# Patient Record
Sex: Female | Born: 1935
Health system: Southern US, Community
[De-identification: ages and names within clinical notes are randomized; demographics above are authoritative.]

## PROBLEM LIST (undated history)

## (undated) DIAGNOSIS — R32 Unspecified urinary incontinence: Secondary | ICD-10-CM

## (undated) DIAGNOSIS — K219 Gastro-esophageal reflux disease without esophagitis: Secondary | ICD-10-CM

## (undated) DIAGNOSIS — F419 Anxiety disorder, unspecified: Secondary | ICD-10-CM

## (undated) DIAGNOSIS — R0602 Shortness of breath: Secondary | ICD-10-CM

## (undated) DIAGNOSIS — G4730 Sleep apnea, unspecified: Secondary | ICD-10-CM

## (undated) DIAGNOSIS — M797 Fibromyalgia: Secondary | ICD-10-CM

## (undated) DIAGNOSIS — R112 Nausea with vomiting, unspecified: Secondary | ICD-10-CM

## (undated) DIAGNOSIS — J40 Bronchitis, not specified as acute or chronic: Secondary | ICD-10-CM

## (undated) DIAGNOSIS — R42 Dizziness and giddiness: Secondary | ICD-10-CM

## (undated) DIAGNOSIS — K589 Irritable bowel syndrome without diarrhea: Secondary | ICD-10-CM

## (undated) DIAGNOSIS — N289 Disorder of kidney and ureter, unspecified: Secondary | ICD-10-CM

## (undated) DIAGNOSIS — E039 Hypothyroidism, unspecified: Secondary | ICD-10-CM

## (undated) DIAGNOSIS — M1990 Unspecified osteoarthritis, unspecified site: Secondary | ICD-10-CM

## (undated) DIAGNOSIS — Z9889 Other specified postprocedural states: Secondary | ICD-10-CM

## (undated) DIAGNOSIS — D649 Anemia, unspecified: Secondary | ICD-10-CM

## (undated) DIAGNOSIS — J189 Pneumonia, unspecified organism: Secondary | ICD-10-CM

## (undated) DIAGNOSIS — R3915 Urgency of urination: Secondary | ICD-10-CM

## (undated) DIAGNOSIS — G43909 Migraine, unspecified, not intractable, without status migrainosus: Secondary | ICD-10-CM

## (undated) DIAGNOSIS — E109 Type 1 diabetes mellitus without complications: Secondary | ICD-10-CM

## (undated) DIAGNOSIS — I10 Essential (primary) hypertension: Secondary | ICD-10-CM

## (undated) DIAGNOSIS — E079 Disorder of thyroid, unspecified: Secondary | ICD-10-CM

## (undated) DIAGNOSIS — M810 Age-related osteoporosis without current pathological fracture: Secondary | ICD-10-CM

## (undated) DIAGNOSIS — R5382 Chronic fatigue, unspecified: Secondary | ICD-10-CM

## (undated) HISTORY — DX: Type 1 diabetes mellitus without complications: E10.9

## (undated) HISTORY — DX: Age-related osteoporosis without current pathological fracture: M81.0

## (undated) HISTORY — PX: JOINT REPLACEMENT: SHX530

## (undated) HISTORY — PX: COLONOSCOPY W/ POLYPECTOMY: SHX1380

## (undated) HISTORY — PX: ABDOMINAL HYSTERECTOMY: SHX81

## (undated) HISTORY — DX: Disorder of kidney and ureter, unspecified: N28.9

## (undated) HISTORY — PX: DILATION AND CURETTAGE OF UTERUS: SHX78

## (undated) HISTORY — PX: EYE SURGERY: SHX253

## (undated) HISTORY — PX: CHOLECYSTECTOMY: SHX55

## (undated) HISTORY — PX: TONSILLECTOMY: SUR1361

## (undated) HISTORY — PX: APPENDECTOMY: SHX54

---

## 2004-02-17 ENCOUNTER — Other Ambulatory Visit: Admission: RE | Admit: 2004-02-17 | Discharge: 2004-02-17 | Payer: Self-pay | Admitting: Dermatology

## 2005-12-17 ENCOUNTER — Ambulatory Visit: Payer: Self-pay | Admitting: Gastroenterology

## 2005-12-27 ENCOUNTER — Ambulatory Visit (HOSPITAL_COMMUNITY): Admission: RE | Admit: 2005-12-27 | Discharge: 2005-12-27 | Payer: Self-pay | Admitting: Ophthalmology

## 2011-02-01 DIAGNOSIS — I517 Cardiomegaly: Secondary | ICD-10-CM

## 2011-02-01 DIAGNOSIS — R072 Precordial pain: Secondary | ICD-10-CM

## 2011-05-28 DIAGNOSIS — G619 Inflammatory polyneuropathy, unspecified: Secondary | ICD-10-CM | POA: Insufficient documentation

## 2011-09-11 ENCOUNTER — Encounter: Payer: Self-pay | Admitting: *Deleted

## 2011-09-11 ENCOUNTER — Emergency Department (HOSPITAL_COMMUNITY)
Admission: EM | Admit: 2011-09-11 | Discharge: 2011-09-12 | Disposition: A | Discharge status: Home / Self Care | Payer: Medicare Other | Attending: Emergency Medicine | Admitting: Emergency Medicine

## 2011-09-11 DIAGNOSIS — W19XXXA Unspecified fall, initial encounter: Secondary | ICD-10-CM

## 2011-09-11 DIAGNOSIS — W268XXA Contact with other sharp object(s), not elsewhere classified, initial encounter: Secondary | ICD-10-CM | POA: Insufficient documentation

## 2011-09-11 DIAGNOSIS — W010XXA Fall on same level from slipping, tripping and stumbling without subsequent striking against object, initial encounter: Secondary | ICD-10-CM | POA: Insufficient documentation

## 2011-09-11 DIAGNOSIS — Z79899 Other long term (current) drug therapy: Secondary | ICD-10-CM | POA: Insufficient documentation

## 2011-09-11 DIAGNOSIS — IMO0002 Reserved for concepts with insufficient information to code with codable children: Secondary | ICD-10-CM

## 2011-09-11 DIAGNOSIS — M542 Cervicalgia: Secondary | ICD-10-CM | POA: Insufficient documentation

## 2011-09-11 DIAGNOSIS — S0990XA Unspecified injury of head, initial encounter: Secondary | ICD-10-CM | POA: Insufficient documentation

## 2011-09-11 DIAGNOSIS — W01119A Fall on same level from slipping, tripping and stumbling with subsequent striking against unspecified sharp object, initial encounter: Secondary | ICD-10-CM | POA: Insufficient documentation

## 2011-09-11 DIAGNOSIS — S0100XA Unspecified open wound of scalp, initial encounter: Secondary | ICD-10-CM | POA: Insufficient documentation

## 2011-09-11 DIAGNOSIS — E119 Type 2 diabetes mellitus without complications: Secondary | ICD-10-CM | POA: Insufficient documentation

## 2011-09-11 HISTORY — DX: Chronic fatigue, unspecified: R53.82

## 2011-09-11 HISTORY — DX: Fibromyalgia: M79.7

## 2011-09-11 MED ORDER — ACETAMINOPHEN 500 MG PO TABS: 1000.0000 mg | ORAL_TABLET | Freq: Once | ORAL | Status: DC

## 2011-09-11 NOTE — ED Notes (Signed)
Pt tripped over her dog. Pt hit head on a corner of sink.

## 2011-09-11 NOTE — ED Notes (Signed)
Wound cleaned stapler to bedside. Pt stating no loc

## 2011-09-12 ENCOUNTER — Emergency Department (HOSPITAL_COMMUNITY): Payer: Medicare Other

## 2011-09-12 MED ORDER — LIDOCAINE HCL (PF) 1 % IJ SOLN
INTRAMUSCULAR | Status: AC
  Filled 2011-09-12: qty 5

## 2011-09-12 MED ORDER — LIDOCAINE HCL (PF) 1 % IJ SOLN: 5.0000 mL | Freq: Once | INTRAMUSCULAR | Status: DC

## 2011-09-12 NOTE — ED Provider Notes (Signed)
History     CSN: [id number] Arrival date & time: 09/11/2011 11:25 PM  Chief Complaint  Patient presents with  . Laceration  . Neck Pain    (Consider location/radiation/quality/duration/timing/severity/associated sxs/prior treatment) HPI Comments: Examined at 0001.  Patient is a 75 y.o. female presenting with skin laceration and neck pain. The history is provided by the patient.  Laceration  The incident occurred less than 1 hour ago. The laceration is located on the scalp. The laceration is 6 cm in size. Injury mechanism: tripped over her dog and fell hitting the back of her head on the edge of the wall near her sink. The pain is at a severity of 6/10. The pain is moderate. The pain has been constant since onset. She reports no foreign bodies present. Her tetanus status is UTD.  Neck Pain     Past Medical History  Diagnosis Date  . Diabetes mellitus   . Fibromyalgia   . Chronic fatigue     Past Surgical History  Procedure Date  . Appendectomy   . Abdominal hysterectomy   . Cholecystectomy     History reviewed. No pertinent family history.  History  Substance Use Topics  . Smoking status: Former Games developer  . Smokeless tobacco: Not on file  . Alcohol Use: No    OB History    Grav Para Term Preterm Abortions TAB SAB Ect Mult Living                  Review of Systems  HENT: Positive for neck pain.   All other systems reviewed and are negative.    Allergies  Cleocin; Glucophage; Macrodantin; Prednisone; and Sulfa antibiotics  Home Medications   Current Outpatient Rx  Name Route Sig Dispense Refill  . ASPIRIN 81 MG PO TABS Oral Take 81 mg by mouth daily.      Marland Kitchen GLIMEPIRIDE 2 MG PO TABS Oral Take 2 mg by mouth daily before breakfast.      . LEVOTHYROXINE SODIUM 75 MCG PO TABS Oral Take 75 mcg by mouth daily.      Marland Kitchen LOSARTAN POTASSIUM 25 MG PO TABS Oral Take 25 mg by mouth daily.      Marland Kitchen METFORMIN HCL 500 MG (MOD) PO TB24 Oral Take 500 mg by mouth daily with  breakfast.      . METOPROLOL TARTRATE 25 MG PO TABS Oral Take 25 mg by mouth 2 (two) times daily.      Marland Kitchen RABEPRAZOLE SODIUM 20 MG PO TBEC Oral Take 20 mg by mouth daily.        BP 140/71  Pulse 71  Resp 20  Ht 5\' 5"  (1.651 m)  Wt 175 lb (79.379 kg)  BMI 29.12 kg/m2  SpO2 99%  Physical Exam  Nursing note and vitals reviewed. Constitutional: She is oriented to person, place, and time. She appears well-developed and well-nourished.  HENT:  Head: Normocephalic.  Right Ear: External ear normal.  Left Ear: External ear normal.  Mouth/Throat: Oropharynx is clear and moist.       6 cm  Vertical laceration to occiput, bleeding controlled.  Eyes: EOM are normal. Pupils are equal, round, and reactive to light.  Neck: Normal range of motion. Neck supple. No thyromegaly present.  Cardiovascular: Normal rate, normal heart sounds and intact distal pulses.   Pulmonary/Chest: Effort normal.  Abdominal: Soft.  Musculoskeletal: Normal range of motion.  Lymphadenopathy:    She has no cervical adenopathy.  Neurological: She is alert and oriented to person,  place, and time. No cranial nerve deficit. Coordination normal.  Skin: Skin is warm and dry.    ED Course  LACERATION REPAIR Performed by: Annamarie Dawley Authorized by: Annamarie Dawley Consent: Verbal consent obtained. Risks and benefits: risks, benefits and alternatives were discussed Consent given by: patient Patient understanding: patient states understanding of the procedure being performed Imaging studies: imaging studies available Patient identity confirmed: verbally with patient and arm band Time out: Immediately prior to procedure a "time out" was called to verify the correct patient, procedure, equipment, support staff and site/side marked as required. Body area: head/neck Location details: scalp Laceration length: 6 cm Tendon involvement: none Nerve involvement: none Anesthesia: local infiltration Local anesthetic:  lidocaine 1% without epinephrine Patient sedated: no Irrigation solution: saline Amount of cleaning: standard Debridement: none Degree of undermining: none Patient tolerance: Patient tolerated the procedure well with no immediate complications. Comments: Staples x 4 placed   (including critical care time)  Labs Reviewed - No data to display Ct Head Wo Contrast  09/12/2011  *RADIOLOGY REPORT*  Clinical Data:  Fall, laceration to back of head, headache, neck pain  CT HEAD WITHOUT CONTRAST CT CERVICAL SPINE WITHOUT CONTRAST  Technique:  Multidetector CT imaging of the head and cervical spine was performed following the standard protocol without intravenous contrast.  Multiplanar CT image reconstructions of the cervical spine were also generated.  Comparison:  None.  CT HEAD  Findings: No evidence of parenchymal hemorrhage or extra-axial fluid collection. No mass lesion, mass effect, or midline shift.  No CT evidence of acute infarction.  Cerebral volume is age appropriate.  No ventriculomegaly.  Intracranial atherosclerosis.  The visualized paranasal sinuses are essentially clear. The mastoid air cells are unopacified.  Mild soft tissue laceration overlying the left occiput (series 2/image 13).  No underlying osseous abnormality.  No evidence of calvarial fracture.  IMPRESSION: No evidence of acute intracranial abnormality.  CT CERVICAL SPINE  Findings: Normal cervical lordosis.  No evidence of fracture or dislocation.  The vertebral body heights are maintained.  The dens appears intact.  No prevertebral soft tissue swelling.  Mild degenerative changes at C5-6 and C6-7.  Visualized thyroid is unremarkable.  Visualized lung apices are clear.  IMPRESSION: No evidence of traumatic injury to the cervical spine.  Original Report Authenticated By: Charline Bills, M.D.   Ct Cervical Spine Wo Contrast  09/12/2011  *RADIOLOGY REPORT*  Clinical Data:  Fall, laceration to back of head, headache, neck pain  CT  HEAD WITHOUT CONTRAST CT CERVICAL SPINE WITHOUT CONTRAST  Technique:  Multidetector CT imaging of the head and cervical spine was performed following the standard protocol without intravenous contrast.  Multiplanar CT image reconstructions of the cervical spine were also generated.  Comparison:  None.  CT HEAD  Findings: No evidence of parenchymal hemorrhage or extra-axial fluid collection. No mass lesion, mass effect, or midline shift.  No CT evidence of acute infarction.  Cerebral volume is age appropriate.  No ventriculomegaly.  Intracranial atherosclerosis.  The visualized paranasal sinuses are essentially clear. The mastoid air cells are unopacified.  Mild soft tissue laceration overlying the left occiput (series 2/image 13).  No underlying osseous abnormality.  No evidence of calvarial fracture.  IMPRESSION: No evidence of acute intracranial abnormality.  CT CERVICAL SPINE  Findings: Normal cervical lordosis.  No evidence of fracture or dislocation.  The vertebral body heights are maintained.  The dens appears intact.  No prevertebral soft tissue swelling.  Mild degenerative changes at C5-6 and C6-7.  Visualized thyroid is unremarkable.  Visualized lung apices are clear.  IMPRESSION: No evidence of traumatic injury to the cervical spine.  Original Report Authenticated By: Charline Bills, M.D.     No diagnosis found.  Patient fell tripping over her dog striking her head on the edge of a sink cabinet. No LOC. Sustained a laceration to the occiput. CT scans of head and cervical spine unremarkable. Laceration repaired.  MDM Reviewed: nursing note and vitals Interpretation: CT scan              Nicoletta Dress. Colon Branch, MD 09/12/11 8119

## 2011-09-12 NOTE — ED Notes (Signed)
Pt left the er stating no needs 

## 2011-09-22 DIAGNOSIS — S0100XA Unspecified open wound of scalp, initial encounter: Secondary | ICD-10-CM | POA: Insufficient documentation

## 2011-12-03 DIAGNOSIS — R0602 Shortness of breath: Secondary | ICD-10-CM | POA: Insufficient documentation

## 2012-02-12 DIAGNOSIS — D485 Neoplasm of uncertain behavior of skin: Secondary | ICD-10-CM | POA: Insufficient documentation

## 2012-04-17 DIAGNOSIS — R059 Cough, unspecified: Secondary | ICD-10-CM | POA: Insufficient documentation

## 2012-06-19 ENCOUNTER — Encounter (HOSPITAL_COMMUNITY): Payer: Self-pay | Admitting: Pharmacy Technician

## 2012-06-22 ENCOUNTER — Encounter (HOSPITAL_COMMUNITY)
Admission: RE | Admit: 2012-06-22 | Discharge: 2012-06-22 | Disposition: A | Discharge status: Home / Self Care | Payer: Medicare Other | Source: Ambulatory Visit | Attending: Orthopedic Surgery | Admitting: Orthopedic Surgery

## 2012-06-22 ENCOUNTER — Encounter (HOSPITAL_COMMUNITY)
Admission: RE | Admit: 2012-06-22 | Discharge: 2012-06-22 | Disposition: A | Discharge status: Home / Self Care | Payer: Medicare Other | Source: Ambulatory Visit | Attending: Anesthesiology | Admitting: Anesthesiology

## 2012-06-22 ENCOUNTER — Encounter (HOSPITAL_COMMUNITY): Payer: Self-pay

## 2012-06-22 HISTORY — DX: Irritable bowel syndrome, unspecified: K58.9

## 2012-06-22 HISTORY — DX: Disorder of thyroid, unspecified: E07.9

## 2012-06-22 HISTORY — DX: Gastro-esophageal reflux disease without esophagitis: K21.9

## 2012-06-22 HISTORY — DX: Migraine, unspecified, not intractable, without status migrainosus: G43.909

## 2012-06-22 HISTORY — DX: Pneumonia, unspecified organism: J18.9

## 2012-06-22 HISTORY — DX: Sleep apnea, unspecified: G47.30

## 2012-06-22 HISTORY — DX: Unspecified urinary incontinence: R32

## 2012-06-22 HISTORY — DX: Bronchitis, not specified as acute or chronic: J40

## 2012-06-22 HISTORY — DX: Essential (primary) hypertension: I10

## 2012-06-22 HISTORY — DX: Nausea with vomiting, unspecified: R11.2

## 2012-06-22 HISTORY — DX: Anemia, unspecified: D64.9

## 2012-06-22 HISTORY — DX: Other specified postprocedural states: Z98.890

## 2012-06-22 LAB — CBC
HCT: 35.4 % — ABNORMAL LOW (ref 36.0–46.0)
MCV: 82.1 fL (ref 78.0–100.0)
RBC: 4.31 MIL/uL (ref 3.87–5.11)
WBC: 7.3 10*3/uL (ref 4.0–10.5)

## 2012-06-22 LAB — TYPE AND SCREEN

## 2012-06-22 LAB — SURGICAL PCR SCREEN: MRSA, PCR: NEGATIVE

## 2012-06-22 LAB — ABO/RH: ABO/RH(D): A POS

## 2012-06-22 LAB — BASIC METABOLIC PANEL
CO2: 26 mEq/L (ref 19–32)
Chloride: 100 mEq/L (ref 96–112)
Sodium: 138 mEq/L (ref 135–145)

## 2012-06-22 NOTE — Pre-Procedure Instructions (Signed)
20 Angelica Wood  06/22/2012   Your procedure is scheduled on:  Thursday June 29, 2012  Report to [facility] Short Stay Center at 5:30 AM.  Call this number if you have problems the morning of surgery: [phone]   Remember:   Do not eat food  Or drink :After Midnight.    Take these medicines the morning of surgery with A SIP OF WATER: cymbalta, synthroid, claritin, metoprolol, percocet, aciphex   Do not wear jewelry, make-up or nail polish.  Do not wear lotions, powders, or perfumes. You may wear deodorant.  Do not shave 48 hours prior to surgery. Men may shave face and neck.  Do not bring valuables to the hospital.  Contacts, dentures or bridgework may not be worn into surgery.  Leave suitcase in the car. After surgery it may be brought to your room.  For patients admitted to the hospital, checkout time is 11:00 AM the day of discharge.   Patients discharged the day of surgery will not be allowed to drive home.  Name and phone number of your driver: Venida Jarvis [phone]  Special Instructions: Incentive Spirometry - Practice and bring it with you on the day of surgery. and CHG Shower Use Special Wash: 1/2 bottle night before surgery and 1/2 bottle morning of surgery.   Please read over the following fact sheets that you were given: Pain Booklet, Coughing and Deep Breathing, Blood Transfusion Information, MRSA Information and Surgical Site Infection Prevention

## 2012-06-22 NOTE — Progress Notes (Signed)
Requested sleep study from [facility].

## 2012-06-26 NOTE — Consult Note (Signed)
Anesthesia Chart Review:  Patient is a 76 year old female scheduled for a left total shoulder on 06/29/12 by Dr. Rennis Chris.  History includes OSA, post-operative N/V, former smoker, DM2, HTN, IBS, migraines, anemia, chronic fatigue, GERD, fibromyalgia, urinary incontinence, PNA, hypothyroidism.  Labs noted.  Non-fasting glucose is 237.  H/H 11.2/35.4.  She will get a CBG on arrival.  There were no acute findings on CXR from 06/22/12.    EKG on 06/22/12 showed NSR.  Plan to proceed.  Shonna Chock, PA-C

## 2012-06-28 MED ORDER — DEXTROSE 5 % IV SOLN
3.0000 g | INTRAVENOUS | Status: AC
  Administered 2012-06-29: 3 g via INTRAVENOUS
  Filled 2012-06-28: qty 30

## 2012-06-28 MED ORDER — CHLORHEXIDINE GLUCONATE 4 % EX LIQD: 60.0000 mL | Freq: Once | CUTANEOUS | Status: DC

## 2012-06-29 ENCOUNTER — Encounter (HOSPITAL_COMMUNITY): Payer: Self-pay | Admitting: Vascular Surgery

## 2012-06-29 ENCOUNTER — Inpatient Hospital Stay (HOSPITAL_COMMUNITY)
Admission: RE | Admit: 2012-06-29 | Discharge: 2012-06-30 | DRG: 484 | Disposition: A | Discharge status: Home / Self Care | Payer: Medicare Other | Source: Ambulatory Visit | Attending: Orthopedic Surgery | Admitting: Orthopedic Surgery

## 2012-06-29 ENCOUNTER — Encounter (HOSPITAL_COMMUNITY)
Admission: RE | Disposition: A | Discharge status: Home / Self Care | Payer: Self-pay | Source: Ambulatory Visit | Attending: Orthopedic Surgery

## 2012-06-29 ENCOUNTER — Ambulatory Visit (HOSPITAL_COMMUNITY): Payer: Medicare Other | Admitting: Vascular Surgery

## 2012-06-29 DIAGNOSIS — K219 Gastro-esophageal reflux disease without esophagitis: Secondary | ICD-10-CM | POA: Diagnosis present

## 2012-06-29 DIAGNOSIS — E119 Type 2 diabetes mellitus without complications: Secondary | ICD-10-CM | POA: Diagnosis present

## 2012-06-29 DIAGNOSIS — F411 Generalized anxiety disorder: Secondary | ICD-10-CM | POA: Diagnosis present

## 2012-06-29 DIAGNOSIS — I10 Essential (primary) hypertension: Secondary | ICD-10-CM | POA: Diagnosis present

## 2012-06-29 DIAGNOSIS — G4730 Sleep apnea, unspecified: Secondary | ICD-10-CM | POA: Diagnosis present

## 2012-06-29 DIAGNOSIS — Z01818 Encounter for other preprocedural examination: Secondary | ICD-10-CM

## 2012-06-29 DIAGNOSIS — Z87891 Personal history of nicotine dependence: Secondary | ICD-10-CM

## 2012-06-29 DIAGNOSIS — IMO0001 Reserved for inherently not codable concepts without codable children: Secondary | ICD-10-CM | POA: Diagnosis present

## 2012-06-29 DIAGNOSIS — Z888 Allergy status to other drugs, medicaments and biological substances status: Secondary | ICD-10-CM

## 2012-06-29 DIAGNOSIS — M19019 Primary osteoarthritis, unspecified shoulder: Principal | ICD-10-CM

## 2012-06-29 DIAGNOSIS — Z882 Allergy status to sulfonamides status: Secondary | ICD-10-CM

## 2012-06-29 HISTORY — PX: TOTAL SHOULDER ARTHROPLASTY: SHX126

## 2012-06-29 LAB — GLUCOSE, CAPILLARY
Glucose-Capillary: 135 mg/dL — ABNORMAL HIGH (ref 70–99)
Glucose-Capillary: 138 mg/dL — ABNORMAL HIGH (ref 70–99)
Glucose-Capillary: 97 mg/dL (ref 70–99)
Glucose-Capillary: 137 mg/dL — ABNORMAL HIGH (ref 70–99)
Glucose-Capillary: 271 mg/dL — ABNORMAL HIGH (ref 70–99)

## 2012-06-29 SURGERY — ARTHROPLASTY, SHOULDER, TOTAL
Anesthesia: General | Site: Shoulder | Laterality: Left | Wound class: Clean

## 2012-06-29 MED ORDER — METOCLOPRAMIDE HCL 10 MG PO TABS: 5.0000 mg | ORAL_TABLET | Freq: Three times a day (TID) | ORAL | Status: DC | PRN

## 2012-06-29 MED ORDER — LOSARTAN POTASSIUM-HCTZ 100-12.5 MG PO TABS: 1.0000 | ORAL_TABLET | Freq: Every day | ORAL | Status: DC

## 2012-06-29 MED ORDER — NEOSTIGMINE METHYLSULFATE 1 MG/ML IJ SOLN
INTRAMUSCULAR | Status: DC | PRN
  Administered 2012-06-29: 2 mg via INTRAVENOUS

## 2012-06-29 MED ORDER — OXYCODONE-ACETAMINOPHEN 5-325 MG PO TABS
1.0000 | ORAL_TABLET | ORAL | Status: DC | PRN
  Administered 2012-06-29: 2 via ORAL
  Administered 2012-06-30 (×2): 1 via ORAL
  Filled 2012-06-29 (×2): qty 1
  Filled 2012-06-29: qty 2

## 2012-06-29 MED ORDER — MENTHOL 3 MG MT LOZG
1.0000 | LOZENGE | OROMUCOSAL | Status: DC | PRN
  Filled 2012-06-29: qty 9

## 2012-06-29 MED ORDER — MUPIROCIN 2 % EX OINT
TOPICAL_OINTMENT | Freq: Two times a day (BID) | CUTANEOUS | Status: DC
  Administered 2012-06-29 – 2012-06-30 (×3): via NASAL
  Filled 2012-06-29: qty 22

## 2012-06-29 MED ORDER — ONDANSETRON HCL 4 MG/2ML IJ SOLN
INTRAMUSCULAR | Status: DC | PRN
  Administered 2012-06-29: 4 mg via INTRAVENOUS

## 2012-06-29 MED ORDER — ONDANSETRON HCL 4 MG/2ML IJ SOLN: 4.0000 mg | Freq: Once | INTRAMUSCULAR | Status: DC | PRN

## 2012-06-29 MED ORDER — PHENOL 1.4 % MT LIQD
1.0000 | OROMUCOSAL | Status: DC | PRN
Start: 2012-06-29 — End: 2012-06-30

## 2012-06-29 MED ORDER — HYDROCHLOROTHIAZIDE 12.5 MG PO CAPS
12.5000 mg | ORAL_CAPSULE | Freq: Every day | ORAL | Status: DC
  Administered 2012-06-30: 12.5 mg via ORAL
  Filled 2012-06-29: qty 1

## 2012-06-29 MED ORDER — ZOLPIDEM TARTRATE 5 MG PO TABS: 5.0000 mg | ORAL_TABLET | Freq: Every evening | ORAL | Status: DC | PRN

## 2012-06-29 MED ORDER — DULOXETINE HCL 60 MG PO CPEP
60.0000 mg | ORAL_CAPSULE | Freq: Two times a day (BID) | ORAL | Status: DC
  Administered 2012-06-29 – 2012-06-30 (×2): 60 mg via ORAL
  Filled 2012-06-29 (×3): qty 1

## 2012-06-29 MED ORDER — METOCLOPRAMIDE HCL 5 MG/ML IJ SOLN
5.0000 mg | Freq: Three times a day (TID) | INTRAMUSCULAR | Status: DC | PRN
Start: 2012-06-29 — End: 2012-06-30

## 2012-06-29 MED ORDER — PHENYLEPHRINE HCL 10 MG/ML IJ SOLN
INTRAMUSCULAR | Status: DC | PRN
  Administered 2012-06-29 (×2): 120 ug via INTRAVENOUS
  Administered 2012-06-29: 80 ug via INTRAVENOUS

## 2012-06-29 MED ORDER — INSULIN ASPART 100 UNIT/ML ~~LOC~~ SOLN
0.0000 [IU] | Freq: Three times a day (TID) | SUBCUTANEOUS | Status: DC
  Administered 2012-06-29: 8 [IU] via SUBCUTANEOUS
  Administered 2012-06-30: 2 [IU] via SUBCUTANEOUS

## 2012-06-29 MED ORDER — LIDOCAINE HCL (CARDIAC) 20 MG/ML IV SOLN
INTRAVENOUS | Status: DC | PRN
  Administered 2012-06-29: 80 mg via INTRAVENOUS

## 2012-06-29 MED ORDER — METOPROLOL TARTRATE 50 MG PO TABS
50.0000 mg | ORAL_TABLET | Freq: Two times a day (BID) | ORAL | Status: DC
  Administered 2012-06-29 – 2012-06-30 (×2): 50 mg via ORAL
  Filled 2012-06-29 (×3): qty 1

## 2012-06-29 MED ORDER — LEVOTHYROXINE SODIUM 100 MCG PO TABS
100.0000 ug | ORAL_TABLET | Freq: Every day | ORAL | Status: DC
  Administered 2012-06-30: 100 ug via ORAL
  Filled 2012-06-29 (×2): qty 1

## 2012-06-29 MED ORDER — GLYCOPYRROLATE 0.2 MG/ML IJ SOLN
INTRAMUSCULAR | Status: DC | PRN
  Administered 2012-06-29: 0.2 mg via INTRAVENOUS
  Administered 2012-06-29: 0.3 mg via INTRAVENOUS

## 2012-06-29 MED ORDER — LORATADINE 10 MG PO TABS
10.0000 mg | ORAL_TABLET | Freq: Every day | ORAL | Status: DC
  Administered 2012-06-30: 10 mg via ORAL
  Filled 2012-06-29: qty 1

## 2012-06-29 MED ORDER — GLIMEPIRIDE 4 MG PO TABS
4.0000 mg | ORAL_TABLET | Freq: Every day | ORAL | Status: DC
  Administered 2012-06-30: 4 mg via ORAL
  Filled 2012-06-29 (×2): qty 1

## 2012-06-29 MED ORDER — LACTATED RINGERS IV SOLN: INTRAVENOUS | Status: DC

## 2012-06-29 MED ORDER — CHLORHEXIDINE GLUCONATE CLOTH 2 % EX PADS: 6.0000 | MEDICATED_PAD | Freq: Every day | CUTANEOUS | Status: DC

## 2012-06-29 MED ORDER — MIDAZOLAM HCL 5 MG/5ML IJ SOLN
INTRAMUSCULAR | Status: DC | PRN
  Administered 2012-06-29: 2 mg via INTRAVENOUS

## 2012-06-29 MED ORDER — HYDROMORPHONE HCL PF 1 MG/ML IJ SOLN: 0.2500 mg | INTRAMUSCULAR | Status: DC | PRN

## 2012-06-29 MED ORDER — LOSARTAN POTASSIUM 50 MG PO TABS
100.0000 mg | ORAL_TABLET | Freq: Every day | ORAL | Status: DC
  Administered 2012-06-30: 100 mg via ORAL
  Filled 2012-06-29: qty 2

## 2012-06-29 MED ORDER — PANTOPRAZOLE SODIUM 40 MG PO TBEC
40.0000 mg | DELAYED_RELEASE_TABLET | Freq: Every day | ORAL | Status: DC
  Administered 2012-06-30: 40 mg via ORAL
  Filled 2012-06-29: qty 1

## 2012-06-29 MED ORDER — ACETAMINOPHEN 325 MG PO TABS: 650.0000 mg | ORAL_TABLET | Freq: Four times a day (QID) | ORAL | Status: DC | PRN

## 2012-06-29 MED ORDER — DIPHENHYDRAMINE HCL 12.5 MG/5ML PO ELIX: 12.5000 mg | ORAL_SOLUTION | ORAL | Status: DC | PRN

## 2012-06-29 MED ORDER — LACTATED RINGERS IV SOLN
INTRAVENOUS | Status: DC | PRN
  Administered 2012-06-29 (×2): via INTRAVENOUS

## 2012-06-29 MED ORDER — ACETAMINOPHEN 650 MG RE SUPP: 650.0000 mg | Freq: Four times a day (QID) | RECTAL | Status: DC | PRN

## 2012-06-29 MED ORDER — CEFAZOLIN SODIUM 1-5 GM-% IV SOLN
1.0000 g | Freq: Four times a day (QID) | INTRAVENOUS | Status: AC
  Administered 2012-06-29 – 2012-06-30 (×3): 1 g via INTRAVENOUS
  Filled 2012-06-29 (×3): qty 50

## 2012-06-29 MED ORDER — KETOROLAC TROMETHAMINE 15 MG/ML IJ SOLN
15.0000 mg | Freq: Four times a day (QID) | INTRAMUSCULAR | Status: DC
  Administered 2012-06-29 – 2012-06-30 (×4): 15 mg via INTRAVENOUS
  Filled 2012-06-29 (×7): qty 1

## 2012-06-29 MED ORDER — ONDANSETRON HCL 4 MG/2ML IJ SOLN: 4.0000 mg | Freq: Four times a day (QID) | INTRAMUSCULAR | Status: DC | PRN

## 2012-06-29 MED ORDER — DOCUSATE SODIUM 100 MG PO CAPS
100.0000 mg | ORAL_CAPSULE | Freq: Two times a day (BID) | ORAL | Status: DC
  Administered 2012-06-29 – 2012-06-30 (×3): 100 mg via ORAL
  Filled 2012-06-29 (×3): qty 1

## 2012-06-29 MED ORDER — ROCURONIUM BROMIDE 100 MG/10ML IV SOLN
INTRAVENOUS | Status: DC | PRN
  Administered 2012-06-29: 30 mg via INTRAVENOUS

## 2012-06-29 MED ORDER — HYDROMORPHONE HCL PF 1 MG/ML IJ SOLN
0.5000 mg | INTRAMUSCULAR | Status: DC | PRN
  Administered 2012-06-30 (×2): 1 mg via INTRAVENOUS
  Filled 2012-06-29 (×2): qty 1

## 2012-06-29 MED ORDER — ONDANSETRON HCL 4 MG PO TABS: 4.0000 mg | ORAL_TABLET | Freq: Four times a day (QID) | ORAL | Status: DC | PRN

## 2012-06-29 MED ORDER — FENTANYL CITRATE 0.05 MG/ML IJ SOLN
INTRAMUSCULAR | Status: DC | PRN
  Administered 2012-06-29 (×3): 50 ug via INTRAVENOUS

## 2012-06-29 MED ORDER — SODIUM CHLORIDE 0.9 % IV SOLN
10.0000 mg | INTRAVENOUS | Status: DC | PRN
  Administered 2012-06-29: 10 ug/min via INTRAVENOUS

## 2012-06-29 MED ORDER — BUPIVACAINE-EPINEPHRINE PF 0.5-1:200000 % IJ SOLN
INTRAMUSCULAR | Status: DC | PRN
  Administered 2012-06-29: 30 mL

## 2012-06-29 MED ORDER — LORAZEPAM 0.5 MG PO TABS: 0.5000 mg | ORAL_TABLET | Freq: Three times a day (TID) | ORAL | Status: DC | PRN

## 2012-06-29 MED ORDER — PROPOFOL 10 MG/ML IV EMUL
INTRAVENOUS | Status: DC | PRN
  Administered 2012-06-29: 130 mg via INTRAVENOUS

## 2012-06-29 MED ORDER — SODIUM CHLORIDE 0.9 % IR SOLN
Status: DC | PRN
  Administered 2012-06-29: 1000 mL

## 2012-06-29 SURGICAL SUPPLY — 61 items
BLADE SAW SGTL 83.5X18.5 (BLADE) ×2 IMPLANT
CEMENT BONE DEPUY (Cement) ×2 IMPLANT
CLOTH BEACON ORANGE TIMEOUT ST (SAFETY) ×2 IMPLANT
CLSR STERI-STRIP ANTIMIC 1/2X4 (GAUZE/BANDAGES/DRESSINGS) ×2 IMPLANT
COVER SURGICAL LIGHT HANDLE (MISCELLANEOUS) ×2 IMPLANT
DRAPE INCISE IOBAN 66X45 STRL (DRAPES) ×2 IMPLANT
DRAPE SURG 17X11 SM STRL (DRAPES) ×2 IMPLANT
DRAPE SURG 17X23 STRL (DRAPES) ×2 IMPLANT
DRAPE U-SHAPE 47X51 STRL (DRAPES) ×2 IMPLANT
DRILL BIT 7/64X5 (BIT) IMPLANT
DRSG AQUACEL AG ADV 3.5X10 (GAUZE/BANDAGES/DRESSINGS) IMPLANT
DRSG MEPILEX BORDER 4X8 (GAUZE/BANDAGES/DRESSINGS) IMPLANT
DURAPREP 26ML APPLICATOR (WOUND CARE) ×2 IMPLANT
ELECT BLADE 4.0 EZ CLEAN MEGAD (MISCELLANEOUS) ×2
ELECT CAUTERY BLADE 6.4 (BLADE) ×2 IMPLANT
ELECT REM PT RETURN 9FT ADLT (ELECTROSURGICAL) ×2
ELECTRODE BLDE 4.0 EZ CLN MEGD (MISCELLANEOUS) ×1 IMPLANT
ELECTRODE REM PT RTRN 9FT ADLT (ELECTROSURGICAL) ×1 IMPLANT
FACESHIELD LNG OPTICON STERILE (SAFETY) ×6 IMPLANT
GLOVE BIO SURGEON STRL SZ7.5 (GLOVE) ×2 IMPLANT
GLOVE BIO SURGEON STRL SZ8 (GLOVE) ×2 IMPLANT
GLOVE BIO SURGEON STRL SZ8.5 (GLOVE) ×2 IMPLANT
GLOVE EUDERMIC 7 POWDERFREE (GLOVE) ×2 IMPLANT
GLOVE SS BIOGEL STRL SZ 7.5 (GLOVE) ×1 IMPLANT
GLOVE SUPERSENSE BIOGEL SZ 7.5 (GLOVE) ×1
GLOVE SURG SS PI 8.5 STRL IVOR (GLOVE) ×1
GLOVE SURG SS PI 8.5 STRL STRW (GLOVE) ×1 IMPLANT
GOWN STRL NON-REIN LRG LVL3 (GOWN DISPOSABLE) IMPLANT
GOWN STRL REIN XL XLG (GOWN DISPOSABLE) ×6 IMPLANT
KIT BASIN OR (CUSTOM PROCEDURE TRAY) ×2 IMPLANT
KIT ROOM TURNOVER OR (KITS) ×2 IMPLANT
MANIFOLD NEPTUNE II (INSTRUMENTS) ×2 IMPLANT
NDL SUT 6 .5 CRC .975X.05 MAYO (NEEDLE) ×1 IMPLANT
NEEDLE HYPO 25GX1X1/2 BEV (NEEDLE) IMPLANT
NEEDLE MAYO TAPER (NEEDLE) ×1
NS IRRIG 1000ML POUR BTL (IV SOLUTION) ×2 IMPLANT
PACK SHOULDER (CUSTOM PROCEDURE TRAY) ×2 IMPLANT
PAD ARMBOARD 7.5X6 YLW CONV (MISCELLANEOUS) ×4 IMPLANT
PASSER SUT SWANSON 36MM LOOP (INSTRUMENTS) IMPLANT
PIN METAGLENE 2.5 (PIN) IMPLANT
SLING ARM FOAM STRAP MED (SOFTGOODS) ×2 IMPLANT
SLING ARM IMMOBILIZER LRG (SOFTGOODS) IMPLANT
SMARTMIX MINI TOWER (MISCELLANEOUS) ×2
SPONGE LAP 18X18 X RAY DECT (DISPOSABLE) ×2 IMPLANT
SPONGE LAP 4X18 X RAY DECT (DISPOSABLE) ×2 IMPLANT
STRIP CLOSURE SKIN 1/2X4 (GAUZE/BANDAGES/DRESSINGS) IMPLANT
SUCTION FRAZIER TIP 10 FR DISP (SUCTIONS) ×2 IMPLANT
SUT BONE WAX W31G (SUTURE) IMPLANT
SUT FIBERWIRE #2 38 T-5 BLUE (SUTURE) ×4
SUT MNCRL AB 3-0 PS2 18 (SUTURE) ×2 IMPLANT
SUT VIC AB 1 CT1 27 (SUTURE) ×1
SUT VIC AB 1 CT1 27XBRD ANBCTR (SUTURE) ×1 IMPLANT
SUT VIC AB 2-0 CT1 27 (SUTURE) ×1
SUT VIC AB 2-0 CT1 TAPERPNT 27 (SUTURE) ×1 IMPLANT
SUTURE FIBERWR #2 38 T-5 BLUE (SUTURE) ×2 IMPLANT
SYR 30ML SLIP (SYRINGE) IMPLANT
SYR CONTROL 10ML LL (SYRINGE) IMPLANT
TOWEL OR 17X24 6PK STRL BLUE (TOWEL DISPOSABLE) ×2 IMPLANT
TOWEL OR 17X26 10 PK STRL BLUE (TOWEL DISPOSABLE) ×2 IMPLANT
TOWER SMARTMIX MINI (MISCELLANEOUS) ×1 IMPLANT
WATER STERILE IRR 1000ML POUR (IV SOLUTION) IMPLANT

## 2012-06-29 NOTE — Anesthesia Preprocedure Evaluation (Addendum)
Anesthesia Evaluation  Patient identified by MRN, date of birth, ID band Patient awake    Reviewed: Allergy & Precautions, H&P , NPO status , Patient's Chart, lab work & pertinent test results  History of Anesthesia Complications (+) PONV  Airway Mallampati: I TM Distance: >3 FB Neck ROM: full    Dental  (+) Teeth Intact and Dental Advisory Given   Pulmonary sleep apnea ,          Cardiovascular hypertension, Rhythm:regular Rate:Normal     Neuro/Psych  Headaches, PSYCHIATRIC DISORDERS  Neuromuscular disease    GI/Hepatic GERD-  ,  Endo/Other  Type 2, Oral Hypoglycemic Agents  Renal/GU      Musculoskeletal   Abdominal   Peds  Hematology   Anesthesia Other Findings   Reproductive/Obstetrics                          Anesthesia Physical Anesthesia Plan  ASA: III  Anesthesia Plan: General   Post-op Pain Management:    Induction: Intravenous  Airway Management Planned: Oral ETT  Additional Equipment:   Intra-op Plan:   Post-operative Plan: Extubation in OR  Informed Consent: I have reviewed the patients History and Physical, chart, labs and discussed the procedure including the risks, benefits and alternatives for the proposed anesthesia with the patient or authorized representative who has indicated his/her understanding and acceptance.     Plan Discussed with: CRNA, Anesthesiologist and Surgeon  Anesthesia Plan Comments:         Anesthesia Quick Evaluation

## 2012-06-29 NOTE — Preoperative (Signed)
Beta Blockers   Reason not to administer Beta Blockers:Not Applicable, last dose 06/29/12 at 0400

## 2012-06-29 NOTE — Transfer of Care (Signed)
Immediate Anesthesia Transfer of Care Note  Patient: Angelica [name]  Procedure(s) Performed: Procedure(s) (LRB): TOTAL SHOULDER ARTHROPLASTY (Left)  Patient Location: PACU  Anesthesia Type: General  Level of Consciousness: awake, alert  and oriented  Airway & Oxygen Therapy: Patient Spontanous Breathing and Patient connected to nasal cannula oxygen  Post-op Assessment: Report given to PACU RN  Post vital signs: Reviewed and stable  Complications: No apparent anesthesia complications

## 2012-06-29 NOTE — Anesthesia Postprocedure Evaluation (Signed)
  Anesthesia Post-op Note  Patient: Angelica [name]  Procedure(s) Performed: Procedure(s) (LRB): TOTAL SHOULDER ARTHROPLASTY (Left)  Patient Location: PACU  Anesthesia Type: GA combined with regional for post-op pain  Level of Consciousness: awake, alert , oriented and patient cooperative  Airway and Oxygen Therapy: Patient Spontanous Breathing and Patient connected to nasal cannula oxygen  Post-op Pain: mild  Post-op Assessment: Post-op Vital signs reviewed, Patient's Cardiovascular Status Stable, Respiratory Function Stable, Patent Airway, No signs of Nausea or vomiting and Pain level controlled  Post-op Vital Signs: stable  Complications: No apparent anesthesia complications

## 2012-06-29 NOTE — H&P (Signed)
Elvera Lennox    Chief Complaint: OA left shoulder HPI: The patient is a 76 y.o. female with end stage left shoulder arthritis  Past Medical History  Diagnosis Date  . Diabetes mellitus   . Chronic fatigue   . PONV (postoperative nausea and vomiting)   . Hypertension   . Thyroid disease   . Pneumonia     hx of  . Bronchitis     hx of  . Sleep apnea     uses cpap sparingly  . Urinary incontinence   . Irritable bowel syndrome (IBS)     hx of  . GERD (gastroesophageal reflux disease)   . Migraine     hx of  . Fibromyalgia   . Anemia     hx of    Past Surgical History  Procedure Date  . Appendectomy   . Abdominal hysterectomy   . Cholecystectomy   . Tonsillectomy   . Eye surgery     right cataract removed, laser surgery for glaucoma  . Dilation and curettage of uterus     No family history on file.  Social History:  reports that she has quit smoking. She does not have any smokeless tobacco history on file. She reports that she does not drink alcohol or use illicit drugs.  Allergies:  Allergies  Allergen Reactions  . Glucophage (Metformin Hydrochloride) Diarrhea  . Macrodantin (Nitrofurantoin Macrocrystal) Other (See Comments)    Whelps    . Prednisone Nausea And Vomiting  . Sulfa Antibiotics Other (See Comments)    Whelps    . Clindamycin Hcl Rash    All over torso     Medications Prior to Admission  Medication Sig Dispense Refill  . aspirin 81 MG tablet Take 81 mg by mouth daily.        . Cholecalciferol (VITAMIN D3) 5000 UNITS CAPS Take 1 capsule by mouth 2 (two) times daily.      . DULoxetine (CYMBALTA) 60 MG capsule Take 60 mg by mouth 2 (two) times daily.      Marland Kitchen glimepiride (AMARYL) 4 MG tablet Take 4 mg by mouth daily before breakfast.      . levothyroxine (SYNTHROID, LEVOTHROID) 100 MCG tablet Take 100 mcg by mouth daily.      Marland Kitchen loratadine (CLARITIN) 10 MG tablet Take 10 mg by mouth daily.      Marland Kitchen losartan-hydrochlorothiazide (HYZAAR) 100-12.5 MG  per tablet Take 1 tablet by mouth daily.      . metFORMIN (GLUCOPHAGE) 500 MG tablet Take 500 mg by mouth daily.      . metoprolol (LOPRESSOR) 50 MG tablet Take 50 mg by mouth 2 (two) times daily.      . Omega-3 Fatty Acids (OMEGA 3 PO) Take 1 capsule by mouth daily.      Marland Kitchen OVER THE COUNTER MEDICATION Take 1 tablet by mouth 3 (three) times daily. Zyflamend tablets antiinflammatory      . oxyCODONE-acetaminophen (PERCOCET) 5-325 MG per tablet Take 1 tablet by mouth 4 (four) times daily as needed. For pain      . RABEprazole (ACIPHEX) 20 MG tablet Take 20 mg by mouth 2 (two) times daily.       . Saxagliptin-Metformin (KOMBIGLYZE XR) 5-500 MG TB24 Take 1 tablet by mouth daily.         Physical Exam: left shoulder with painful ROM and exam as noted at 6/26 13 office visit  Vitals  Temp:  [98 F (36.7 C)] 98 F (36.7 C) (07/18  6213) Pulse Rate:  [56] 56  (07/18 0611) Resp:  [18] 18  (07/18 0611) BP: (156)/(72) 156/72 mmHg (07/18 0611) SpO2:  [99 %] 99 % (07/18 0611)  Assessment/Plan  Impression: OA left shoulder  Plan of Action: Procedure(s): TOTAL SHOULDER ARTHROPLASTY  Brissa Asante M 06/29/2012, 7:34 AM

## 2012-06-29 NOTE — Op Note (Signed)
[name], [name] NO.:  [id number]  MEDICAL RECORD NO.:  [id number]  LOCATION:  [facility]                         FACILITY:  [facility]  PHYSICIAN:  Vania Rea. Lacosta Hargan, M.D.  DATE OF BIRTH:  August 09, 1936  DATE OF PROCEDURE:  06/29/2012 DATE OF DISCHARGE:                              OPERATIVE REPORT   PREOPERATIVE DIAGNOSIS:  End-stage left shoulder osteoarthrosis.  POSTOPERATIVE DIAGNOSIS:  End-stage left shoulder osteoarthrosis.  PROCEDURE:  Left total shoulder arthroplasty utilizing a press-fit size 12 DePuy global stem, a 48 x 21 eccentric humeral head and a 44 cemented pegged glenoid.  SURGEON:  Vania Rea. Tyaire Odem, MD  ASSISTANT:  Lucita Lora. Shuford, PA-C  ANESTHESIA:  General endotracheal as well as an interscalene block.  BLOOD LOSS:  200 mL.  DRAINS:  None.  HISTORY:  Angelica [name] is a 76 year old female who has had chronic and progressive increasing bilateral shoulder pain related to arthrosis, left symptomatically greater than the right.  Due to ongoing pain and function limitation, she is brought to the operating room at this time for planned left total shoulder arthroplasty.  Preoperatively counseled Angelica [name] on treatment options as well as risks versus benefits thereof.  Possible surgical complications were reviewed including the potential for bleeding, infection, neurovascular injury, persistent pain, loss of motion, anesthetic complication, failure of the implant, possible need for additional surgery.  She understands and accepts and agrees to the planned procedure.  PROCEDURE IN DETAIL:  After undergoing routine preop evaluation, the patient received prophylactic antibiotics and interscalene block was established in the holding area by the Anesthesia Department.  Placed supine on the operating table after induction of a general endotracheal anesthesia.  Placed in beach-chair position and appropriately padded and protected.  The left shoulder girdle  region was then sterilely prepped and draped in standard fashion.  Time-out was called.  An anterior approach to the left shoulder was made through a 15 cm deltopectoral incision.  Skin flaps were elevated.  Electrocautery was used for hemostasis.  The deltopectoral interval was identified.  Cephalic vein retracted laterally at the deltoid, interval was then developed distally and the upper centimeter of pec major was tenotomized to improve exposure.  Conjoined tendon was then mobilized, retracted medially, and adhesions in the subdeltoid bursa divided for improved exposure.  We then identified the bicipital groove, tenotomized biceps tendon for later tenodesis.  We then split horizontally along the rotator interval, and then divided the subscap from the lesser tuberosity leaving 1 cm cuff of tissue for later repair.  The free margin of the subscap was intact with a series of grasping #2 FiberWire sutures.  We then dissected the capsule with soft tissue insertions from the inferior aspect of the humeral neck allowing complete exposure and dislocation of the humeral head to the wound, retracting the subscap medially and dividing the capsule back past the 6 o'clock position around to 8 o'clock.  Rotator cuff was inspected and found to be intact. Extramedullary guide was then used to outline our proposed humeral head resection and this was then performed with an oscillating saw protecting the rotator cuff.  Sequential hand reaming was then performed to the humeral canal  up to size 12 and then we placed the cookie cutter broach and broached up to size 12 maintaining approximately 30 degrees of retroversion and maintaining the nadir of the degree of retroversion. We then used a rongeur to remove large osteophytes that had developed on the humeral head anteriorly and inferiorly.  At this point, a metal cap was then placed across the cut surface of the proximal humerus and we exposed the glenoid  with a series of Fukuda pitchfork and snake tongue retractors.  Performed a anterior capsular release, mobilized the subscap, and then performed a circumferential excision of the labral tissue including residual proximal stump of the biceps and gained complete circumferential exposure of the glenoid.  This size was the most closely fit a 44.  A central guidepin was placed, we reamed the glenoid fossa to a smooth surface correcting a slight degree of retroversion and then removed residual peripheral bone with a rongeur. We then placed a central drill hole and then using the appropriate guide the peripheral peg holes.  Trial of 44 showed excellent fit.  The glenoid was irrigated.  Cement was mixed in appropriate consistency.  We injected cement into the peripheral peg holes.  We then placed the final size 44 pegged glenoid, impacting with excellent fit fixation.  The cement was allowed harden.  We then returned our attention to the proximal humerus where the canal was irrigated, dried, and then we impacted the size 12 stem using bone graft harvested from the resected humeral head into the metaphyseal region obtaining excellent fit and fixation.  We then performed trial reductions and the 48 x 21 eccentric head showed the best soft tissue balance.  The final +48 x 21 eccentric head was then impacted after the [facility] taper was meticulously cleaned and dried.  Final reduction was performed.  The humeral head showed 50% translation across the glenoid and good soft tissue balance.  At this point, confirmed the subscap had been properly mobilized and repaired it back to the lesser tuberosity.  The soft tissue repair of the cuff of tissue with #2 FiberWires with excellent reapposition and stability and rotator interval was then closed lateral 3 cm with interrupted figure-of- eight FiberWire sutures.  The biceps tendon was then tenodesed in the bicipital groove to adjacent soft tissues with #2  FiberWire.  Shoulder showed excellent motion and stability at this point.  The wound was irrigated.  Hemostasis was obtained.  The deltopectoral interval was then reapproximated with 0-Vicryl sutures, 2-0 Vicryl was used for the subcu layer and intracuticular 3-0 Monocryl for the skin followed by Steri-Strips.  Dry dressing was applied.  Left arm was placed in a sling.  The patient was awakened, extubated, and taken to the recovery room in a stable condition.     Vania Rea. Sherwin Hollingshed, M.D.     KMS/MEDQ  D:  06/29/2012  T:  06/29/2012  Job:  [id number]

## 2012-06-29 NOTE — Progress Notes (Signed)
Report to Kay RN

## 2012-06-29 NOTE — Anesthesia Procedure Notes (Addendum)
Anesthesia Regional Block:  Interscalene brachial plexus block  Pre-Anesthetic Checklist: ,, timeout performed, Correct Patient, Correct Site, Correct Laterality, Correct Procedure, Correct Position, site marked, Risks and benefits discussed,  Surgical consent,  Pre-op evaluation,  At surgeon's request and post-op pain management  Laterality: Left  Prep: chloraprep       Needles:  Injection technique: Single-shot  Needle Type: Echogenic Stimulator Needle     Needle Length: 5cm 5 cm Needle Gauge: 22 and 22 G    Additional Needles:  Procedures: ultrasound guided and nerve stimulator Interscalene brachial plexus block  Nerve Stimulator or Paresthesia:  Response: biceps flexion, 0.45 mA,   Additional Responses:   Narrative:  Start time: 06/29/2012 7:15 AM End time: 06/29/2012 7:33 AM Injection made incrementally with aspirations every 5 mL.  Performed by: Personally  Anesthesiologist: Dr Chaney Malling  Additional Notes: Functioning IV was confirmed and monitors were applied.  A 50mm 22ga Arrow echogenic stimulator needle was used. Sterile prep and drape,hand hygiene and sterile gloves were used.  Negative aspiration and negative test dose prior to incremental administration of local anesthetic. The patient tolerated the procedure well.  Ultrasound guidance: relevent anatomy identified, needle position confirmed, local anesthetic spread visualized around nerve(s), vascular puncture avoided.  Image printed for medical record.   Interscalene brachial plexus block Procedure Name: Intubation Date/Time: 06/29/2012 7:53 AM Performed by: Jefm Miles E Pre-anesthesia Checklist: Patient identified, Timeout performed, Emergency Drugs available, Suction available and Patient being monitored Patient Re-evaluated:Patient Re-evaluated prior to inductionOxygen Delivery Method: Circle system utilized Preoxygenation: Pre-oxygenation with 100% oxygen Intubation Type: IV induction Laryngoscope  Size: Mac and 3 Grade View: Grade I Tube type: Oral Tube size: 7.0 mm Number of attempts: 1 Airway Equipment and Method: Stylet Placement Confirmation: ETT inserted through vocal cords under direct vision,  breath sounds checked- equal and bilateral and positive ETCO2 Secured at: 20 cm Tube secured with: Tape Dental Injury: Teeth and Oropharynx as per pre-operative assessment     Anesthesia Regional Block:   Narrative:

## 2012-06-29 NOTE — Op Note (Signed)
06/29/2012  9:35 AM  PATIENT:   Angelica [name]  76 y.o. female  PRE-OPERATIVE DIAGNOSIS:  OA left shoulder  POST-OPERATIVE DIAGNOSIS:  same  PROCEDURE:  L TSA #12 stem, 48X21 eccentric head, 44 glenoid  SURGEON:  Lace Chenevert, Vania Rea M.D.  ASSISTANTS: Shuford pac   ANESTHESIA:   GET + ISB  EBL: 200  SPECIMEN:  none  Drains: none   PATIENT DISPOSITION:  PACU - hemodynamically stable.    PLAN OF CARE: Admit to inpatient   Dictation# [id number]

## 2012-06-30 ENCOUNTER — Encounter (HOSPITAL_COMMUNITY): Payer: Self-pay | Admitting: Orthopedic Surgery

## 2012-06-30 DIAGNOSIS — M259 Joint disorder, unspecified: Secondary | ICD-10-CM | POA: Insufficient documentation

## 2012-06-30 DIAGNOSIS — M19019 Primary osteoarthritis, unspecified shoulder: Secondary | ICD-10-CM

## 2012-06-30 LAB — GLUCOSE, CAPILLARY
Glucose-Capillary: 120 mg/dL — ABNORMAL HIGH (ref 70–99)
Glucose-Capillary: 257 mg/dL — ABNORMAL HIGH (ref 70–99)

## 2012-06-30 MED ORDER — OXYCODONE-ACETAMINOPHEN 5-325 MG PO TABS: 1.0000 | ORAL_TABLET | ORAL | Status: AC | PRN

## 2012-06-30 MED ORDER — DIAZEPAM 5 MG PO TABS: 5.0000 mg | ORAL_TABLET | Freq: Four times a day (QID) | ORAL | Status: AC | PRN

## 2012-06-30 NOTE — Progress Notes (Signed)
Occupational Therapy Treatment Patient Details Name: RAJAH LAMBA MRN: [id number] DOB: [birth year] Today's Date: 06/30/2012 Time: 5784-6962 OT Time Calculation (min): 52 min  OT Assessment / Plan / Recommendation Comments on Treatment Session Education continued    Follow Up Recommendations   (HHPT and 24hr supervision)    Barriers to Discharge       Equipment Recommendations  None recommended by OT    Recommendations for Other Services    Frequency Min 2X/week   Plan      Precautions / Restrictions Precautions Precautions: Shoulder Type of Shoulder Precautions: left.  Required Braces or Orthoses:  (left UE sling) Restrictions Weight Bearing Restrictions: Yes LUE Weight Bearing: Non weight bearing   Pertinent Vitals/Pain Pt reports 5/10 lt shoulder pain; pre-medicated    ADL  Grooming: Performed;Minimal assistance Where Assessed - Grooming: Unsupported standing Upper Body Dressing: Simulated;Minimal assistance Where Assessed - Upper Body Dressing: Unsupported sitting Toilet Transfer: Simulated;Minimal assistance Toilet Transfer Method: Sit to stand Equipment Used: Gait belt Transfers/Ambulation Related to ADLs: Min HHA throughout room ADL Comments: Educated pt on UB ADL adaptations; pendulum exercises (pt needs to get more comfortable bending forward at hips); pain and anxiety management; sling don and doffing; and sleeping position    OT Diagnosis: Generalized weakness;Acute pain  OT Problem List: Decreased range of motion;Decreased activity tolerance;Impaired balance (sitting and/or standing);Decreased knowledge of use of DME or AE;Pain;Impaired UE functional use;Increased edema OT Treatment Interventions: Self-care/ADL training;Therapeutic exercise;DME and/or AE instruction;Therapeutic activities;Patient/family education;Balance training   OT Goals Acute Rehab OT Goals OT Goal Formulation: With patient Potential to Achieve Goals: Good ADL Goals Pt Will Transfer  to Toilet: with supervision;Ambulation ADL Goal: Toilet Transfer - Progress: Progressing toward goals Additional ADL Goal #1: Pt will be I with UB ADL adaptations. ADL Goal: Additional Goal #1 - Progress: Progressing toward goals Additional ADL Goal #2: Pt will be I with LUE pendulum exercises ADL Goal: Additional Goal #2 - Progress: Progressing toward goals Arm Goals Pt Will Perform AROM: Independently;10 reps;1 set;Left upper extremity (elbow and distally) Arm Goal: AROM - Progress: Progressing toward goal Additional Arm Goal #1: Pt will I'ly instruct caregiver in LUE sling management Arm Goal: Additional Goal #1 - Progress: Progressing toward goals  Visit Information  Last OT Received On: 06/30/12 Assistance Needed: +1    Subjective Data  Subjective: Is my incision as long as this bandage? Patient Stated Goal: Return to playing golf and making jelly   Prior Functioning  Home Living Lives With: Alone Available Help at Discharge: Family;Available 24 hours/day Type of Home: House Home Access: Stairs to enter Entrance Stairs-Rails: None Home Layout: One level Bathroom Shower/Tub: Health visitor: Standard Home Adaptive Equipment: Shower chair with back Prior Function Level of Independence: Independent Able to Take Stairs?: Reciprically Driving: Yes Vocation: Retired Musician: No difficulties Dominant Hand: Right    Cognition  Overall Cognitive Status: Appears within functional limits for tasks assessed/performed    Mobility Bed Mobility Bed Mobility: Supine to Sit Supine to Sit: 6: Modified independent (Device/Increase time);With rails;HOB elevated Transfers Transfers: Sit to Stand;Stand to Sit Sit to Stand: 4: Min assist;From bed;With upper extremity assist Stand to Sit: 5: Supervision;With armrests;To chair/3-in-1 Details for Transfer Assistance: good hand placement           End of Session OT - End of Session Equipment Utilized  During Treatment: Gait belt Activity Tolerance: Patient tolerated treatment well Patient left: in chair;with call bell/phone within reach;with family/visitor present Nurse Communication: Mobility status  GO     Griffith Santilli 06/30/2012, 1:38 PM

## 2012-06-30 NOTE — Discharge Summary (Signed)
  PATIENT ID:      Angelica Wood  MRN:     [id number] DOB/AGE:    1936-08-13 / 76 y.o.     DISCHARGE SUMMARY  ADMISSION DATE:    06/29/2012 DISCHARGE DATE:   06/30/2012   ADMISSION DIAGNOSIS: OA left shoulder  (OA left shoulder)  DISCHARGE DIAGNOSIS:  OA left shoulder    ADDITIONAL DIAGNOSIS: Active Problems:  Shoulder arthritis  Past Medical History  Diagnosis Date  . Diabetes mellitus   . Chronic fatigue   . PONV (postoperative nausea and vomiting)   . Hypertension   . Thyroid disease   . Pneumonia     hx of  . Bronchitis     hx of  . Sleep apnea     uses cpap sparingly  . Urinary incontinence   . Irritable bowel syndrome (IBS)     hx of  . GERD (gastroesophageal reflux disease)   . Migraine     hx of  . Fibromyalgia   . Anemia     hx of    PROCEDURE: Procedure(s): TOTAL SHOULDER ARTHROPLASTY on 06/29/2012  CONSULTS:  none   HISTORY:  See H&P in chart  HOSPITAL COURSE:  [name] is a 76 y.o. admitted on 06/29/2012 and found to have a diagnosis of OA left shoulder.  After appropriate laboratory studies were obtained  they were taken to the operating room on 06/29/2012 and underwent Procedure(s): TOTAL SHOULDER ARTHROPLASTY.   They were given perioperative antibiotics:  Anti-infectives     Start     Dose/Rate Route Frequency Ordered Stop   06/29/12 1600   ceFAZolin (ANCEF) IVPB 1 g/50 mL premix        1 g 100 mL/hr over 30 Minutes Intravenous Every 6 hours 06/29/12 1459 06/30/12 0500   06/28/12 1449   ceFAZolin (ANCEF) 3 g in dextrose 5 % 50 mL IVPB        3 g 160 mL/hr over 30 Minutes Intravenous 60 min pre-op 06/28/12 1450 06/29/12 0800        . Blood products given:none  Patient did well post operatively with mild anxiety overnight that responded to xanax. She tolerated Ot the following am and was felt to be stable for DC home The remainder of the hospital course was dedicated to ambulation and strengthening.   The patient was discharged on 1 Day  Post-Op in  Good condition.

## 2012-06-30 NOTE — Progress Notes (Signed)
UR COMPLETED  

## 2012-06-30 NOTE — Evaluation (Signed)
Occupational Therapy Evaluation Patient Details Name: Angelica Wood MRN: [id number] DOB: 1936/11/19 Today's Date: 06/30/2012 Time: 7829-5621 OT Time Calculation (min): 73 min  OT Assessment / Plan / Recommendation Clinical Impression  Pt s/p Lt TSA and presents with pain, decreased lt shoulder ROM as well as decreased level of I with ADL and LUE management. Pt will benefit from skilled OT in the acute setting to maximize I with ADL, ADL mobility and LUE management prior to d/c    OT Assessment  Patient needs continued OT Services    Follow Up Recommendations  Home health OT;Supervision/Assistance - 24 hour (will need HHPT as HHOT unable to enter home until after PT)    Barriers to Discharge      Equipment Recommendations  None recommended by OT    Recommendations for Other Services    Frequency  Min 2X/week    Precautions / Restrictions Precautions Precautions: Shoulder Type of Shoulder Precautions: left.  Required Braces or Orthoses:  (left UE sling) Restrictions Weight Bearing Restrictions: Yes LUE Weight Bearing: Non weight bearing   Pertinent Vitals/Pain Pt reports 3-5/10 LUE pain; RN made aware    ADL  Toilet Transfer: Simulated;Minimal assistance Toilet Transfer Method: Sit to stand Equipment Used: Gait belt Transfers/Ambulation Related to ADLs: pt Min HHA with ambulation throughout room. Pt refuses to use cane at home therefore, educated family member in room, Angelica Wood, pt will need HHA at home until she becomes more steady ADL Comments: Educated pt on A/ROM elbow and distally- pt required active assist secondary to nerve block not having worn completely off. Also, educated pt and family member on P/ROM 30/60/90- family member able to demonstrate I'ly and states she will educate other family members at home. Will complete other education next session    OT Diagnosis: Generalized weakness;Acute pain  OT Problem List: Decreased range of motion;Decreased activity  tolerance;Impaired balance (sitting and/or standing);Decreased knowledge of use of DME or AE;Pain;Impaired UE functional use;Increased edema OT Treatment Interventions: Self-care/ADL training;Therapeutic exercise;DME and/or AE instruction;Therapeutic activities;Patient/family education;Balance training   OT Goals Acute Rehab OT Goals OT Goal Formulation: With patient Potential to Achieve Goals: Good ADL Goals Pt Will Transfer to Toilet: with supervision;Ambulation ADL Goal: Toilet Transfer - Progress: Goal set today Additional ADL Goal #1: Pt will be I with UB ADL adaptations. ADL Goal: Additional Goal #1 - Progress: Goal set today Additional ADL Goal #2: Pt will be I with LUE pendulum exercises ADL Goal: Additional Goal #2 - Progress: Goal set today Arm Goals Pt Will Perform AROM: Independently;10 reps;1 set;Left upper extremity (elbow and distally) Arm Goal: AROM - Progress: Goal set today Additional Arm Goal #1: Pt will I'ly instruct caregiver in LUE sling management Arm Goal: Additional Goal #1 - Progress: Goal set today  Visit Information  Last OT Received On: 06/30/12 Assistance Needed: +1    Subjective Data  Subjective: Is my incision as long as this bandage? Patient Stated Goal: Return to playing golf and making jelly   Prior Functioning  Vision/Perception  Home Living Lives With: Alone Available Help at Discharge: Family;Available 24 hours/day Type of Home: House Home Access: Stairs to enter Entrance Stairs-Rails: None Home Layout: One level Bathroom Shower/Tub: Health visitor: Standard Home Adaptive Equipment: Shower chair with back Prior Function Level of Independence: Independent Able to Take Stairs?: Reciprically Driving: Yes Vocation: Retired Musician: No difficulties Dominant Hand: Right      Cognition  Overall Cognitive Status: Appears within functional limits for tasks assessed/performed  Extremity/Trunk  Assessment Right Upper Extremity Assessment RUE ROM/Strength/Tone: Deficits;Due to pain RUE ROM/Strength/Tone Deficits: less than 90deg forward flexion; grossly 4/5 strength RUE Sensation: WFL - Light Touch RUE Coordination: WFL - gross/fine motor Left Upper Extremity Assessment LUE ROM/Strength/Tone: Unable to fully assess;Due to pain;Due to precautions LUE Sensation: Deficits (secondary to nerve block)   Mobility Bed Mobility Bed Mobility: Supine to Sit Supine to Sit: 6: Modified independent (Device/Increase time);With rails;HOB elevated Transfers Transfers: Sit to Stand;Stand to Sit Sit to Stand: 4: Min assist;From bed;With upper extremity assist Stand to Sit: 5: Supervision;With armrests;To chair/3-in-1 Details for Transfer Assistance: good hand placement   Exercise    Balance    End of Session OT - End of Session Equipment Utilized During Treatment: Gait belt Activity Tolerance: Patient tolerated treatment well Patient left: in chair;with call bell/phone within reach;with family/visitor present Nurse Communication: Mobility status  GO     Angelica Wood 06/30/2012, 11:31 AM

## 2012-08-23 DIAGNOSIS — J309 Allergic rhinitis, unspecified: Secondary | ICD-10-CM | POA: Insufficient documentation

## 2012-08-23 DIAGNOSIS — G4733 Obstructive sleep apnea (adult) (pediatric): Secondary | ICD-10-CM | POA: Insufficient documentation

## 2012-08-23 DIAGNOSIS — L820 Inflamed seborrheic keratosis: Secondary | ICD-10-CM | POA: Insufficient documentation

## 2012-08-23 DIAGNOSIS — E1142 Type 2 diabetes mellitus with diabetic polyneuropathy: Secondary | ICD-10-CM | POA: Insufficient documentation

## 2012-08-23 DIAGNOSIS — R911 Solitary pulmonary nodule: Secondary | ICD-10-CM | POA: Insufficient documentation

## 2012-08-23 DIAGNOSIS — E1149 Type 2 diabetes mellitus with other diabetic neurological complication: Secondary | ICD-10-CM | POA: Insufficient documentation

## 2012-08-23 DIAGNOSIS — F325 Major depressive disorder, single episode, in full remission: Secondary | ICD-10-CM | POA: Insufficient documentation

## 2012-08-23 DIAGNOSIS — IMO0001 Reserved for inherently not codable concepts without codable children: Secondary | ICD-10-CM | POA: Insufficient documentation

## 2012-12-15 ENCOUNTER — Encounter (HOSPITAL_COMMUNITY): Payer: Self-pay

## 2012-12-15 ENCOUNTER — Encounter (HOSPITAL_COMMUNITY)
Admission: RE | Admit: 2012-12-15 | Discharge: 2012-12-15 | Disposition: A | Discharge status: Home / Self Care | Payer: Medicare Other | Source: Ambulatory Visit | Attending: Orthopedic Surgery | Admitting: Orthopedic Surgery

## 2012-12-15 DIAGNOSIS — B958 Unspecified staphylococcus as the cause of diseases classified elsewhere: Secondary | ICD-10-CM | POA: Insufficient documentation

## 2012-12-15 DIAGNOSIS — M19019 Primary osteoarthritis, unspecified shoulder: Secondary | ICD-10-CM | POA: Insufficient documentation

## 2012-12-15 DIAGNOSIS — Z01818 Encounter for other preprocedural examination: Secondary | ICD-10-CM | POA: Insufficient documentation

## 2012-12-15 HISTORY — DX: Unspecified osteoarthritis, unspecified site: M19.90

## 2012-12-15 HISTORY — DX: Urgency of urination: R39.15

## 2012-12-15 HISTORY — DX: Shortness of breath: R06.02

## 2012-12-15 HISTORY — DX: Dizziness and giddiness: R42

## 2012-12-15 HISTORY — DX: Hypothyroidism, unspecified: E03.9

## 2012-12-15 LAB — TYPE AND SCREEN
ABO/RH(D): A POS
Antibody Screen: NEGATIVE

## 2012-12-15 LAB — CBC
Hemoglobin: 10 g/dL — ABNORMAL LOW (ref 12.0–15.0)
MCH: 24.7 pg — ABNORMAL LOW (ref 26.0–34.0)
MCV: 76.8 fL — ABNORMAL LOW (ref 78.0–100.0)
Platelets: 270 10*3/uL (ref 150–400)
RBC: 4.05 MIL/uL (ref 3.87–5.11)

## 2012-12-15 LAB — BASIC METABOLIC PANEL
CO2: 25 mEq/L (ref 19–32)
Calcium: 8.3 mg/dL — ABNORMAL LOW (ref 8.4–10.5)
Glucose, Bld: 316 mg/dL — ABNORMAL HIGH (ref 70–99)
Sodium: 133 mEq/L — ABNORMAL LOW (ref 135–145)

## 2012-12-15 NOTE — Progress Notes (Signed)
PCP is Dr. Donzetta Sprung in [place], [place] and patient informed Nurse that she had a stress test a few years ago at [facility], and had a chest xray on yesterday at physicians office. Records requested. Office # [phone] and fax # [phone]. Patient informed Nurse that she has sleep apnea and is supposed to wear a CPAP machine but has not worn it since her husband passed.

## 2012-12-15 NOTE — Pre-Procedure Instructions (Addendum)
20 Angelica Wood  12/15/2012   Your procedure is scheduled on:  Thurs, Jan 9 @ 10:30 AM  Report to [facility] at 8:30 AM.  Call this number if you have problems the morning of surgery: [phone]   Remember:   Do not eat food or drink:After Midnight.      Take these medicines the morning of surgery with A SIP OF WATER: Duloxetine(Cymbalta),Synthroid(Levothyroxine),Metoprolol(Lopressor),Rabeprazole(Aciphex),and Pain Pill(if needed)  Do not take any diabetic medications the morning of your surgery               Discontinue Fish Oil and Aspirin,No Aleve,Goody's,Ibuprofen,BC's,or any herbal medications   Do not wear jewelry, make-up or nail polish.  Do not wear lotions, powders, or perfumes. You may wear deodorant.  Do not shave 48 hours prior to surgery.   Do not bring valuables to the hospital.  Contacts, dentures or bridgework may not be worn into surgery.  Leave suitcase in the car. After surgery it may be brought to your room.  For patients admitted to the hospital, checkout time is 11:00 AM the day of discharge.   Patients discharged the day of surgery will not be allowed to drive home.    Special Instructions: Shower using CHG 2 nights before surgery and the night before surgery.  If you shower the day of surgery use CHG.  Use special wash - you have one bottle of CHG for all showers.  You should use approximately 1/3 of the bottle for each shower.   Please read over the following fact sheets that you were given: Pain Booklet, Coughing and Deep Breathing, Blood Transfusion Information, MRSA Information and Surgical Site Infection Prevention

## 2012-12-18 NOTE — Consult Note (Signed)
Anesthesia Chart Review: Patient is a 77 year old female scheduled for a right total shoulder on 02/08/13 by Dr. Rennis Chris.  (This was moved from 12/21/12 for unknown reasons, but PCP notes from 12/14/12 indicate that she was started on Levaquin for cough and acute sinusitis.)  She is s/p left total shoulder on 06/29/12.  History includes OSA without CPAP use, post-operative N/V, former smoker, DM2, HTN, IBS, migraines, anemia, chronic fatigue, GERD, fibromyalgia, urinary incontinence, PNA, hypothyroidism.  PCP is Dr. Donzetta Sprung in [place].   Labs noted. Non-fasting glucose is 316. H/H 10.0/31.1. PT/PTT WNL. She will get a CBG on arrival.  I notified Toniann Fail at Dr. Dub Mikes office of elevated glucose.  (On 12/14/12, Dr. Reuel Boom actually increased her Lantus insulin to 14 Units with instructions to increase it by 2 Units every 3 days until her fasting glucose was between 80-150.)   There were no acute findings on CXR from 06/22/12.  She apparently had another CXR at [facility] on 12/14/12.  The report is still pending, but Dr. Rosann Auerbach note mentions "NEGATIVE" by the CXR order.  (I requested that report be faxed to PAT when available.)  EKG on 06/22/12 showed NSR.  Nuclear stress test on 02/01/11 [facility]) was normal, no scar or ischemia, normal wall motion, EF 70%.  Echo on 02/01/11 [facility]) showed normal LV chamber size, normal global LV wall motion and contractility, mild LVH, possible mild diastolic dysfunction, EF 65%.  Normal RV size and function.  Toniann Fail at Dr. Dub Mikes office left me a message stating Dr. Rennis Chris would like labs per anesthesia prior to surgery date.  I'll give the chart to our PAT scheduler so she can get patient a lab only appointment within 14 days of patient's surgery (since her previous PAT labs from 12/15/12 will be out of date since her surgery was rescheduled).  Shonna Chock, [facility] 12/19/12 1657

## 2013-02-01 ENCOUNTER — Encounter (HOSPITAL_COMMUNITY)
Admission: RE | Admit: 2013-02-01 | Discharge: 2013-02-01 | Disposition: A | Discharge status: Home / Self Care | Payer: Medicare Other | Source: Ambulatory Visit | Attending: Orthopedic Surgery | Admitting: Orthopedic Surgery

## 2013-02-01 LAB — BASIC METABOLIC PANEL
CO2: 26 mEq/L (ref 19–32)
Calcium: 9.2 mg/dL (ref 8.4–10.5)
GFR calc non Af Amer: 63 mL/min — ABNORMAL LOW (ref >90)
Potassium: 4.5 mEq/L (ref 3.5–5.1)
Sodium: 134 mEq/L — ABNORMAL LOW (ref 135–145)

## 2013-02-01 LAB — CBC
MCH: 25.1 pg — ABNORMAL LOW (ref 26.0–34.0)
MCHC: 32.3 g/dL (ref 30.0–36.0)
Platelets: 293 10*3/uL (ref 150–400)
RBC: 4.43 MIL/uL (ref 3.87–5.11)

## 2013-02-01 LAB — SURGICAL PCR SCREEN
MRSA, PCR: POSITIVE — AB
Staphylococcus aureus: POSITIVE — AB

## 2013-02-01 LAB — TYPE AND SCREEN
ABO/RH(D): A POS
Antibody Screen: NEGATIVE

## 2013-02-01 NOTE — Pre-Procedure Instructions (Signed)
Angelica Wood  02/01/2013   Your procedure is scheduled on: Thursday  02/08/13   Report to [facility] at 530 AM.  Call this number if you have problems the morning of surgery: [phone]   Remember:   Do not eat food or drink liquids after midnight.   Take these medicines the morning of surgery with A SIP OF WATER: CYMBALTA, SYNTHROID, METOPROLOL(LOPRESSOR),PERCOCET PAIN PILL, ACIPHEX   Do not wear jewelry, make-up or nail polish.  Do not wear lotions, powders, or perfumes. You may wear deodorant.  Do not shave 48 hours prior to surgery. Men may shave face and neck.  Do not bring valuables to the hospital.  Contacts, dentures or bridgework may not be worn into surgery.  Leave suitcase in the car. After surgery it may be brought to your room.  For patients admitted to the hospital, checkout time is 11:00 AM the day of  discharge.   Patients discharged the day of surgery will not be allowed to drive  home.  Name and phone number of your driver:  Special Instructions: Shower using CHG 2 nights before surgery and the night before surgery.  If you shower the day of surgery use CHG.  Use special wash - you have one bottle of CHG for all showers.  You should use approximately 1/3 of the bottle for each shower.   Please read over the following fact sheets that you were given: Pain Booklet, Coughing and Deep Breathing, Blood Transfusion Information, Total Joint Packet and MRSA Information

## 2013-02-07 MED ORDER — LACTATED RINGERS IV SOLN: INTRAVENOUS | Status: DC

## 2013-02-07 MED ORDER — DEXTROSE 5 % IV SOLN
3.0000 g | INTRAVENOUS | Status: DC
  Filled 2013-02-07 (×2): qty 3000

## 2013-02-07 MED ORDER — CHLORHEXIDINE GLUCONATE 4 % EX LIQD: 60.0000 mL | Freq: Once | CUTANEOUS | Status: DC

## 2013-02-08 ENCOUNTER — Encounter (HOSPITAL_COMMUNITY): Payer: Self-pay | Admitting: *Deleted

## 2013-02-08 ENCOUNTER — Inpatient Hospital Stay (HOSPITAL_COMMUNITY): Payer: Medicare Other | Admitting: Vascular Surgery

## 2013-02-08 ENCOUNTER — Encounter (HOSPITAL_COMMUNITY): Payer: Self-pay | Admitting: Vascular Surgery

## 2013-02-08 ENCOUNTER — Inpatient Hospital Stay (HOSPITAL_COMMUNITY)
Admission: RE | Admit: 2013-02-08 | Discharge: 2013-02-09 | DRG: 484 | Disposition: A | Discharge status: Home Health | Payer: Medicare Other | Source: Ambulatory Visit | Attending: Orthopedic Surgery | Admitting: Orthopedic Surgery

## 2013-02-08 ENCOUNTER — Encounter (HOSPITAL_COMMUNITY)
Admission: RE | Disposition: A | Discharge status: Home Health | Payer: Self-pay | Source: Ambulatory Visit | Attending: Orthopedic Surgery

## 2013-02-08 DIAGNOSIS — Z888 Allergy status to other drugs, medicaments and biological substances status: Secondary | ICD-10-CM

## 2013-02-08 DIAGNOSIS — IMO0001 Reserved for inherently not codable concepts without codable children: Secondary | ICD-10-CM | POA: Diagnosis present

## 2013-02-08 DIAGNOSIS — M19019 Primary osteoarthritis, unspecified shoulder: Principal | ICD-10-CM | POA: Diagnosis present

## 2013-02-08 DIAGNOSIS — E039 Hypothyroidism, unspecified: Secondary | ICD-10-CM | POA: Diagnosis present

## 2013-02-08 DIAGNOSIS — E119 Type 2 diabetes mellitus without complications: Secondary | ICD-10-CM | POA: Diagnosis present

## 2013-02-08 DIAGNOSIS — Z96619 Presence of unspecified artificial shoulder joint: Secondary | ICD-10-CM

## 2013-02-08 DIAGNOSIS — Z882 Allergy status to sulfonamides status: Secondary | ICD-10-CM

## 2013-02-08 DIAGNOSIS — K219 Gastro-esophageal reflux disease without esophagitis: Secondary | ICD-10-CM | POA: Diagnosis present

## 2013-02-08 DIAGNOSIS — I10 Essential (primary) hypertension: Secondary | ICD-10-CM | POA: Diagnosis present

## 2013-02-08 DIAGNOSIS — Z794 Long term (current) use of insulin: Secondary | ICD-10-CM

## 2013-02-08 DIAGNOSIS — G4730 Sleep apnea, unspecified: Secondary | ICD-10-CM | POA: Diagnosis present

## 2013-02-08 DIAGNOSIS — Z01812 Encounter for preprocedural laboratory examination: Secondary | ICD-10-CM

## 2013-02-08 DIAGNOSIS — Z87891 Personal history of nicotine dependence: Secondary | ICD-10-CM

## 2013-02-08 DIAGNOSIS — Z79899 Other long term (current) drug therapy: Secondary | ICD-10-CM

## 2013-02-08 HISTORY — PX: TOTAL SHOULDER ARTHROPLASTY: SHX126

## 2013-02-08 LAB — GLUCOSE, CAPILLARY
Glucose-Capillary: 351 mg/dL — ABNORMAL HIGH (ref 70–99)
Glucose-Capillary: 224 mg/dL — ABNORMAL HIGH (ref 70–99)

## 2013-02-08 SURGERY — ARTHROPLASTY, SHOULDER, TOTAL
Anesthesia: General | Site: Shoulder | Laterality: Right | Wound class: Clean

## 2013-02-08 MED ORDER — ARTIFICIAL TEARS OP OINT
TOPICAL_OINTMENT | OPHTHALMIC | Status: DC | PRN
  Administered 2013-02-08: 1 via OPHTHALMIC

## 2013-02-08 MED ORDER — KETOROLAC TROMETHAMINE 15 MG/ML IJ SOLN
15.0000 mg | Freq: Four times a day (QID) | INTRAMUSCULAR | Status: DC
  Administered 2013-02-08 – 2013-02-09 (×5): 15 mg via INTRAVENOUS
  Filled 2013-02-08 (×8): qty 1

## 2013-02-08 MED ORDER — VANCOMYCIN HCL IN DEXTROSE 1-5 GM/200ML-% IV SOLN
INTRAVENOUS | Status: AC
  Administered 2013-02-08: 1000 mg via INTRAVENOUS
  Filled 2013-02-08: qty 200

## 2013-02-08 MED ORDER — ONDANSETRON HCL 4 MG/2ML IJ SOLN: 4.0000 mg | Freq: Four times a day (QID) | INTRAMUSCULAR | Status: DC | PRN

## 2013-02-08 MED ORDER — MENTHOL 3 MG MT LOZG: 1.0000 | LOZENGE | OROMUCOSAL | Status: DC | PRN

## 2013-02-08 MED ORDER — METOCLOPRAMIDE HCL 5 MG/ML IJ SOLN: 10.0000 mg | Freq: Once | INTRAMUSCULAR | Status: DC | PRN

## 2013-02-08 MED ORDER — OXYCODONE HCL 5 MG PO TABS: 5.0000 mg | ORAL_TABLET | Freq: Once | ORAL | Status: DC | PRN

## 2013-02-08 MED ORDER — ACETAMINOPHEN 325 MG PO TABS
650.0000 mg | ORAL_TABLET | Freq: Four times a day (QID) | ORAL | Status: DC | PRN
  Administered 2013-02-08: 650 mg via ORAL
  Filled 2013-02-08: qty 2

## 2013-02-08 MED ORDER — LIDOCAINE HCL (CARDIAC) 20 MG/ML IV SOLN
INTRAVENOUS | Status: DC | PRN
  Administered 2013-02-08: 40 mg via INTRAVENOUS

## 2013-02-08 MED ORDER — CEFAZOLIN SODIUM 1-5 GM-% IV SOLN
1.0000 g | Freq: Four times a day (QID) | INTRAVENOUS | Status: AC
  Administered 2013-02-08 – 2013-02-09 (×3): 1 g via INTRAVENOUS
  Filled 2013-02-08 (×3): qty 50

## 2013-02-08 MED ORDER — BISACODYL 5 MG PO TBEC: 5.0000 mg | DELAYED_RELEASE_TABLET | Freq: Every day | ORAL | Status: DC | PRN

## 2013-02-08 MED ORDER — ONDANSETRON HCL 4 MG/2ML IJ SOLN
INTRAMUSCULAR | Status: DC | PRN
  Administered 2013-02-08: 4 mg via INTRAVENOUS

## 2013-02-08 MED ORDER — ZOLPIDEM TARTRATE 5 MG PO TABS: 5.0000 mg | ORAL_TABLET | Freq: Every evening | ORAL | Status: DC | PRN

## 2013-02-08 MED ORDER — ALPRAZOLAM 0.25 MG PO TABS
0.2500 mg | ORAL_TABLET | Freq: Three times a day (TID) | ORAL | Status: DC | PRN
  Administered 2013-02-08 – 2013-02-09 (×4): 0.25 mg via ORAL
  Filled 2013-02-08 (×4): qty 1

## 2013-02-08 MED ORDER — PHENYLEPHRINE HCL 10 MG/ML IJ SOLN
10.0000 mg | INTRAVENOUS | Status: DC | PRN
  Administered 2013-02-08: 20 ug/min via INTRAVENOUS

## 2013-02-08 MED ORDER — FLEET ENEMA 7-19 GM/118ML RE ENEM: 1.0000 | ENEMA | Freq: Once | RECTAL | Status: AC | PRN

## 2013-02-08 MED ORDER — LIDOCAINE HCL 1 % IJ SOLN
INTRAMUSCULAR | Status: DC | PRN
  Administered 2013-02-08: 2 mL via INTRADERMAL

## 2013-02-08 MED ORDER — INSULIN ASPART 100 UNIT/ML ~~LOC~~ SOLN
0.0000 [IU] | Freq: Three times a day (TID) | SUBCUTANEOUS | Status: DC
Start: 2013-02-08 — End: 2013-02-09
  Administered 2013-02-08: 15 [IU] via SUBCUTANEOUS
  Administered 2013-02-08: 5 [IU] via SUBCUTANEOUS
  Administered 2013-02-09: 3 [IU] via SUBCUTANEOUS
  Administered 2013-02-09: 2 [IU] via SUBCUTANEOUS

## 2013-02-08 MED ORDER — SODIUM CHLORIDE 0.9 % IR SOLN
Status: DC | PRN
  Administered 2013-02-08: 1000 mL

## 2013-02-08 MED ORDER — ROCURONIUM BROMIDE 100 MG/10ML IV SOLN
INTRAVENOUS | Status: DC | PRN
  Administered 2013-02-08: 50 mg via INTRAVENOUS

## 2013-02-08 MED ORDER — PHENOL 1.4 % MT LIQD: 1.0000 | OROMUCOSAL | Status: DC | PRN

## 2013-02-08 MED ORDER — METHOCARBAMOL 500 MG PO TABS
500.0000 mg | ORAL_TABLET | Freq: Four times a day (QID) | ORAL | Status: DC | PRN
  Administered 2013-02-09: 500 mg via ORAL
  Filled 2013-02-08 (×2): qty 1

## 2013-02-08 MED ORDER — MIDAZOLAM HCL 5 MG/5ML IJ SOLN
INTRAMUSCULAR | Status: DC | PRN
  Administered 2013-02-08 (×2): 1 mg via INTRAVENOUS

## 2013-02-08 MED ORDER — CEFAZOLIN SODIUM-DEXTROSE 2-3 GM-% IV SOLR
2.0000 g | Freq: Once | INTRAVENOUS | Status: AC
  Administered 2013-02-08: 2 g via INTRAVENOUS
  Filled 2013-02-08: qty 50

## 2013-02-08 MED ORDER — LEVOTHYROXINE SODIUM 100 MCG PO TABS
100.0000 ug | ORAL_TABLET | Freq: Every day | ORAL | Status: DC
  Administered 2013-02-09: 100 ug via ORAL
  Filled 2013-02-08 (×2): qty 1

## 2013-02-08 MED ORDER — DIPHENHYDRAMINE HCL 12.5 MG/5ML PO ELIX: 12.5000 mg | ORAL_SOLUTION | ORAL | Status: DC | PRN

## 2013-02-08 MED ORDER — ROPIVACAINE HCL 5 MG/ML IJ SOLN
INTRAMUSCULAR | Status: DC | PRN
  Administered 2013-02-08: 30 mL

## 2013-02-08 MED ORDER — HYDROMORPHONE HCL PF 1 MG/ML IJ SOLN: 0.2500 mg | INTRAMUSCULAR | Status: DC | PRN

## 2013-02-08 MED ORDER — CEFAZOLIN SODIUM-DEXTROSE 2-3 GM-% IV SOLR
INTRAVENOUS | Status: AC
  Filled 2013-02-08: qty 50

## 2013-02-08 MED ORDER — EPHEDRINE SULFATE 50 MG/ML IJ SOLN
INTRAMUSCULAR | Status: DC | PRN
  Administered 2013-02-08 (×3): 5 mg via INTRAVENOUS

## 2013-02-08 MED ORDER — PANTOPRAZOLE SODIUM 40 MG PO TBEC
40.0000 mg | DELAYED_RELEASE_TABLET | Freq: Every day | ORAL | Status: DC
  Administered 2013-02-09: 40 mg via ORAL
  Filled 2013-02-08 (×2): qty 1

## 2013-02-08 MED ORDER — DOCUSATE SODIUM 100 MG PO CAPS
100.0000 mg | ORAL_CAPSULE | Freq: Two times a day (BID) | ORAL | Status: DC
  Administered 2013-02-08 – 2013-02-09 (×2): 100 mg via ORAL
  Filled 2013-02-08 (×2): qty 1

## 2013-02-08 MED ORDER — ALUM & MAG HYDROXIDE-SIMETH 200-200-20 MG/5ML PO SUSP: 30.0000 mL | ORAL | Status: DC | PRN

## 2013-02-08 MED ORDER — OXYCODONE HCL 5 MG/5ML PO SOLN: 5.0000 mg | Freq: Once | ORAL | Status: DC | PRN

## 2013-02-08 MED ORDER — ALBUTEROL SULFATE HFA 108 (90 BASE) MCG/ACT IN AERS
2.0000 | INHALATION_SPRAY | Freq: Four times a day (QID) | RESPIRATORY_TRACT | Status: DC | PRN
Start: 2013-02-08 — End: 2013-02-09
  Filled 2013-02-08: qty 6.7

## 2013-02-08 MED ORDER — METOCLOPRAMIDE HCL 10 MG PO TABS: 5.0000 mg | ORAL_TABLET | Freq: Three times a day (TID) | ORAL | Status: DC | PRN

## 2013-02-08 MED ORDER — GLYCOPYRROLATE 0.2 MG/ML IJ SOLN
INTRAMUSCULAR | Status: DC | PRN
  Administered 2013-02-08: 0.6 mg via INTRAVENOUS

## 2013-02-08 MED ORDER — LIDOCAINE HCL 4 % MT SOLN
OROMUCOSAL | Status: DC | PRN
  Administered 2013-02-08: 4 mL via TOPICAL

## 2013-02-08 MED ORDER — PROPOFOL 10 MG/ML IV BOLUS
INTRAVENOUS | Status: DC | PRN
  Administered 2013-02-08: 160 mg via INTRAVENOUS

## 2013-02-08 MED ORDER — LACTATED RINGERS IV SOLN
INTRAVENOUS | Status: DC | PRN
  Administered 2013-02-08 (×2): via INTRAVENOUS

## 2013-02-08 MED ORDER — NEOSTIGMINE METHYLSULFATE 1 MG/ML IJ SOLN
INTRAMUSCULAR | Status: DC | PRN
  Administered 2013-02-08: 4 mg via INTRAVENOUS

## 2013-02-08 MED ORDER — DEXTROSE 5 % IV SOLN
500.0000 mg | Freq: Four times a day (QID) | INTRAVENOUS | Status: DC | PRN
  Filled 2013-02-08: qty 5

## 2013-02-08 MED ORDER — OXYCODONE-ACETAMINOPHEN 5-325 MG PO TABS
1.0000 | ORAL_TABLET | ORAL | Status: DC | PRN
  Administered 2013-02-09 (×3): 2 via ORAL
  Filled 2013-02-08 (×4): qty 2

## 2013-02-08 MED ORDER — LOSARTAN POTASSIUM-HCTZ 100-12.5 MG PO TABS: 1.0000 | ORAL_TABLET | Freq: Every day | ORAL | Status: DC

## 2013-02-08 MED ORDER — HYDROCHLOROTHIAZIDE 12.5 MG PO CAPS
12.5000 mg | ORAL_CAPSULE | Freq: Every day | ORAL | Status: DC
  Administered 2013-02-09: 12.5 mg via ORAL
  Filled 2013-02-08: qty 1

## 2013-02-08 MED ORDER — DEXAMETHASONE SODIUM PHOSPHATE 4 MG/ML IJ SOLN
INTRAMUSCULAR | Status: DC | PRN
  Administered 2013-02-08: 4 mg via INTRAVENOUS

## 2013-02-08 MED ORDER — HYDROMORPHONE HCL PF 1 MG/ML IJ SOLN
0.2500 mg | INTRAMUSCULAR | Status: DC | PRN
  Administered 2013-02-08 – 2013-02-09 (×3): 1 mg via INTRAVENOUS
  Filled 2013-02-08 (×4): qty 1

## 2013-02-08 MED ORDER — METOCLOPRAMIDE HCL 5 MG/ML IJ SOLN: 5.0000 mg | Freq: Three times a day (TID) | INTRAMUSCULAR | Status: DC | PRN

## 2013-02-08 MED ORDER — LORATADINE 10 MG PO TABS
10.0000 mg | ORAL_TABLET | Freq: Every day | ORAL | Status: DC
  Administered 2013-02-09: 10 mg via ORAL
  Filled 2013-02-08 (×2): qty 1

## 2013-02-08 MED ORDER — KETOROLAC TROMETHAMINE 30 MG/ML IJ SOLN
INTRAMUSCULAR | Status: AC
  Administered 2013-02-08: 15 mg
  Filled 2013-02-08: qty 1

## 2013-02-08 MED ORDER — DULOXETINE HCL 60 MG PO CPEP
60.0000 mg | ORAL_CAPSULE | Freq: Two times a day (BID) | ORAL | Status: DC
  Administered 2013-02-09: 60 mg via ORAL
  Filled 2013-02-08 (×4): qty 1

## 2013-02-08 MED ORDER — FENTANYL CITRATE 0.05 MG/ML IJ SOLN
INTRAMUSCULAR | Status: DC | PRN
  Administered 2013-02-08: 50 ug via INTRAVENOUS

## 2013-02-08 MED ORDER — ONDANSETRON HCL 4 MG PO TABS: 4.0000 mg | ORAL_TABLET | Freq: Four times a day (QID) | ORAL | Status: DC | PRN

## 2013-02-08 MED ORDER — ACETAMINOPHEN 650 MG RE SUPP: 650.0000 mg | Freq: Four times a day (QID) | RECTAL | Status: DC | PRN

## 2013-02-08 MED ORDER — TEMAZEPAM 15 MG PO CAPS: 15.0000 mg | ORAL_CAPSULE | Freq: Every evening | ORAL | Status: DC | PRN

## 2013-02-08 MED ORDER — POLYETHYLENE GLYCOL 3350 17 G PO PACK: 17.0000 g | PACK | Freq: Every day | ORAL | Status: DC | PRN

## 2013-02-08 MED ORDER — LACTATED RINGERS IV SOLN
INTRAVENOUS | Status: DC
  Administered 2013-02-08: 75 mL/h via INTRAVENOUS

## 2013-02-08 MED ORDER — METOPROLOL TARTRATE 50 MG PO TABS
50.0000 mg | ORAL_TABLET | Freq: Two times a day (BID) | ORAL | Status: DC
  Administered 2013-02-08 – 2013-02-09 (×2): 50 mg via ORAL
  Filled 2013-02-08 (×3): qty 1

## 2013-02-08 MED ORDER — LOSARTAN POTASSIUM 50 MG PO TABS
100.0000 mg | ORAL_TABLET | Freq: Every day | ORAL | Status: DC
  Administered 2013-02-09: 100 mg via ORAL
  Filled 2013-02-08: qty 2

## 2013-02-08 SURGICAL SUPPLY — 61 items
BLADE SAW SGTL 83.5X18.5 (BLADE) ×2 IMPLANT
CEMENT BONE DEPUY (Cement) ×2 IMPLANT
CLOTH BEACON ORANGE TIMEOUT ST (SAFETY) ×2 IMPLANT
CLSR STERI-STRIP ANTIMIC 1/2X4 (GAUZE/BANDAGES/DRESSINGS) ×2 IMPLANT
COVER SURGICAL LIGHT HANDLE (MISCELLANEOUS) ×2 IMPLANT
DRAPE INCISE IOBAN 66X45 STRL (DRAPES) ×2 IMPLANT
DRAPE SURG 17X11 SM STRL (DRAPES) ×2 IMPLANT
DRAPE SURG 17X23 STRL (DRAPES) ×2 IMPLANT
DRAPE U-SHAPE 47X51 STRL (DRAPES) ×2 IMPLANT
DRILL BIT 7/64X5 (BIT) IMPLANT
DRSG AQUACEL AG ADV 3.5X10 (GAUZE/BANDAGES/DRESSINGS) ×2 IMPLANT
DRSG MEPILEX BORDER 4X8 (GAUZE/BANDAGES/DRESSINGS) ×2 IMPLANT
DURAPREP 26ML APPLICATOR (WOUND CARE) ×4 IMPLANT
ELECT BLADE 4.0 EZ CLEAN MEGAD (MISCELLANEOUS) ×2
ELECT CAUTERY BLADE 6.4 (BLADE) ×2 IMPLANT
ELECT REM PT RETURN 9FT ADLT (ELECTROSURGICAL) ×2
ELECTRODE BLDE 4.0 EZ CLN MEGD (MISCELLANEOUS) ×1 IMPLANT
ELECTRODE REM PT RTRN 9FT ADLT (ELECTROSURGICAL) ×1 IMPLANT
FACESHIELD LNG OPTICON STERILE (SAFETY) ×6 IMPLANT
GLENOID ANCHOR PEG CROSSLK 44 (Orthopedic Implant) ×2 IMPLANT
GLOVE BIO SURGEON STRL SZ7.5 (GLOVE) ×2 IMPLANT
GLOVE BIO SURGEON STRL SZ8 (GLOVE) ×2 IMPLANT
GLOVE EUDERMIC 7 POWDERFREE (GLOVE) ×2 IMPLANT
GLOVE SS BIOGEL STRL SZ 7.5 (GLOVE) ×1 IMPLANT
GLOVE SUPERSENSE BIOGEL SZ 7.5 (GLOVE) ×1
GOWN STRL NON-REIN LRG LVL3 (GOWN DISPOSABLE) ×2 IMPLANT
GOWN STRL REIN XL XLG (GOWN DISPOSABLE) ×4 IMPLANT
HEAD HUM ECCENTRIC 48X21 STRL (Trauma) ×2 IMPLANT
KIT BASIN OR (CUSTOM PROCEDURE TRAY) ×2 IMPLANT
KIT ROOM TURNOVER OR (KITS) ×2 IMPLANT
MANIFOLD NEPTUNE II (INSTRUMENTS) ×2 IMPLANT
NDL SUT 6 .5 CRC .975X.05 MAYO (NEEDLE) ×1 IMPLANT
NEEDLE HYPO 25GX1X1/2 BEV (NEEDLE) IMPLANT
NEEDLE MAYO TAPER (NEEDLE) ×1
NS IRRIG 1000ML POUR BTL (IV SOLUTION) ×2 IMPLANT
PACK SHOULDER (CUSTOM PROCEDURE TRAY) ×2 IMPLANT
PAD ARMBOARD 7.5X6 YLW CONV (MISCELLANEOUS) ×4 IMPLANT
PASSER SUT SWANSON 36MM LOOP (INSTRUMENTS) IMPLANT
PIN METAGLENE 2.5 (PIN) ×2 IMPLANT
SLING ARM FOAM STRAP LRG (SOFTGOODS) IMPLANT
SLING ARM FOAM STRAP MED (SOFTGOODS) ×2 IMPLANT
SLING ARM FOAM STRAP XLG (SOFTGOODS) ×2 IMPLANT
SMARTMIX MINI TOWER (MISCELLANEOUS) ×2
SPONGE LAP 18X18 X RAY DECT (DISPOSABLE) ×2 IMPLANT
SPONGE LAP 4X18 X RAY DECT (DISPOSABLE) ×2 IMPLANT
STEM HUMERAL 12MM (Trauma) ×2 IMPLANT
STRIP CLOSURE SKIN 1/2X4 (GAUZE/BANDAGES/DRESSINGS) ×2 IMPLANT
SUCTION FRAZIER TIP 10 FR DISP (SUCTIONS) ×2 IMPLANT
SUT BONE WAX W31G (SUTURE) IMPLANT
SUT FIBERWIRE #2 38 T-5 BLUE (SUTURE) ×6
SUT MNCRL AB 3-0 PS2 18 (SUTURE) ×2 IMPLANT
SUT VIC AB 1 CT1 27 (SUTURE) ×3
SUT VIC AB 1 CT1 27XBRD ANBCTR (SUTURE) ×3 IMPLANT
SUT VIC AB 2-0 CT1 27 (SUTURE) ×2
SUT VIC AB 2-0 CT1 TAPERPNT 27 (SUTURE) ×2 IMPLANT
SUTURE FIBERWR #2 38 T-5 BLUE (SUTURE) ×3 IMPLANT
SYR CONTROL 10ML LL (SYRINGE) IMPLANT
TOWEL OR 17X24 6PK STRL BLUE (TOWEL DISPOSABLE) ×2 IMPLANT
TOWEL OR 17X26 10 PK STRL BLUE (TOWEL DISPOSABLE) ×2 IMPLANT
TOWER SMARTMIX MINI (MISCELLANEOUS) ×1 IMPLANT
WATER STERILE IRR 1000ML POUR (IV SOLUTION) ×2 IMPLANT

## 2013-02-08 NOTE — Evaluation (Signed)
Occupational Therapy Evaluation Patient Details Name: Angelica Wood MRN: [id number] DOB: [birth year] Today's Date: 02/08/2013 Time: 0865-7846 OT Time Calculation (min): 36 min  OT Assessment / Plan / Recommendation Clinical Impression  77 yo female s/p Rt TSA with MD Supple protcol. Ot to follow acutely. Recommend HHOt for d/c planning    OT Assessment  Patient needs continued OT Services    Follow Up Recommendations  Home health OT    Barriers to Discharge      Equipment Recommendations  None recommended by OT    Recommendations for Other Services    Frequency  Min 2X/week    Precautions / Restrictions Precautions Precautions: Shoulder Type of Shoulder Precautions: Pendulum exercises/ AROM hand wrist digits/ PROM shoulder/  Precaution Comments: handout placed in room Required Braces or Orthoses: Other Brace/Splint (sling) Other Brace/Splint: sling Restrictions Weight Bearing Restrictions: Yes RUE Weight Bearing: Non weight bearing   Pertinent Vitals/Pain     ADL  Eating/Feeding: Set up Where Assessed - Eating/Feeding: Edge of bed Grooming: Wash/dry face;Set up Where Assessed - Grooming: Unsupported sitting ADL Comments: Pt completed supine<>sit EOB and scooting toward HOB. pt with nerve block still in place and only able to wiggle digits at this time. Pt educated on proper positioning in bed, applied ice, proper sling position and AROM hand/digits wrist. Pt tolerated EOB sitting for ~5 minutes to eat jello    OT Diagnosis:    OT Problem List: Decreased strength;Decreased knowledge of use of DME or AE;Decreased knowledge of precautions OT Treatment Interventions: Self-care/ADL training;Therapeutic exercise;DME and/or AE instruction;Therapeutic activities;Patient/family education;Balance training   OT Goals Acute Rehab OT Goals OT Goal Formulation: With patient Time For Goal Achievement: 02/22/13 Potential to Achieve Goals: Good ADL Goals Pt Will Perform Upper Body  Bathing: with set-up;Sit to stand from chair ADL Goal: Upper Body Bathing - Progress: Goal set today Pt Will Perform Upper Body Dressing: with set-up;Sit to stand from chair ADL Goal: Upper Body Dressing - Progress: Goal set today Pt Will Transfer to Toilet: with set-up;Ambulation;3-in-1 ADL Goal: Toilet Transfer - Progress: Goal set today Miscellaneous OT Goals Miscellaneous OT Goal #1: Pt will complete pendulum exercise supervision level with family (A) OT Goal: Miscellaneous Goal #1 - Progress: Goal set today  Visit Information  Last OT Received On: 02/08/13 Assistance Needed: +1    Subjective Data  Subjective: "i was so anxious earlier but they gave me some medication" Patient Stated Goal: to return home with grandson (A) Angelica Wood   Prior Functioning     Home Living Lives With: Alone;Other (Comment) Type of Home: House Home Access: Stairs to enter [employer] of Steps: 2 Entrance Stairs-Rails: None Home Layout: One level Bathroom Shower/Tub: Tub/shower unit;Door Dentist: None Prior Function Level of Independence: Independent Able to Take Stairs?: Yes Driving: Yes Vocation: Retired Musician: No difficulties Dominant Hand: Right         Vision/Perception Vision - History Baseline Vision: Wears glasses all the time Patient Visual Report: No change from baseline   Cognition  Cognition Overall Cognitive Status: Appears within functional limits for tasks assessed/performed Arousal/Alertness: Awake/alert Orientation Level: Appears intact for tasks assessed Behavior During Session: [facility] for tasks performed    Extremity/Trunk Assessment Right Upper Extremity Assessment RUE ROM/Strength/Tone: Deficits;Due to precautions Left Upper Extremity Assessment LUE ROM/Strength/Tone: Within functional levels LUE Sensation: WFL - Light Touch LUE Coordination: WFL - gross/fine motor Trunk Assessment Trunk  Assessment: Normal     Mobility Bed Mobility Bed Mobility:  Supine to Sit;Sitting - Scoot to Edge of Bed Supine to Sit: 4: Min assist;HOB elevated Sitting - Scoot to [place] of Bed: 4: Min assist Details for Bed Mobility Assistance: exiting on Lt side, educated on NWB Rt UE Transfers Transfers: Not assessed     Exercise Donning/doffing sling/immobilizer: Moderate assistance Correct positioning of sling/immobilizer: Minimal assistance ROM for elbow, wrist and digits of operated UE: Maximal assistance   Balance Static Sitting Balance Static Sitting - Balance Support: Feet supported Static Sitting - Level of Assistance: 5: Stand by assistance   End of Session OT - End of Session Activity Tolerance: Patient tolerated treatment well Patient left: in bed;with call bell/phone within reach Nurse Communication: Mobility status;Precautions  GO     Lucile Shutters 02/08/2013, 3:34 PM Pager: [phone]

## 2013-02-08 NOTE — Progress Notes (Signed)
CBG 237 

## 2013-02-08 NOTE — Progress Notes (Signed)
Report received from Koleen Nimrod, RN and accepted care of patient.

## 2013-02-08 NOTE — Op Note (Signed)
NAMECHANTILLY, LINSKEY NO.:  [id number]  MEDICAL RECORD NO.:  [id number]  LOCATION:  [id number]                        FACILITY:  [facility]  PHYSICIAN:  Vania Rea. Taven Strite, M.D.  DATE OF BIRTH:  02/13/1936  DATE OF PROCEDURE:  02/08/2013 DATE OF DISCHARGE:                              OPERATIVE REPORT   PREOPERATIVE DIAGNOSIS:  End-stage right shoulder osteoarthritis.  POSTOPERATIVE DIAGNOSIS:  End-stage right shoulder osteoarthritis.  PROCEDURE:  Right total shoulder arthroplasty utilizing a press-fit size 12 [employer] stem with a 48 x 21 eccentric humeral head and a cemented pegged 44 glenoid.  SURGEON:  Vania Rea. Lakoda Raske, MD  ASSISTANT:  Lucita Lora. Shuford, PA-C  ANESTHESIA:  General endotracheal as well as an interscalene block.  ESTIMATED BLOOD LOSS:  150 mL.  DRAINS:  None.  HISTORY:  Ms. Randleman is a 77 year old female who has had chronic and progressively increasing right shoulder pain related to end-stage osteoarthritis.  She is status post a left total shoulder arthroplasty and done very well and brought down to the operating for planned right total shoulder arthroplasty.  Preoperatively, I counseled Ms. Schurman on treatment options as well as risks versus benefits thereof.  Possible surgical complications were reviewed including potential for bleeding, infection, neurovascular injury, persistent pain, loss of motion, anesthetic complication, failed implant, possible need for additional surgery.  She understands and accepts and agrees with our planned procedure.  PROCEDURE IN DETAIL:  After undergoing routine preop evaluation, the patient received prophylactic antibiotics and an interscalene block was established in the holding area by the Anesthesia Department.  Placed supine on the operating table, underwent smooth induction of a general endotracheal anesthesia.  Placed into beach-chair position and appropriately padded and protected.  Right shoulder  examination did reveal severe crepitus with passive motion.  The right shoulder girdle region was then sterilely prepped and draped in standard fashion.  Time- out was called.  An anterior deltopectoral approach to the right shoulder was made through a 15 cm incision.  Skin flaps were elevated. Electrocautery was used for hemostasis.  The cephalic vein was identified and retracted laterally with the deltoid.  The deltopectoral interval was then developed with combination of blunt and sharp dissection.  Self-retaining retractors were placed.  Upper cm and a half of the pec major was tenotomized to enhance exposure.  The conjoined tendon was identified, mobilized, and retracted medially.  Adhesions of subacromial/subdeltoid bursal region were also divided.  The bicipital groove was identified, unroofed, and the biceps tendon was tenotomized for later tenodesis.  We then divided the subscapularis from the lesser tuberosity leaving a cm to cm and a half cuff of distal tissue for later repair and also split the rotator interval to the base of the coracoid and then mobilized the subscap, tagged the free margin with a series of grasping #2 FiberWire sutures.  We then performed an anterior and inferior capsular release allowing delivery of the humeral head through the wound and then carefully protected the rotator cuff superiorly and posteriorly.  We then used an extramedullary guide to perform a humeral head resection at appropriate level to protect the rotator cuff and the humeral head resection was  performed with an oscillating saw.  We then used hand reamers to gain access to the humeral medullary canal and this was completed up to size 12.  We then used our cookie cutter broach to established initial positioning of the stem at approximately 30 degrees of retroversion mimicking the native retroversion and we broached up to size 12 with excellent fit.  We then removed the prominent  osteophytes at the base of the humeral head anteriorly and inferiorly.  At this point, a metal cap was placed over the cut surface of the proximal humerus and we exposed the glenoid with combination of Fukuda, pitchfork, and snake tongue retractors.  Performed a circumferential labral resection including the proximal stump of the biceps tendon.  We gained circumferential exposure and also mobilized the subscapularis. At this point, then a central guide pin was placed into the center of the glenoid.  This was reamed with the 44 reamer and then the residual peripheral bone and osteophytes removed with a rongeur.  We then placed a central drill hole in the peripheral peg holes in standard fashion. We irrigated the glenoid and dried it and then mixed the cement and at the appropriate consistency, we introduced the cement into the peripheral peg holes and the final glenoid was then impacted obtaining excellent fit and fixation with overall construct much to our satisfaction.  After the cement had hardened, we then returned our attention to the proximal humerus where we impacted the stem using a retrieved bone graft from the reamings and seated the size 12 to the proper depth with excellent fixation.  We then performed trial reductions and ultimately the 48 x 21 eccentric head had the best soft tissue balance and fit.  The final humeral head was then impacted onto the [facility] taper after it was meticulously cleaned and dried.  Final reduction was performed.  Again, the soft tissue balance was much to our satisfaction.  At this point, we repaired the subscapularis back to the lesser tuberosity through the cuff of soft tissue with #2 FiberWires and then this easily allowed 30 degrees of external rotation.  We then closed the rotator interval with a pair of figure-of-eight #2 FiberWire sutures with excellent soft tissue balance and much to our satisfaction. At this point, the wound was then  irrigated.  Hemostasis was obtained. The deltopectoral interval was reapproximated with a series of 0 Vicryl sutures, 2-0 Vicryl for the subcu layer, and intracuticular 3-0 Monocryl for the skin followed by Steri-Strips.  Dry dressing was applied.  Ralene Bathe, PA-C was used as an Geophysicist/field seismologist throughout this case essential for help with positioning of extremity, soft tissue retraction, manipulation of the joint and implants, wound closure and intraoperative decision making.  At this point, the right arm was placed in sling.  The patient was then awakened, extubated, and taken to the recovery room in stable condition.     Vania Rea. Shoshannah Faubert, M.D.     KMS/MEDQ  D:  02/08/2013  T:  02/08/2013  Job:  [id number]

## 2013-02-08 NOTE — H&P (Signed)
Angelica Wood    Chief Complaint: osteoarthritis of the right shoulder HPI: The patient is a 77 y.o. female with endstage right shoulder OA  Past Medical History  Diagnosis Date  . Diabetes mellitus   . Chronic fatigue   . PONV (postoperative nausea and vomiting)   . Hypertension   . Thyroid disease   . Pneumonia     hx of  . Bronchitis     hx of  . Sleep apnea     uses cpap sparingly  . Urinary incontinence   . Irritable bowel syndrome (IBS)     hx of  . GERD (gastroesophageal reflux disease)   . Migraine     hx of  . Fibromyalgia   . Anemia     hx of  . Arthritis   . Shortness of breath     with exertion  . Hypothyroidism   . Vertigo     hx of  . Urgency of urination     Past Surgical History  Procedure Laterality Date  . Appendectomy    . Abdominal hysterectomy    . Cholecystectomy    . Tonsillectomy    . Eye surgery      right cataract removed, laser surgery for glaucoma  . Dilation and curettage of uterus    . Total shoulder arthroplasty  06/29/2012    Procedure: TOTAL SHOULDER ARTHROPLASTY;  Surgeon: Senaida Lange, MD;  Location: [facility] OR;  Service: Orthopedics;  Laterality: Left;  left total shoulder arthroplasty  . Colonoscopy w/ polypectomy      History reviewed. No pertinent family history.  Social History:  reports that she has quit smoking. She does not have any smokeless tobacco history on file. She reports that she does not drink alcohol or use illicit drugs.  Allergies:  Allergies  Allergen Reactions  . Glucophage (Metformin Hydrochloride) Diarrhea  . Macrodantin (Nitrofurantoin Macrocrystal) Other (See Comments)    Whelps    . Prednisone Nausea And Vomiting  . Sulfa Antibiotics Other (See Comments)    Whelps    . Clindamycin Hcl Rash    All over torso     Medications Prior to Admission  Medication Sig Dispense Refill  . albuterol (PROVENTIL HFA;VENTOLIN HFA) 108 (90 BASE) MCG/ACT inhaler Inhale 2 puffs into the lungs every 6 (six)  hours as needed for wheezing.      . cetirizine (ZYRTEC) 10 MG tablet Take 10 mg by mouth daily.      . DULoxetine (CYMBALTA) 60 MG capsule Take 60 mg by mouth 2 (two) times daily.      . insulin glargine (LANTUS) 100 UNIT/ML injection Inject 28 Units into the skin at bedtime.      Marland Kitchen levothyroxine (SYNTHROID, LEVOTHROID) 100 MCG tablet Take 100 mcg by mouth daily.      Marland Kitchen losartan-hydrochlorothiazide (HYZAAR) 100-12.5 MG per tablet Take 1 tablet by mouth daily.      . metoprolol (LOPRESSOR) 50 MG tablet Take 50 mg by mouth 2 (two) times daily.      Marland Kitchen OVER THE COUNTER MEDICATION Take 1 tablet by mouth 3 (three) times daily. Zyflamend tablets antiinflammatory      . oxyCODONE-acetaminophen (PERCOCET) 5-325 MG per tablet Take 1 tablet by mouth 4 (four) times daily as needed. For pain      . RABEprazole (ACIPHEX) 20 MG tablet Take 20 mg by mouth 2 (two) times daily.          Physical Exam: right shoulder with painful and  restricted motion as noted at most recent office visit  Vitals  Temp:  [98.2 F (36.8 C)] 98.2 F (36.8 C) (02/27 0607) Pulse Rate:  [66] 66 (02/27 0607) Resp:  [18] 18 (02/27 0607) BP: (108)/(74) 108/74 mmHg (02/27 0607) SpO2:  [100 %] 100 % (02/27 0607)  Assessment/Plan  Impression: osteoarthritis of the right shoulder  Plan of Action: Procedure(s): TOTAL SHOULDER ARTHROPLASTY  Turki Tapanes M 02/08/2013, 7:34 AM

## 2013-02-08 NOTE — Anesthesia Procedure Notes (Addendum)
Procedure Name: Intubation Date/Time: 02/08/2013 7:44 AM Performed by: Lovie Chol Pre-anesthesia Checklist: Patient identified, Emergency Drugs available, Suction available, Patient being monitored and Timeout performed Patient Re-evaluated:Patient Re-evaluated prior to inductionOxygen Delivery Method: Circle system utilized Preoxygenation: Pre-oxygenation with 100% oxygen Intubation Type: IV induction Ventilation: Mask ventilation without difficulty Laryngoscope Size: Miller and 2 Grade View: Grade I Tube type: Oral Tube size: 7.5 mm Number of attempts: 1 Airway Equipment and Method: Stylet and LTA kit utilized Placement Confirmation: ETT inserted through vocal cords under direct vision,  positive ETCO2,  CO2 detector and breath sounds checked- equal and bilateral Secured at: 21 cm Tube secured with: Tape Dental Injury: Teeth and Oropharynx as per pre-operative assessment    Anesthesia Regional Block:  Interscalene brachial plexus block  Pre-Anesthetic Checklist: ,, timeout performed, Correct Patient, Correct Site, Correct Laterality, Correct Procedure, Correct Position, site marked, Risks and benefits discussed,  Surgical consent,  Pre-op evaluation,  At surgeon's request and post-op pain management  Laterality: Right  Prep: chloraprep       Needles:   Needle Type: Other     Needle Length: 9cm  Needle Gauge: 21    Additional Needles:  Procedures: ultrasound guided (picture in chart) Interscalene brachial plexus block Narrative:  Start time: 02/08/2013 7:01 AM End time: 02/08/2013 7:08 AM Injection made incrementally with aspirations every 5 mL.  Performed by: Personally  Anesthesiologist: Aldona Lento, MD  Additional Notes: Ultrasound guidance used to: id relevant anatomy, confirm needle position, local anesthetic spread, avoidance of vascular puncture. Picture saved. No complications. Block performed personally by Janetta Hora. Gelene Mink, MD    Interscalene  brachial plexus block

## 2013-02-08 NOTE — Progress Notes (Signed)
CARE MANAGEMENT NOTE 02/08/2013  Patient:  Angelica Wood, Angelica Wood   Account Number:  [id number]  Date Initiated:  02/08/2013  Documentation initiated by:  Vance Peper  Subjective/Objective Assessment:   77 yr old female s/p right total shoulder arthroplasty.     Action/Plan:   no home health needs identified   Anticipated DC Date:  02/09/2013   Anticipated DC Plan:  HOME/SELF CARE      DC Planning Services  CM consult      Choice offered to / List presented to:             Status of service:  Completed, signed off Medicare Important Message given?   (If response is "NO", the following Medicare IM given date fields will be blank) Date Medicare IM given:   Date Additional Medicare IM given:    Discharge Disposition:  HOME/SELF CARE  Per UR Regulation:    If discussed at Long Length of Stay Meetings, dates discussed:    Comments:

## 2013-02-08 NOTE — Transfer of Care (Signed)
Immediate Anesthesia Transfer of Care Note  Patient: Angelica Wood  Procedure(s) Performed: Procedure(s): TOTAL SHOULDER ARTHROPLASTY (Right)  Patient Location: PACU  Anesthesia Type:General  Level of Consciousness: oriented, sedated and patient cooperative  Airway & Oxygen Therapy: Patient Spontanous Breathing and Patient connected to face mask oxygen  Post-op Assessment: Report given to PACU RN and Post -op Vital signs reviewed and stable  Post vital signs: Reviewed and stable  Complications: No apparent anesthesia complications

## 2013-02-08 NOTE — Preoperative (Signed)
Beta Blockers   Reason not to administer Beta Blockers:Not Applicable 

## 2013-02-08 NOTE — Anesthesia Preprocedure Evaluation (Addendum)
Anesthesia Evaluation  Patient identified by MRN, date of birth, ID band Patient awake    Reviewed: Allergy & Precautions, H&P , NPO status , Patient's Chart, lab work & pertinent test results, reviewed documented beta blocker date and time   History of Anesthesia Complications (+) PONV  Airway Mallampati: II TM Distance: >3 FB Neck ROM: full    Dental  (+) Teeth Intact and Dental Advisory Given   Pulmonary shortness of breath, sleep apnea , pneumonia -, resolved, former smoker,  breath sounds clear to auscultation        Cardiovascular hypertension, On Home Beta Blockers and On Medications Rhythm:regular Rate:Normal     Neuro/Psych  Headaches, PSYCHIATRIC DISORDERS  Neuromuscular disease    GI/Hepatic Neg liver ROS, GERD-  Medicated and Controlled,  Endo/Other  diabetes, Type 2, Insulin DependentHypothyroidism   Renal/GU negative Renal ROS  negative genitourinary   Musculoskeletal   Abdominal   Peds  Hematology  (+) anemia ,   Anesthesia Other Findings See surgeon's H&P   Reproductive/Obstetrics negative OB ROS                          Anesthesia Physical Anesthesia Plan  ASA: III  Anesthesia Plan: General   Post-op Pain Management:    Induction: Intravenous  Airway Management Planned: Oral ETT  Additional Equipment:   Intra-op Plan:   Post-operative Plan: Extubation in OR  Informed Consent: I have reviewed the patients History and Physical, chart, labs and discussed the procedure including the risks, benefits and alternatives for the proposed anesthesia with the patient or authorized representative who has indicated his/her understanding and acceptance.   Dental Advisory Given  Plan Discussed with: CRNA and Surgeon  Anesthesia Plan Comments:         Anesthesia Quick Evaluation

## 2013-02-08 NOTE — Op Note (Signed)
02/08/2013  9:44 AM  PATIENT:   Angelica Wood  77 y.o. female  PRE-OPERATIVE DIAGNOSIS:  osteoarthritis of the right shoulder  POST-OPERATIVE DIAGNOSIS:  SAME  PROCEDURE:  Right total shoulder arthroplasty  SURGEON:  Kyler Lerette, Vania Rea. M.D.  ASSISTANTS: Shuford pac   ANESTHESIA:   GET + ISB  EBL: 250cc  SPECIMEN:  none  Drains: none   PATIENT DISPOSITION:  PACU - hemodynamically stable.    PLAN OF CARE: Admit to inpatient   Dictation# [id number]

## 2013-02-08 NOTE — Anesthesia Postprocedure Evaluation (Signed)
Anesthesia Post Note  Patient: Angelica Wood  Procedure(s) Performed: Procedure(s) (LRB): TOTAL SHOULDER ARTHROPLASTY (Right)  Anesthesia type: general  Patient location: PACU  Post pain: Pain level controlled  Post assessment: Patient's Cardiovascular Status Stable  Last Vitals:  Filed Vitals:   02/08/13 1030  BP: 103/80  Pulse: 67  Temp:   Resp: 17    Post vital signs: Reviewed and stable  Level of consciousness: sedated  Complications: No apparent anesthesia complications

## 2013-02-09 LAB — GLUCOSE, CAPILLARY
Glucose-Capillary: 122 mg/dL — ABNORMAL HIGH (ref 70–99)
Glucose-Capillary: 179 mg/dL — ABNORMAL HIGH (ref 70–99)
Glucose-Capillary: 225 mg/dL — ABNORMAL HIGH (ref 70–99)

## 2013-02-09 MED ORDER — OXYCODONE-ACETAMINOPHEN 5-325 MG PO TABS: 1.0000 | ORAL_TABLET | ORAL | Status: DC | PRN

## 2013-02-09 MED ORDER — TIZANIDINE HCL 4 MG PO CAPS: 4.0000 mg | ORAL_CAPSULE | Freq: Three times a day (TID) | ORAL | Status: DC | PRN

## 2013-02-09 NOTE — Discharge Summary (Signed)
PATIENT ID:      Angelica Wood  MRN:     [id number] DOB/AGE:    06-03-1936 / 77 y.o.     DISCHARGE SUMMARY  ADMISSION DATE:    02/08/2013 DISCHARGE DATE:  02/09/2013  ADMISSION DIAGNOSIS: osteoarthritis of the right shoulder Past Medical History  Diagnosis Date  . Diabetes mellitus   . Chronic fatigue   . PONV (postoperative nausea and vomiting)   . Hypertension   . Thyroid disease   . Pneumonia     hx of  . Bronchitis     hx of  . Sleep apnea     uses cpap sparingly  . Urinary incontinence   . Irritable bowel syndrome (IBS)     hx of  . GERD (gastroesophageal reflux disease)   . Migraine     hx of  . Fibromyalgia   . Anemia     hx of  . Arthritis   . Shortness of breath     with exertion  . Hypothyroidism   . Vertigo     hx of  . Urgency of urination     DISCHARGE DIAGNOSIS:   Active Problems:   Shoulder arthritis   PROCEDURE: Procedure(s): TOTAL SHOULDER ARTHROPLASTY on 02/08/2013  CONSULTS:   none  HISTORY:  See H&P in chart.  HOSPITAL COURSE:  Angelica Wood is a 77 y.o. admitted on 02/08/2013 with a chief complaint of Right shoulder pain, and found to have a diagnosis of osteoarthritis of the right shoulder.  They were brought to the operating room on 02/08/2013 and underwent Procedure(s): TOTAL SHOULDER ARTHROPLASTY.    They were given perioperative antibiotics: Anti-infectives   Start     Dose/Rate Route Frequency Ordered Stop   02/08/13 1400  ceFAZolin (ANCEF) IVPB 1 g/50 mL premix     1 g 100 mL/hr over 30 Minutes Intravenous Every 6 hours 02/08/13 1124 02/09/13 0322   02/08/13 0736  vancomycin (VANCOCIN) 1 GM/200ML IVPB    Comments:  BUTLER, DAVE: cabinet override      02/08/13 0736 02/08/13 0750   02/08/13 0731  ceFAZolin (ANCEF) 2-3 GM-% IVPB SOLR    Comments:  ROCK, JENNIFER: cabinet override      02/08/13 0731 02/08/13 0745   02/08/13 0730  ceFAZolin (ANCEF) IVPB 2 g/50 mL premix     2 g 100 mL/hr over 30 Minutes Intravenous  Once 02/08/13  0729 02/08/13 0735   02/07/13 1434  ceFAZolin (ANCEF) 3 g in dextrose 5 % 50 mL IVPB  Status:  Discontinued     3 g 160 mL/hr over 30 Minutes Intravenous 60 min pre-op 02/07/13 1434 02/08/13 1109    .  Patient underwent the above named procedure and tolerated it well. The following day they were hemodynamically stable and pain was controlled on oral analgesics. They were neurovascularly intact to the operative extremity. OT was ordered and worked with patient per protocol. They were medically and orthopaedically stable for discharge on POD 1. Home health arrangements were made     DIAGNOSTIC STUDIES:  RECENT RADIOGRAPHIC STUDIES :  No results found.  RECENT VITAL SIGNS:  Patient Vitals for the past 24 hrs:  BP Temp Temp src Pulse Resp SpO2  02/09/13 0650 141/75 mmHg 98.1 F (36.7 C) Oral 84 20 98 %  02/08/13 2111 145/93 mmHg 97.3 F (36.3 C) Oral 90 19 97 %  02/08/13 1530 150/82 mmHg 97.6 F (36.4 C) Oral 84 20 98 %  02/08/13 1300 140/64 mmHg 97.9  F (36.6 C) Oral 73 18 97 %  02/08/13 1049 - 97.5 F (36.4 C) - 56 17 100 %  02/08/13 1046 145/61 mmHg - - - - -  02/08/13 1030 103/80 mmHg - - 67 17 100 %  02/08/13 1020 - - - 62 16 100 %  02/08/13 1016 147/62 mmHg - - - - -  02/08/13 1000 148/53 mmHg 97.5 F (36.4 C) - 59 14 100 %  .  RECENT EKG RESULTS:    Orders placed during the hospital encounter of 06/22/12  . EKG 12-LEAD  . EKG 12-LEAD    DISCHARGE INSTRUCTIONS:    DISCHARGE MEDICATIONS:     Medication List    TAKE these medications       albuterol 108 (90 BASE) MCG/ACT inhaler  Commonly known as:  PROVENTIL HFA;VENTOLIN HFA  Inhale 2 puffs into the lungs every 6 (six) hours as needed for wheezing.     cetirizine 10 MG tablet  Commonly known as:  ZYRTEC  Take 10 mg by mouth daily.     DULoxetine 60 MG capsule  Commonly known as:  CYMBALTA  Take 60 mg by mouth 2 (two) times daily.     insulin glargine 100 UNIT/ML injection  Commonly known as:  LANTUS   Inject 28 Units into the skin at bedtime.     levothyroxine 100 MCG tablet  Commonly known as:  SYNTHROID, LEVOTHROID  Take 100 mcg by mouth daily.     losartan-hydrochlorothiazide 100-12.5 MG per tablet  Commonly known as:  HYZAAR  Take 1 tablet by mouth daily.     metoprolol 50 MG tablet  Commonly known as:  LOPRESSOR  Take 50 mg by mouth 2 (two) times daily.     OVER THE COUNTER MEDICATION  Take 1 tablet by mouth 3 (three) times daily. Zyflamend tablets antiinflammatory     oxyCODONE-acetaminophen 5-325 MG per tablet  Commonly known as:  PERCOCET/ROXICET  Take 1 tablet by mouth 4 (four) times daily as needed. For pain     oxyCODONE-acetaminophen 5-325 MG per tablet  Commonly known as:  PERCOCET/ROXICET  Take 1-2 tablets by mouth every 4 (four) hours as needed.     RABEprazole 20 MG tablet  Commonly known as:  ACIPHEX  Take 20 mg by mouth 2 (two) times daily.     tiZANidine 4 MG capsule  Commonly known as:  ZANAFLEX  Take 1 capsule (4 mg total) by mouth 3 (three) times daily as needed for muscle spasms.        FOLLOW UP VISIT:       Follow-up Information   Follow up with SUPPLE,KEVIN M, MD. (call to be seen 10-14 days post op)    Contact information:   [address], [address] [place] [address] [phone]       DISCHARGE BJ:YNWG  DISPOSITION: Good  DISCHARGE CONDITION:  Rodolph Bong for Dr. Francena Hanly 02/09/2013, 9:09 AM

## 2013-02-09 NOTE — Progress Notes (Signed)
NCM spoke to pt and offered choice. States she had [facility] in the past for [facility]. NCM contacted [facility] and they did not have a 24-48 soc. They are a week out for [facility] PT/OT. Pt agreeable to [facility] for [facility]. NCM contacted [facility] for [facility]. Spoke to [facility] rep and they can provide soc within 24-48 hours. [facility] contact info added to dc instruction. NCM spoke to attending PA and clarified orders for [facility]. Isidoro Donning RN CCM Case Mgmt phone [phone]

## 2013-02-09 NOTE — Progress Notes (Signed)
Occupational Therapy Treatment Patient Details Name: JOANIE DUPREY MRN: [id number] DOB: [birth year] Today's Date: 02/09/2013 Time: 1010-1030 OT Time Calculation (min): 20 min  OT Assessment / Plan / Recommendation Comments on Treatment Session pt. scheduled for d/c home today. pt. and dtr. declined review of shoulder rom/exercises due to they state they remember from previous surgery on opposite ue "what to do", also reviewed donning/doffing sling and they also declined that stating they know how to do that as well.  state they have no questions or concerns.  provided demo agian of pendulum and dtr. verbalized understanding as pt. was in/out of sleep due to recent pain meds    Follow Up Recommendations  Home health OT    Barriers to Discharge       Equipment Recommendations       Recommendations for Other Services    Frequency Min 2X/week   Plan Discharge plan remains appropriate    Precautions / Restrictions Precautions Precautions: Shoulder Type of Shoulder Precautions: pendulum exercises/AROM to hand, wrist, digits, PROM  shoulder Precaution Comments: hand out provided preious day Other Brace/Splint: sling Restrictions Weight Bearing Restrictions: Yes RUE Weight Bearing: Non weight bearing   Pertinent Vitals/Pain No reported pain, recent meds given     ADL  ADL Comments: pt. in /out of sleep during session. dtr present and reports pt. was recently given "double dose" of pain meds.  pt. and dtr. both report no questions or concerns with shoulder precautions and pendulum exercises.  both report recent completion of therapy on opposite ue and feel they have understanding of  exercies and declined practice and review today.  dtr. reprots she will be at home to assist. also state no questions with donning sling or rom    OT Diagnosis:    OT Problem List:   OT Treatment Interventions:     OT Goals Miscellaneous OT Goals OT Goal: Miscellaneous Goal #1 - Progress: Progressing  toward goals  Visit Information  Last OT Received On: 02/09/13    Subjective Data  Subjective: "honey it hasn't been long since i quit therapy on the other shoulder"   Prior Functioning       Cognition  Cognition Overall Cognitive Status: Appears within functional limits for tasks assessed/performed Arousal/Alertness: Lethargic Orientation Level: Appears intact for tasks assessed Behavior During Session: [facility] for tasks performed    Mobility       Exercises      Balance     End of Session OT - End of Session Activity Tolerance: Patient limited by fatigue Patient left: in bed;with call bell/phone within reach;with family/visitor present  GO     Robet Leu 02/09/2013, 10:36 AM

## 2013-02-11 ENCOUNTER — Encounter (HOSPITAL_COMMUNITY): Payer: Self-pay | Admitting: Orthopedic Surgery

## 2013-02-27 ENCOUNTER — Ambulatory Visit (HOSPITAL_COMMUNITY)
Admission: RE | Admit: 2013-02-27 | Discharge: 2013-02-27 | Disposition: A | Discharge status: Home / Self Care | Payer: Medicare Other | Source: Ambulatory Visit | Attending: Orthopedic Surgery | Admitting: Orthopedic Surgery

## 2013-02-27 DIAGNOSIS — M6281 Muscle weakness (generalized): Secondary | ICD-10-CM | POA: Insufficient documentation

## 2013-02-27 DIAGNOSIS — IMO0001 Reserved for inherently not codable concepts without codable children: Secondary | ICD-10-CM | POA: Insufficient documentation

## 2013-02-27 DIAGNOSIS — M25619 Stiffness of unspecified shoulder, not elsewhere classified: Secondary | ICD-10-CM | POA: Insufficient documentation

## 2013-02-27 DIAGNOSIS — Z96619 Presence of unspecified artificial shoulder joint: Secondary | ICD-10-CM | POA: Insufficient documentation

## 2013-02-27 DIAGNOSIS — M25519 Pain in unspecified shoulder: Secondary | ICD-10-CM | POA: Insufficient documentation

## 2013-02-27 NOTE — Evaluation (Signed)
Occupational Therapy Evaluation  Patient Details  Name: Angelica Wood MRN: [id number] Date of Birth: July 09, 1936  Today's Date: 02/27/2013 Time: 1430-1530 OT Time Calculation (min): 60 min OT Eval 30' Manual Therapy 20' Heat 10' Visit#: 1 of 24  Re-eval: 03/27/13   Diagnosis: S/P Right Total Shoulder Replacement  Authorization: [employer]  Authorization Time Period: before 10th visit  Authorization Visit#: 1 of 10   Past Medical History:  Past Medical History  Diagnosis Date  . Diabetes mellitus   . Chronic fatigue   . PONV (postoperative nausea and vomiting)   . Hypertension   . Thyroid disease   . Pneumonia     hx of  . Bronchitis     hx of  . Sleep apnea     uses cpap sparingly  . Urinary incontinence   . Irritable bowel syndrome (IBS)     hx of  . GERD (gastroesophageal reflux disease)   . Migraine     hx of  . Fibromyalgia   . Anemia     hx of  . Arthritis   . Shortness of breath     with exertion  . Hypothyroidism   . Vertigo     hx of  . Urgency of urination    Past Surgical History:  Past Surgical History  Procedure Laterality Date  . Appendectomy    . Abdominal hysterectomy    . Cholecystectomy    . Tonsillectomy    . Eye surgery      right cataract removed, laser surgery for glaucoma  . Dilation and curettage of uterus    . Total shoulder arthroplasty  06/29/2012    Procedure: TOTAL SHOULDER ARTHROPLASTY;  Surgeon: Senaida Lange, MD;  Location: [facility] OR;  Service: Orthopedics;  Laterality: Left;  left total shoulder arthroplasty  . Colonoscopy w/ polypectomy    . Total shoulder arthroplasty Right 02/08/2013    Procedure: TOTAL SHOULDER ARTHROPLASTY;  Surgeon: Senaida Lange, MD;  Location: [facility] OR;  Service: Orthopedics;  Laterality: Right;    Subjective S:  I want to be able to get back to golfing and canning.  Pertinent History: Angelica Wood had a right total shoulder replacement on  02/08/13 due to significant osteoarthritis.  She was  discharged from the hospital on March 2 and recieved home health therapy for her right shoulder.  Dr. Rennis Chris has referred her to occupational therapy for evaluation and treatment.  She had her left shoulder replaced in July of 2013 and recovered nicely.   Limitations: Protocol scanned into notes section of this chart.  PROM only through 3/27, limiting flexion to 90, external and internal rotation to 30, elbow - hand AROM, isometrics, pulleys. Special Tests: DASH:  scored 50 with ideal score being 0. Patient Stated Goals: I want to get my right arm back to normal so that I can golf and can foods.  Pain Assessment Pain Score: 0-No pain  Precautions/Restrictions  Precautions Precautions: Shoulder Type of Shoulder Precautions: PROM through 03/08/13 with limitations to 90 for flexion and 30 for external and internal rotation  Balance Screening Balance Screen Has the patient fallen in the past 6 months: No Has the patient had a decrease in activity level because of a fear of falling? : No Is the patient reluctant to leave their home because of a fear of falling? : No  Prior Functioning  Home Living Lives With: Family Prior Function Driving: Yes Vocation: Retired Leisure: Hobbies-yes (Comment) Comments: enjoys golfing and canning vegetables  Assessment ADL/Vision/Perception ADL ADL Comments: Patient is not able to use her dominant right arm actively above shoulder height with daily activities.  She is not able to complete IADLs such as sweeping, vacuuming, and mopping, nor can she fold laundry Dominant Hand: Right  Cognition/Observation Cognition Overall Cognitive Status: Appears within functional limits for tasks assessed Observation/Other Assessments Observations: typical incision along anterior shoulder  Sensation/Coordination/Edema Sensation Light Touch: Appears Intact  Additional Assessments RUE AROM (degrees) RUE Overall AROM Comments: not assessed, per protocol RUE PROM  (degrees) RUE Overall PROM Comments: assessed in supine, external and internal rotation with shoulder adducted Right Shoulder Flexion: 95 Degrees Right Shoulder ABduction: 71 Degrees Right Shoulder Internal Rotation: 73 Degrees Right Shoulder External Rotation: 20 Degrees RUE Strength RUE Overall Strength Comments: not assessed per protocol Palpation Palpation: mod-max fascial restrictions in her right upper arm, scapular, pectoral, and shoulder region.      Exercise/Treatments     Modalities Modalities: Moist Heat Manual Therapy Manual Therapy: Myofascial release Myofascial Release: Myofascial release and manual stretching to right upper arm, shoulder, and scapular region to decrease pain and fascial restrictions and increase pain free mobility.   Moist Heat Therapy Number Minutes Moist Heat: 10 Minutes Moist Heat Location: Shoulder  Occupational Therapy Assessment and Plan OT Assessment and Plan Clinical Impression Statement: A:  Patient presents s/p RIght total shoulder replacement with decreased PROM and strength and increased pain and fascial restrictions. These deficts are causing decreased independence with daily activities.   Pt will benefit from skilled therapeutic intervention in order to improve on the following deficits: Decreased activity tolerance;Decreased range of motion;Decreased strength;Increased fascial restricitons;Increased muscle spasms;Pain Rehab Potential: Good OT Frequency: Min 2X/week OT Duration: 12 weeks OT Treatment/Interventions: Self-care/ADL training;Therapeutic exercise;Modalities;Manual therapy;Therapeutic activities;Patient/family education OT Plan: P:  Skilled OT intervention to decrease pain and fascial restrictions and increase pain free mobility and strength in her right shoulder for increased independence with daily activities.  Treatment Plan:  FOLLOW PROTOCOL:  supine Myofascial release, PROM, isometric strengthening, bridges.  seated AROM of  elbow - hand, grip strengthening, elev, ext, row.  pulleys.   Goals Short Term Goals Time to Complete Short Term Goals: 6 weeks Short Term Goal 1: Patient will be educated on a HEP. Short Term Goal 2: Patient will increase PROM to [facility] for increased independence with donning and doffing jackets. Short Term Goal 3: Patient will increase her right shoulder strength to 3+/5 for increased ability to lift laundry baskets. Short Term Goal 4: Patient will decrease the pain level in her right shoulder to 3/10 with activity. Short Term Goal 5: Patient will decrease fascial restrictions from mod-max to moderate in her right shoulder region for greater mobility with B/IADLs.  Long Term Goals Time to Complete Long Term Goals: 12 weeks Long Term Goal 1: Patient will return to prior level of independence with all desired activities and be able to participate in canning and play golf. Long Term Goal 2: Patient will increase right shoulder AROM to [facility] for increased independence with playing golf. Long Term Goal 3: Patient will increase her right shoulder strength to 4+/5 for increased ability to lift materials when canning.  Long Term Goal 4: Patient will decrease the pain level in her right shoulder to 1/10 with activity. Long Term Goal 5: Patient will decrease fascial restrictions from  moderate  to minimal in her right shoulder region for greater mobility with B/IADLs.   Problem List Patient Active Problem List  Diagnosis  . Shoulder arthritis  .  H/O total shoulder replacement  . Pain in joint, shoulder region  . Muscle weakness (generalized)    End of Session Activity Tolerance: Patient tolerated treatment well General Behavior During Session: [facility] for tasks performed Cognition: [facility] for tasks performed OT Plan of Care OT Home Exercise Plan: Reviewed HEP and issued towel slides. Consulted and Agree with Plan of Care: Patient  GO Functional Assessment Tool Used: DASH scored 50 with ideal score  being 0. Functional Limitation: Carrying, moving and handling objects Carrying, Moving and Handling Objects Current Status [phone]): At least 40 percent but less than 60 percent impaired, limited or restricted Carrying, Moving and Handling Objects Goal Status [phone]): At least 1 percent but less than 20 percent impaired, limited or restricted  Shirlean Mylar, OTR/L  02/27/2013, 5:05 PM  Physician Documentation Your signature is required to indicate approval of the treatment plan as stated above.  Please sign and either send electronically or make a copy of this report for your files and return this physician signed original.  Please mark one 1.__approve of plan  2. ___approve of plan with the following conditions.   ______________________________                                                          _____________________ Physician Signature                                                                                                             Date

## 2013-02-28 ENCOUNTER — Ambulatory Visit (HOSPITAL_COMMUNITY)
Admission: RE | Admit: 2013-02-28 | Discharge: 2013-02-28 | Disposition: A | Discharge status: Home / Self Care | Payer: Medicare Other | Source: Ambulatory Visit | Attending: Orthopedic Surgery | Admitting: Orthopedic Surgery

## 2013-02-28 DIAGNOSIS — Z96611 Presence of right artificial shoulder joint: Secondary | ICD-10-CM

## 2013-02-28 DIAGNOSIS — M6281 Muscle weakness (generalized): Secondary | ICD-10-CM

## 2013-02-28 DIAGNOSIS — M25511 Pain in right shoulder: Secondary | ICD-10-CM

## 2013-02-28 NOTE — Progress Notes (Signed)
Occupational Therapy Treatment Patient Details  Name: Angelica Wood MRN: [id number] Date of Birth: 05-01-1936  Today's Date: 02/28/2013 Time: 4098-1191 OT Time Calculation (min): 46 min Manual Therapy 1435-1510 35' Therapeutic Exercises [phone] 11' Visit#: 2 of 24  Re-eval: 03/27/13    Authorization: [employer]  Authorization Time Period: before 10th visit  Authorization Visit#: 2 of 10  Subjective Symptoms/Limitations Symptoms: S:  I couldnt hang that coat I had on yesterday, it was too heavy.  Limitations: Protocol scanned into notes section of this chart.  PROM only through 3/27, limiting flexion to 90, external and internal rotation to 30, elbow - hand AROM, isometrics, pulleys. Pain Assessment Currently in Pain?: Yes Pain Score:   1 Pain Location: Shoulder Pain Orientation: Right Pain Type: Surgical pain  Precautions/Restrictions    Protocol scanned into notes section of this chart.  PROM only through 3/27, limiting flexion to 90, external and internal rotation to 30, elbow - hand AROM, isometrics, pulleys.   Exercise/Treatments Supine Protraction: PROM;10 reps External Rotation: PROM;10 reps Internal Rotation: PROM;10 reps Flexion: PROM;10 reps ABduction: PROM;10 reps Other Supine Exercises: bridges 10 times  Seated Elevation: AROM;10 reps Extension: AROM;10 reps Row: AROM;10 reps Therapy Ball Flexion: 10 reps ABduction: 10 reps ROM / Strengthening / Isometric Strengthening   Flexion: Supine;3X3" Extension: Supine;3X3" External Rotation: Supine;3X3" Internal Rotation: Supine;3X3" ABduction: Supine;3X3" ADduction: Supine;3X3"    Manual Therapy Manual Therapy: Myofascial release Myofascial Release: Myofascial release and manual stretching to right upper arm, shoulder, and scapular region to decrease pain and fascial restrictions and increase pain free mobility. Scar release to healed incision.   Occupational Therapy Assessment and Plan OT  Assessment and Plan Clinical Impression Statement: A:  Patient with PROM to protocol limitations this date.  Patient unable to complete ball stretches into abduction with large blue therapyball secondary to increased pain, utilized smaller green therapy ball with good results. OT Plan: P:  Add elbow - hand AROM, grip strengthening and pulley exercises.    Goals Short Term Goals Time to Complete Short Term Goals: 6 weeks Short Term Goal 1: Patient will be educated on a HEP. Short Term Goal 1 Progress: Progressing toward goal Short Term Goal 2: Patient will increase PROM to [facility] for increased independence with donning and doffing jackets. Short Term Goal 2 Progress: Progressing toward goal Short Term Goal 3: Patient will increase her right shoulder strength to 3+/5 for increased ability to lift laundry baskets. Short Term Goal 3 Progress: Progressing toward goal Short Term Goal 4: Patient will decrease the pain level in her right shoulder to 3/10 with activity. Short Term Goal 4 Progress: Progressing toward goal Short Term Goal 5: Patient will decrease fascial restrictions from mod-max to moderate in her right shoulder region for greater mobility with B/IADLs.  Long Term Goals Time to Complete Long Term Goals: 12 weeks Long Term Goal 1: Patient will return to prior level of independence with all desired activities and be able to participate in canning and play golf. Long Term Goal 1 Progress: Progressing toward goal Long Term Goal 2: Patient will increase right shoulder AROM to [facility] for increased independence with playing golf. Long Term Goal 2 Progress: Progressing toward goal Long Term Goal 3: Patient will increase her right shoulder strength to 4+/5 for increased ability to lift materials when canning.  Long Term Goal 3 Progress: Progressing toward goal Long Term Goal 4: Patient will decrease the pain level in her right shoulder to 1/10 with activity. Long Term Goal 4  Progress: Progressing  toward goal Long Term Goal 5: Patient will decrease fascial restrictions from  moderate  to minimal in her right shoulder region for greater mobility with B/IADLs.  Long Term Goal 5 Progress: Progressing toward goal  Problem List Patient Active Problem List  Diagnosis  . Shoulder arthritis  . H/O total shoulder replacement  . Pain in joint, shoulder region  . Muscle weakness (generalized)    End of Session Activity Tolerance: Patient tolerated treatment well General Behavior During Session: [facility] for tasks performed Cognition: [facility] for tasks performed  GO    Shirlean Mylar, OTR/L   02/28/2013, 3:44 PM

## 2013-03-06 ENCOUNTER — Inpatient Hospital Stay (HOSPITAL_COMMUNITY)
Admission: RE | Admit: 2013-03-06 | Discharge: 2013-03-06 | Disposition: A | Discharge status: Home / Self Care | Payer: Medicare Other | Source: Ambulatory Visit | Attending: Occupational Therapy | Admitting: Occupational Therapy

## 2013-03-06 ENCOUNTER — Ambulatory Visit (HOSPITAL_COMMUNITY)
Admission: RE | Admit: 2013-03-06 | Discharge: 2013-03-06 | Disposition: A | Discharge status: Home / Self Care | Payer: Medicare Other | Source: Ambulatory Visit | Attending: Orthopedic Surgery | Admitting: Orthopedic Surgery

## 2013-03-06 DIAGNOSIS — M25511 Pain in right shoulder: Secondary | ICD-10-CM

## 2013-03-06 DIAGNOSIS — M6281 Muscle weakness (generalized): Secondary | ICD-10-CM

## 2013-03-06 DIAGNOSIS — Z96611 Presence of right artificial shoulder joint: Secondary | ICD-10-CM

## 2013-03-06 NOTE — Progress Notes (Signed)
Occupational Therapy Treatment Patient Details  Name: Angelica Wood MRN: [id number] Date of Birth: Sep 20, 1936  Today's Date: 03/06/2013 Time: 4098-1191 OT Time Calculation (min): 53 min Manual Therapy 246-320 34' Therapeutic Exercise 321-339 18'  Visit#: 3 of 24  Re-eval: 03/27/13    Authorization: [employer]  Authorization Time Period: before 10th visit  Authorization Visit#: 3 of 10  Subjective Symptoms/Limitations Symptoms: S:  It hurts so much when I move it. Pain Assessment Currently in Pain?: Yes Pain Score:   4 Pain Location: Shoulder Pain Orientation: Right Pain Frequency: Other (Comment) (with movement)  Precautions/Restrictions     Exercise/Treatments Supine Protraction: PROM;10 reps External Rotation: PROM;10 reps Internal Rotation: PROM;10 reps Flexion: PROM;10 reps ABduction: PROM;10 reps Other Supine Exercises: bridges 10 times  Other Supine Exercises: elbow flexion/extension x10 Seated Elevation: AROM;10 reps Extension: AROM;10 reps Row: AROM;10 reps Pulleys Flexion: 1 minute ABduction: 1 minute Therapy Ball Flexion: 15 reps ABduction: 15 reps ROM / Strengthening / Isometric Strengthening   Flexion: Supine;3X3" Extension: Supine;3X3" External Rotation: Supine;3X3" Internal Rotation: Supine;3X3" ABduction: Supine;3X3" ADduction: Supine;3X3"       Manual Therapy Manual Therapy: Myofascial release Myofascial Release: Myofascial release and manual stretching to right upper arm, shoulder and scapular region to decrease pain and fascial restrictions and increase pain free mobility.  Occupational Therapy Assessment and Plan OT Assessment and Plan Clinical Impression Statement: A:  Patient with tender spot in bicep area causing increase pain and tender to touch.  MFR decreased the tendersness.  Added pulleys and elbow AROM and increaed ball stretches, patient was able to complete ball stretches with larger blue ball. Rehab Potential:  Good OT Plan: P:  Add grip strengthening.   Goals Short Term Goals Time to Complete Short Term Goals: 6 weeks Short Term Goal 1: Patient will be educated on a HEP. Short Term Goal 2: Patient will increase PROM to [facility] for increased independence with donning and doffing jackets. Short Term Goal 3: Patient will increase her right shoulder strength to 3+/5 for increased ability to lift laundry baskets. Short Term Goal 4: Patient will decrease the pain level in her right shoulder to 3/10 with activity. Short Term Goal 5: Patient will decrease fascial restrictions from mod-max to moderate in her right shoulder region for greater mobility with B/IADLs.  Long Term Goals Time to Complete Long Term Goals: 12 weeks Long Term Goal 1: Patient will return to prior level of independence with all desired activities and be able to participate in canning and play golf. Long Term Goal 2: Patient will increase right shoulder AROM to [facility] for increased independence with playing golf. Long Term Goal 3: Patient will increase her right shoulder strength to 4+/5 for increased ability to lift materials when canning.  Long Term Goal 4: Patient will decrease the pain level in her right shoulder to 1/10 with activity. Long Term Goal 5: Patient will decrease fascial restrictions from  moderate  to minimal in her right shoulder region for greater mobility with B/IADLs.   Problem List Patient Active Problem List  Diagnosis  . Shoulder arthritis  . H/O total shoulder replacement  . Pain in joint, shoulder region  . Muscle weakness (generalized)    End of Session Activity Tolerance: Patient tolerated treatment well General Behavior During Session: [facility] for tasks performed Cognition: [facility] for tasks performed  GO    Alexx Giambra L. Noralee Stain, COTA/L 03/06/2013, 3:47 PM

## 2013-03-08 ENCOUNTER — Ambulatory Visit (HOSPITAL_COMMUNITY)
Admission: RE | Admit: 2013-03-08 | Discharge: 2013-03-08 | Disposition: A | Discharge status: Home / Self Care | Payer: Medicare Other | Source: Ambulatory Visit | Attending: Orthopedic Surgery | Admitting: Orthopedic Surgery

## 2013-03-08 DIAGNOSIS — M6281 Muscle weakness (generalized): Secondary | ICD-10-CM

## 2013-03-08 DIAGNOSIS — M25511 Pain in right shoulder: Secondary | ICD-10-CM

## 2013-03-08 DIAGNOSIS — Z96611 Presence of right artificial shoulder joint: Secondary | ICD-10-CM

## 2013-03-08 NOTE — Progress Notes (Signed)
Occupational Therapy Treatment Patient Details  Name: Angelica Wood MRN: [id number] Date of Birth: 1936/07/14  Today's Date: 03/08/2013 Time: 1520-1610 OT Time Calculation (min): 50 min MFR 1520-1540 10' Therex 7829-5621 40'  Visit#: 4 of 24  Re-eval: 03/27/13    Authorization: [employer]  Authorization Time Period: before 10th visit  Authorization Visit#: 4 of 10  Subjective Symptoms/Limitations Symptoms: S: Every time you move my arm it hurts so bad. Pain Assessment Pain Score:   2 Pain Location: Shoulder Pain Orientation: Right Pain Type: Surgical pain  Precautions/Restrictions  Precautions Precautions: Shoulder Type of Shoulder Precautions: PROM through 03/08/13 with limitations to 90 for flexion and 30 for external and internal rotation  Exercise/Treatments Supine Protraction: PROM;10 reps Horizontal ABduction: PROM;10 reps External Rotation: PROM;10 reps Internal Rotation: PROM;10 reps Flexion: PROM;10 reps ABduction: PROM;10 reps Other Supine Exercises: bridges 10 times  Other Supine Exercises: elbow flexion/extension x10 Seated Elevation: AROM;10 reps Extension: AROM;10 reps Row: AROM;10 reps Therapy Ball Flexion: 15 reps ABduction: 15 reps ROM / Strengthening / Isometric Strengthening   Flexion: Supine;3X3" Extension: Supine;3X3" External Rotation: Supine;3X3" Internal Rotation: Supine;3X3" ABduction: Supine;3X3" ADduction: Supine;3X3"    Manual Therapy Manual Therapy: Myofascial release Myofascial Release: Myofascial release and manual stretching to right upper arm, shoulder and scapular region to decrease pain and fascial restrictions and increase pain free mobility.  Occupational Therapy Assessment and Plan OT Assessment and Plan Clinical Impression Statement: A: Patient still with increased pain with PROM. Tenderness and tighness in bicep region. Applied ice at end of tx session. Rehab Potential: Good OT Plan: P:  Add grip  strengthening.   Goals Short Term Goals Time to Complete Short Term Goals: 6 weeks Short Term Goal 1: Patient will be educated on a HEP. Short Term Goal 1 Progress: Progressing toward goal Short Term Goal 2: Patient will increase PROM to [facility] for increased independence with donning and doffing jackets. Short Term Goal 2 Progress: Progressing toward goal Short Term Goal 3: Patient will increase her right shoulder strength to 3+/5 for increased ability to lift laundry baskets. Short Term Goal 3 Progress: Progressing toward goal Short Term Goal 4: Patient will decrease the pain level in her right shoulder to 3/10 with activity. Short Term Goal 4 Progress: Progressing toward goal Short Term Goal 5: Patient will decrease fascial restrictions from mod-max to moderate in her right shoulder region for greater mobility with B/IADLs.  Short Term Goal 5 Progress: Progressing toward goal Long Term Goals Time to Complete Long Term Goals: 12 weeks Long Term Goal 1: Patient will return to prior level of independence with all desired activities and be able to participate in canning and play golf. Long Term Goal 1 Progress: Progressing toward goal Long Term Goal 2: Patient will increase right shoulder AROM to [facility] for increased independence with playing golf. Long Term Goal 2 Progress: Progressing toward goal Long Term Goal 3: Patient will increase her right shoulder strength to 4+/5 for increased ability to lift materials when canning.  Long Term Goal 3 Progress: Progressing toward goal Long Term Goal 4: Patient will decrease the pain level in her right shoulder to 1/10 with activity. Long Term Goal 4 Progress: Progressing toward goal Long Term Goal 5: Patient will decrease fascial restrictions from  moderate  to minimal in her right shoulder region for greater mobility with B/IADLs.  Long Term Goal 5 Progress: Progressing toward goal  Problem List Patient Active Problem List  Diagnosis  . Shoulder  arthritis  . H/O total  shoulder replacement  . Pain in joint, shoulder region  . Muscle weakness (generalized)    End of Session Activity Tolerance: Patient tolerated treatment well General Behavior During Session: [facility] for tasks performed Cognition: [facility] for tasks performed   Limmie Patricia, OTR/L  03/08/2013, 4:15 PM

## 2013-03-13 ENCOUNTER — Ambulatory Visit (HOSPITAL_COMMUNITY)
Admission: RE | Admit: 2013-03-13 | Discharge: 2013-03-13 | Disposition: A | Discharge status: Home / Self Care | Payer: Medicare Other | Source: Ambulatory Visit | Attending: Orthopedic Surgery | Admitting: Orthopedic Surgery

## 2013-03-13 DIAGNOSIS — M25619 Stiffness of unspecified shoulder, not elsewhere classified: Secondary | ICD-10-CM | POA: Insufficient documentation

## 2013-03-13 DIAGNOSIS — Z96611 Presence of right artificial shoulder joint: Secondary | ICD-10-CM

## 2013-03-13 DIAGNOSIS — M25511 Pain in right shoulder: Secondary | ICD-10-CM

## 2013-03-13 DIAGNOSIS — IMO0001 Reserved for inherently not codable concepts without codable children: Secondary | ICD-10-CM | POA: Insufficient documentation

## 2013-03-13 DIAGNOSIS — M6281 Muscle weakness (generalized): Secondary | ICD-10-CM

## 2013-03-13 DIAGNOSIS — M25519 Pain in unspecified shoulder: Secondary | ICD-10-CM | POA: Insufficient documentation

## 2013-03-13 NOTE — Progress Notes (Signed)
Occupational Therapy Treatment Patient Details  Name: Angelica Wood MRN: [id number] Date of Birth: [birth year]  Today's Date: 03/13/2013 Time: 7425-9563 OT Time Calculation (min): 36 min Manual Therapy 234-258 24 Therapeutic Exercise 259-310 11'  Visit#: 5 of 24  Re-eval: 03/27/13    Authorization: [employer]  Authorization Time Period: before 10th visit  Authorization Visit#: 5 of 10  Subjective Symptoms/Limitations Symptoms: S:  My shoulder is swollen, the doctor said some of my stitches may be infected. Limitations: Protocol scanned into notes section of this chart.  PROM only through 3/27, limiting flexion to 90, external and internal rotation to 30, elbow - hand AROM, isometrics, pulleys.  Precautions/Restrictions     Exercise/Treatments Supine Protraction: PROM;10 reps Horizontal ABduction: PROM;10 reps External Rotation: PROM;10 reps Internal Rotation: PROM;10 reps Flexion: PROM;10 reps ABduction: PROM;10 reps Other Supine Exercises: bridges 10 times  Seated Elevation: AROM;10 reps Extension: AROM;10 reps Row: AROM;10 reps Therapy Ball Flexion: 15 reps ABduction: 15 reps ROM / Strengthening / Isometric Strengthening   Flexion: Supine;3X3" Extension: Supine;3X3" External Rotation: Supine;3X3" Internal Rotation: Supine;3X3" ABduction: Supine;3X3" ADduction: Supine;3X3"         Manual Therapy Manual Therapy: Myofascial release Myofascial Release: Myofascial release and manual stretching within limits of protocol to right upper arm, shoulder and scapular region to decrease pain and fascial restrictions and increase pain free mobility.  Occupational Therapy Assessment and Plan OT Assessment and Plan Clinical Impression Statement: A:  Decreased edema today but still some tenderness and pain in bicep area..  Kept exercises the same within the pain tolerance.  Will continue to progress slowly as patient tolerates. OT Plan: P:  Progress slowly per  protocol as patient tolerates.   Goals Short Term Goals Time to Complete Short Term Goals: 6 weeks Short Term Goal 1: Patient will be educated on a HEP. Short Term Goal 2: Patient will increase PROM to [facility] for increased independence with donning and doffing jackets. Short Term Goal 3: Patient will increase her right shoulder strength to 3+/5 for increased ability to lift laundry baskets. Short Term Goal 4: Patient will decrease the pain level in her right shoulder to 3/10 with activity. Short Term Goal 5: Patient will decrease fascial restrictions from mod-max to moderate in her right shoulder region for greater mobility with B/IADLs.  Long Term Goals Time to Complete Long Term Goals: 12 weeks Long Term Goal 1: Patient will return to prior level of independence with all desired activities and be able to participate in canning and play golf. Long Term Goal 2: Patient will increase right shoulder AROM to [facility] for increased independence with playing golf. Long Term Goal 3: Patient will increase her right shoulder strength to 4+/5 for increased ability to lift materials when canning.  Long Term Goal 4: Patient will decrease the pain level in her right shoulder to 1/10 with activity. Long Term Goal 5: Patient will decrease fascial restrictions from  moderate  to minimal in her right shoulder region for greater mobility with B/IADLs.   Problem List Patient Active Problem List  Diagnosis  . Shoulder arthritis  . H/O total shoulder replacement  . Pain in joint, shoulder region  . Muscle weakness (generalized)    End of Session Activity Tolerance: Patient tolerated treatment well General Behavior During Session: [facility] for tasks performed Cognition: [facility] for tasks performed  GO   Levante Simones L. Naziya Hegwood, COTA/L 03/13/2013, 3:30 PM

## 2013-03-15 ENCOUNTER — Ambulatory Visit (HOSPITAL_COMMUNITY)
Admission: RE | Admit: 2013-03-15 | Discharge: 2013-03-15 | Disposition: A | Discharge status: Home / Self Care | Payer: Medicare Other | Source: Ambulatory Visit | Attending: Orthopedic Surgery | Admitting: Orthopedic Surgery

## 2013-03-15 DIAGNOSIS — M6281 Muscle weakness (generalized): Secondary | ICD-10-CM

## 2013-03-15 DIAGNOSIS — Z96611 Presence of right artificial shoulder joint: Secondary | ICD-10-CM

## 2013-03-15 DIAGNOSIS — M25511 Pain in right shoulder: Secondary | ICD-10-CM

## 2013-03-15 NOTE — Progress Notes (Signed)
Occupational Therapy Treatment Patient Details  Name: Angelica Wood MRN: [id number] Date of Birth: [birth year]  Today's Date: 03/15/2013 Time: 4098-1191 OT Time Calculation (min): 43 min MFR 4782-9562 10' Therex 1308-6578 33'  Visit#: 6 of 24  Re-eval: 03/27/13    Authorization: [employer]  Authorization Time Period: before 10th visit  Authorization Visit#: 6 of 10  Subjective Symptoms/Limitations Symptoms: S: As long as I don't move my shoulder it doesn't hurt. Pain Assessment Currently in Pain?: No/denies  Precautions/Restrictions  Precautions Precautions: Shoulder Type of Shoulder Precautions: PROM through 03/08/13 with limitations to 90 for flexion and 30 for external and internal rotation  Exercise/Treatments Supine Protraction: PROM;10 reps Horizontal ABduction: PROM;10 reps External Rotation: PROM;10 reps Internal Rotation: PROM;10 reps Flexion: PROM;10 reps ABduction: PROM;10 reps Other Supine Exercises: bridges 10 times  Seated Elevation: AROM;10 reps Extension: AROM;10 reps Row: AROM;10 reps Therapy Ball Flexion: 15 reps ABduction: 15 reps ROM / Strengthening / Isometric Strengthening   Flexion: Supine;3X3" Extension: Supine;3X3" External Rotation: Supine;3X3" Internal Rotation: Supine;3X3" ABduction: Supine;3X3" ADduction: Supine;3X3"    Manual Therapy Manual Therapy: Myofascial release Myofascial Release: Myofascial release and manual stretching within limits of protocol to right upper arm, shoulder and scapular region to decrease pain and fascial restrictions and increase pain free mobility.  Occupational Therapy Assessment and Plan OT Assessment and Plan Clinical Impression Statement: A: Tenderness still present in bicep area. Able to stretch farther during PROM supine.  OT Plan: P:  Progress slowly per protocol as patient tolerates.   Goals Short Term Goals Time to Complete Short Term Goals: 6 weeks Short Term Goal 1: Patient will be  educated on a HEP. Short Term Goal 1 Progress: Progressing toward goal Short Term Goal 2: Patient will increase PROM to [facility] for increased independence with donning and doffing jackets. Short Term Goal 2 Progress: Progressing toward goal Short Term Goal 3: Patient will increase her right shoulder strength to 3+/5 for increased ability to lift laundry baskets. Short Term Goal 3 Progress: Progressing toward goal Short Term Goal 4: Patient will decrease the pain level in her right shoulder to 3/10 with activity. Short Term Goal 4 Progress: Progressing toward goal Short Term Goal 5: Patient will decrease fascial restrictions from mod-max to moderate in her right shoulder region for greater mobility with B/IADLs.  Short Term Goal 5 Progress: Progressing toward goal Long Term Goals Time to Complete Long Term Goals: 12 weeks Long Term Goal 1: Patient will return to prior level of independence with all desired activities and be able to participate in canning and play golf. Long Term Goal 1 Progress: Progressing toward goal Long Term Goal 2: Patient will increase right shoulder AROM to [facility] for increased independence with playing golf. Long Term Goal 2 Progress: Progressing toward goal Long Term Goal 3: Patient will increase her right shoulder strength to 4+/5 for increased ability to lift materials when canning.  Long Term Goal 3 Progress: Progressing toward goal Long Term Goal 4: Patient will decrease the pain level in her right shoulder to 1/10 with activity. Long Term Goal 4 Progress: Progressing toward goal Long Term Goal 5: Patient will decrease fascial restrictions from  moderate  to minimal in her right shoulder region for greater mobility with B/IADLs.  Long Term Goal 5 Progress: Progressing toward goal  Problem List Patient Active Problem List  Diagnosis  . Shoulder arthritis  . H/O total shoulder replacement  . Pain in joint, shoulder region  . Muscle weakness (generalized)    End  of  Session Activity Tolerance: Patient tolerated treatment well General Behavior During Session: [facility] for tasks performed Cognition: [facility] for tasks performed   Limmie Patricia, OTR/L,CBIS   03/15/2013, 3:17 PM

## 2013-03-20 ENCOUNTER — Ambulatory Visit (HOSPITAL_COMMUNITY)
Admission: RE | Admit: 2013-03-20 | Discharge: 2013-03-20 | Disposition: A | Discharge status: Home / Self Care | Payer: Medicare Other | Source: Ambulatory Visit | Attending: Orthopedic Surgery | Admitting: Orthopedic Surgery

## 2013-03-20 DIAGNOSIS — Z96611 Presence of right artificial shoulder joint: Secondary | ICD-10-CM

## 2013-03-20 DIAGNOSIS — M25511 Pain in right shoulder: Secondary | ICD-10-CM

## 2013-03-20 DIAGNOSIS — M6281 Muscle weakness (generalized): Secondary | ICD-10-CM

## 2013-03-20 NOTE — Progress Notes (Signed)
Occupational Therapy Treatment Patient Details  Name: LOVETTE MERTA MRN: [id number] Date of Birth: 1936/02/21  Today's Date: 03/20/2013 Time: 4098-1191 OT Time Calculation (min): 39 min Manual Therapy 229-252 23' Therapeutic Exercise 253-308 15'  Visit#: 7 of 24  Re-eval: 03/27/13    Authorization: [employer]  Authorization Time Period: before 10th visit  Authorization Visit#: 7 of 10  Subjective Symptoms/Limitations Symptoms: S:  I went to the doctor yesterday, he took those stitches out. Pain Assessment Currently in Pain?: No/denies Pain Score: 0-No pain  Precautions/Restrictions     Exercise/Treatments Supine Protraction: PROM;AAROM;10 reps Horizontal ABduction: PROM;AAROM;10 reps External Rotation: PROM;AAROM;10 reps Internal Rotation: PROM;AAROM;10 reps Flexion: PROM;AAROM;10 reps ABduction: PROM;AAROM;10 reps Other Supine Exercises: d/c Seated Elevation: AROM;10 reps Extension: AROM;10 reps Row: AROM;10 reps Therapy Ball Flexion: 15 reps ABduction: 15 reps ROM / Strengthening / Isometric Strengthening   Flexion: Other (comment) (d/c switched to AAROM)       Manual Therapy Manual Therapy: Myofascial release Myofascial Release: Myofascial release and manual stretching within limits of protocol to right upper arm, shoulder and scapular region to decrease pain and fascial restrictions and increase pain free mobility  Occupational Therapy Assessment and Plan OT Assessment and Plan Clinical Impression Statement: A; Switched from isometrics to AAROM in supine to progress according to protocol. Pt. wtih good form. Some slight pain with ABD so insturcted patient to go to the point of pain/stretch and not past the point of pain. OT Plan: P:  Add pulleys to increase AAROM   Goals Short Term Goals Time to Complete Short Term Goals: 6 weeks Short Term Goal 1: Patient will be educated on a HEP. Short Term Goal 2: Patient will increase PROM to [facility] for  increased independence with donning and doffing jackets. Short Term Goal 3: Patient will increase her right shoulder strength to 3+/5 for increased ability to lift laundry baskets. Short Term Goal 4: Patient will decrease the pain level in her right shoulder to 3/10 with activity. Short Term Goal 5: Patient will decrease fascial restrictions from mod-max to moderate in her right shoulder region for greater mobility with B/IADLs.  Long Term Goals Time to Complete Long Term Goals: 12 weeks Long Term Goal 1: Patient will return to prior level of independence with all desired activities and be able to participate in canning and play golf. Long Term Goal 2: Patient will increase right shoulder AROM to [facility] for increased independence with playing golf. Long Term Goal 3: Patient will increase her right shoulder strength to 4+/5 for increased ability to lift materials when canning.  Long Term Goal 4: Patient will decrease the pain level in her right shoulder to 1/10 with activity. Long Term Goal 5: Patient will decrease fascial restrictions from  moderate  to minimal in her right shoulder region for greater mobility with B/IADLs.   Problem List Patient Active Problem List  Diagnosis  . Shoulder arthritis  . H/O total shoulder replacement  . Pain in joint, shoulder region  . Muscle weakness (generalized)    End of Session Activity Tolerance: Patient tolerated treatment well General Behavior During Session: [facility] for tasks performed Cognition: [facility] for tasks performed  GO   Deloise Marchant L. [employer]ia [employer], COTA/L 03/20/2013, 3:17 PM

## 2013-03-22 ENCOUNTER — Ambulatory Visit (HOSPITAL_COMMUNITY): Payer: Medicare Other | Admitting: Specialist

## 2013-03-27 ENCOUNTER — Ambulatory Visit (HOSPITAL_COMMUNITY): Payer: Medicare Other | Admitting: Occupational Therapy

## 2013-03-28 ENCOUNTER — Inpatient Hospital Stay (HOSPITAL_COMMUNITY): Admission: RE | Admit: 2013-03-28 | Payer: Medicare Other | Source: Ambulatory Visit

## 2013-03-29 ENCOUNTER — Ambulatory Visit (HOSPITAL_COMMUNITY)
Admission: RE | Admit: 2013-03-29 | Discharge: 2013-03-29 | Disposition: A | Discharge status: Home / Self Care | Payer: Medicare Other | Source: Ambulatory Visit | Attending: Orthopedic Surgery | Admitting: Orthopedic Surgery

## 2013-03-29 DIAGNOSIS — M25511 Pain in right shoulder: Secondary | ICD-10-CM

## 2013-03-29 DIAGNOSIS — Z96611 Presence of right artificial shoulder joint: Secondary | ICD-10-CM

## 2013-03-29 DIAGNOSIS — M6281 Muscle weakness (generalized): Secondary | ICD-10-CM

## 2013-03-29 NOTE — Evaluation (Signed)
Occupational Therapy Re-Evaluation  Patient Details  Name: Angelica Wood MRN: [id number] Date of Birth: Jun 21, 1936  Today's Date: 03/29/2013 Time: 4098-1191 OT Time Calculation (min): 46 min MFR 4782-9562 22' Therex 1308-6578 14' Reassess 1510-1520 10'  Visit#: 8 of 24  Re-eval: 03/27/13     Authorization: [employer]  Authorization Time Period: before 20th visit  Authorization Visit#: 8 of 20   Past Medical History:  Past Medical History  Diagnosis Date  . Diabetes mellitus   . Chronic fatigue   . PONV (postoperative nausea and vomiting)   . Hypertension   . Thyroid disease   . Pneumonia     hx of  . Bronchitis     hx of  . Sleep apnea     uses cpap sparingly  . Urinary incontinence   . Irritable bowel syndrome (IBS)     hx of  . GERD (gastroesophageal reflux disease)   . Migraine     hx of  . Fibromyalgia   . Anemia     hx of  . Arthritis   . Shortness of breath     with exertion  . Hypothyroidism   . Vertigo     hx of  . Urgency of urination    Past Surgical History:  Past Surgical History  Procedure Laterality Date  . Appendectomy    . Abdominal hysterectomy    . Cholecystectomy    . Tonsillectomy    . Eye surgery      right cataract removed, laser surgery for glaucoma  . Dilation and curettage of uterus    . Total shoulder arthroplasty  06/29/2012    Procedure: TOTAL SHOULDER ARTHROPLASTY;  Surgeon: Senaida Lange, MD;  Location: [facility] OR;  Service: Orthopedics;  Laterality: Left;  left total shoulder arthroplasty  . Colonoscopy w/ polypectomy    . Total shoulder arthroplasty Right 02/08/2013    Procedure: TOTAL SHOULDER ARTHROPLASTY;  Surgeon: Senaida Lange, MD;  Location: [facility] OR;  Service: Orthopedics;  Laterality: Right;    Subjective Symptoms/Limitations Symptoms: S: I'm so sore today. Pain Assessment Currently in Pain?: No/denies Pain Score:   2 Pain Location: Shoulder Pain Orientation: Right Pain Type: Surgical  pain  Precautions/Restrictions  Precautions Precautions: Shoulder Type of Shoulder Precautions: PROM through 03/08/13 with limitations to 90 for flexion and 30 for external and internal rotation  Assessment  Additional Assessments RUE AROM (degrees) RUE Overall AROM Comments: assessed supine Right Shoulder Flexion: 120 Degrees Right Shoulder ABduction: 90 Degrees Right Shoulder Internal Rotation: 73 Degrees Right Shoulder External Rotation: 39 Degrees RUE PROM (degrees) RUE Overall PROM Comments: assessed in supine, external and internal rotation with shoulder adducted Right Shoulder Flexion: 125 Degrees (at eval: 95) Right Shoulder ABduction: 95 Degrees (at eval: 71) Right Shoulder Internal Rotation: 75 Degrees (at eval: 73) Right Shoulder External Rotation: 55 Degrees (at eval: 20) RUE Strength RUE Overall Strength Comments:  (was not assessed at eval ) Right Shoulder Flexion: 3-/5 Right Shoulder ABduction: 3-/5 Right Shoulder Internal Rotation: 3/5 Right Shoulder External Rotation: 3/5 Palpation Palpation:  (mod-min fascial restrictions in right upper arm, scapular, and shoulder region)     Exercise/Treatments Supine Protraction: PROM;10 reps Horizontal ABduction: PROM;10 reps External Rotation: PROM;10 reps Internal Rotation: PROM;10 reps Flexion: PROM;10 reps ABduction: PROM;10 reps      Manual Therapy Manual Therapy: Myofascial release Myofascial Release: Myofascial release and manual stretching within limits of protocol to right upper arm, shoulder and scapular region to decrease pain and fascial restrictions and  increase pain free mobility  Occupational Therapy Assessment and Plan OT Assessment and Plan Clinical Impression Statement: A: See MD note for progress.  OT Plan: P:  Add pulleys to increase AAROM   Goals Short Term Goals Time to Complete Short Term Goals: 6 weeks Short Term Goal 1: Patient will be educated on a HEP. Short Term Goal 1 Progress:  Met Short Term Goal 2: Patient will increase PROM to [facility] for increased independence with donning and doffing jackets. Short Term Goal 2 Progress: Met Short Term Goal 3: Patient will increase her right shoulder strength to 3+/5 for increased ability to lift laundry baskets. Short Term Goal 3 Progress: Progressing toward goal Short Term Goal 4: Patient will decrease the pain level in her right shoulder to 3/10 with activity. Short Term Goal 4 Progress: Met Short Term Goal 5: Patient will decrease fascial restrictions from mod-max to moderate in her right shoulder region for greater mobility with B/IADLs.  Short Term Goal 5 Progress: Met Long Term Goals Time to Complete Long Term Goals: 12 weeks Long Term Goal 1: Patient will return to prior level of independence with all desired activities and be able to participate in canning and play golf. Long Term Goal 1 Progress: Progressing toward goal Long Term Goal 2: Patient will increase right shoulder AROM to [facility] for increased independence with playing golf. Long Term Goal 2 Progress: Progressing toward goal Long Term Goal 3: Patient will increase her right shoulder strength to 4+/5 for increased ability to lift materials when canning.  Long Term Goal 3 Progress: Progressing toward goal Long Term Goal 4: Patient will decrease the pain level in her right shoulder to 1/10 with activity. Long Term Goal 4 Progress: Progressing toward goal Long Term Goal 5: Patient will decrease fascial restrictions from  moderate  to minimal in her right shoulder region for greater mobility with B/IADLs.  Long Term Goal 5 Progress: Progressing toward goal  Problem List Patient Active Problem List  Diagnosis  . Shoulder arthritis  . H/O total shoulder replacement  . Pain in joint, shoulder region  . Muscle weakness (generalized)    End of Session Activity Tolerance: Patient tolerated treatment well General Cognition: WFL for tasks performed  GO Functional  Assessment Tool Used: DASH score: 38.6 with ideal score being 0. At eval: 50 Functional Limitation: Carrying, moving and handling objects Carrying, Moving and Handling Objects Current Status [phone]): At least 20 percent but less than 40 percent impaired, limited or restricted Carrying, Moving and Handling Objects Goal Status [phone]): At least 1 percent but less than 20 percent impaired, limited or restricted  Limmie Patricia, OTR/L,CBIS   03/29/2013, 3:44 PM  Physician Documentation Your signature is required to indicate approval of the treatment plan as stated above.  Please sign and either send electronically or make a copy of this report for your files and return this physician signed original.  Please mark one 1.__approve of plan  2. ___approve of plan with the following conditions.   ______________________________                                                          _____________________ Physician Signature  Date  

## 2013-04-04 ENCOUNTER — Ambulatory Visit (HOSPITAL_COMMUNITY)
Admission: RE | Admit: 2013-04-04 | Discharge: 2013-04-04 | Disposition: A | Discharge status: Home / Self Care | Payer: Medicare Other | Source: Ambulatory Visit | Attending: Orthopedic Surgery | Admitting: Orthopedic Surgery

## 2013-04-04 DIAGNOSIS — M25511 Pain in right shoulder: Secondary | ICD-10-CM

## 2013-04-04 DIAGNOSIS — Z96611 Presence of right artificial shoulder joint: Secondary | ICD-10-CM

## 2013-04-04 DIAGNOSIS — M6281 Muscle weakness (generalized): Secondary | ICD-10-CM

## 2013-04-04 NOTE — Progress Notes (Signed)
Occupational Therapy Treatment Patient Details  Name: TYNEKA SCAFIDI MRN: [id number] Date of Birth: 1936-11-17  Today's Date: 04/04/2013 Time: 4696-2952 OT Time Calculation (min): 49 min Manual Therapy 8413-2440 20' Therapeutic Exercises [phone] 29' Visit#: 9 of 24  Re-eval: 04/26/13    Authorization: [employer]  Authorization Time Period: before 18th visit  Authorization Visit#: 9 of 18  Subjective  S:  I think I over did it.  I moved dishes from cabinet to cabinet and it is sore. Limitations: Protocol scanned into notes section of this chart.  Thru 04/19/13, AAROM in supine, flexion AROM in supine.  seated AROM flexion and abd 45-90 arc, pulleys, tband ER/IR, scapular stabilization Pain Assessment Currently in Pain?: Yes Pain Score:   2 Pain Location: Shoulder Pain Orientation: Right Pain Type: Acute pain  Precautions/Restrictions    Protocol scanned into notes section of this chart.  Thru 04/19/13, AAROM in supine, flexion AROM in supine.  seated AROM flexion and abd 45-90 arc, pulleys, tband ER/IR, scapular stabilization   Exercise/Treatments Supine Protraction: PROM;AAROM;10 reps Horizontal ABduction: PROM;AAROM;10 reps External Rotation: PROM;AAROM;10 reps Internal Rotation: PROM;AAROM;10 reps Flexion: PROM;AAROM;AROM;10 reps ABduction: PROM;AAROM;10 reps Seated Flexion: AROM;10 reps;Limitations Flexion Limitations: short arc 45-90 Abduction: AROM;10 reps;Limitations ABduction Limitations: short arc 45-90 Pulleys Flexion: 1 minute ABduction: 1 minute Therapy Ball Flexion: 20 reps ABduction: 20 reps ROM / Strengthening / Isometric Strengthening Thumb Tacks: 1 minute Proximal Shoulder Strengthening, Supine: 10 times each exercise without resting between exercises Prot/Ret//Elev/Dep: 1 min with min facilitation for technique  Rhythmic Stabilization, Supine: 30" with arm flexed to 90 and 30" with arm in netural to work on ER/IR        Manual  Therapy Manual Therapy: Myofascial release Myofascial Release: MFR and manual stretching to right upper arm, shoulder, scapular region to decrease pain and fascial restrictions and increase pain free mobility within parameters of protocol.  Scar release to surgical scar.  Occupational Therapy Assessment and Plan OT Assessment and Plan Clinical Impression Statement: A:  Added AAROM in supien, short arc flexion and abduction AROM in seated, per protocol.  Added proximal shoulder strengthening exercises in supine.   OT Plan: P:  Increase to 2 min with pulleys, add tband for ext, row, external rotation and internal rotation.    Goals Short Term Goals Time to Complete Short Term Goals: 6 weeks Short Term Goal 1: Patient will be educated on a HEP. Short Term Goal 2: Patient will increase PROM to [facility] for increased independence with donning and doffing jackets. Short Term Goal 3: Patient will increase her right shoulder strength to 3+/5 for increased ability to lift laundry baskets. Short Term Goal 3 Progress: Progressing toward goal Short Term Goal 4: Patient will decrease the pain level in her right shoulder to 3/10 with activity. Short Term Goal 5: Patient will decrease fascial restrictions from mod-max to moderate in her right shoulder region for greater mobility with B/IADLs.  Long Term Goals Time to Complete Long Term Goals: 12 weeks Long Term Goal 1: Patient will return to prior level of independence with all desired activities and be able to participate in canning and play golf. Long Term Goal 1 Progress: Progressing toward goal Long Term Goal 2: Patient will increase right shoulder AROM to [facility] for increased independence with playing golf. Long Term Goal 2 Progress: Progressing toward goal Long Term Goal 3: Patient will increase her right shoulder strength to 4+/5 for increased ability to lift materials when canning.  Long Term Goal 3 Progress:  Progressing toward goal Long Term Goal 4:  Patient will decrease the pain level in her right shoulder to 1/10 with activity. Long Term Goal 4 Progress: Progressing toward goal Long Term Goal 5: Patient will decrease fascial restrictions from  moderate  to minimal in her right shoulder region for greater mobility with B/IADLs.  Long Term Goal 5 Progress: Progressing toward goal  Problem List Patient Active Problem List  Diagnosis  . Shoulder arthritis  . H/O total shoulder replacement  . Pain in joint, shoulder region  . Muscle weakness (generalized)    End of Session Activity Tolerance: Patient tolerated treatment well General Behavior During Therapy: WFL for tasks assessed/performed Cognition: WFL for tasks performed  GO    Shirlean Mylar, OTR/L  04/04/2013, 2:52 PM

## 2013-04-06 ENCOUNTER — Ambulatory Visit (HOSPITAL_COMMUNITY)
Admission: RE | Admit: 2013-04-06 | Discharge: 2013-04-06 | Disposition: A | Discharge status: Home / Self Care | Payer: Medicare Other | Source: Ambulatory Visit | Attending: Orthopedic Surgery | Admitting: Orthopedic Surgery

## 2013-04-06 DIAGNOSIS — Z96611 Presence of right artificial shoulder joint: Secondary | ICD-10-CM

## 2013-04-06 DIAGNOSIS — M6281 Muscle weakness (generalized): Secondary | ICD-10-CM

## 2013-04-06 DIAGNOSIS — M25511 Pain in right shoulder: Secondary | ICD-10-CM

## 2013-04-06 NOTE — Progress Notes (Signed)
Occupational Therapy Treatment Patient Details  Name: Angelica Wood MRN: [id number] Date of Birth: 1936-03-10  Today's Date: 04/06/2013 Time: 1110-1200 OT Time Calculation (min): 50 min Manual Therapy 7829-5621 20' Therapeutic Exercises [phone] (no charge) and 6962-9528 23' Visit#: 10 of 24  Re-eval: 04/26/13    Authorization: [employer]  Authorization Time Period: before 18th visit  Authorization Visit#: 10 of 18  Subjective Symptoms/Limitations Symptoms: S:  I did the ball circles today! Limitations: Protocol scanned into notes section of this chart.  Thru 04/19/13, AAROM in supine, flexion AROM in supine.  seated AROM flexion and abd 45-90 arc, pulleys, tband ER/IR, scapular stabilization Pain Assessment Currently in Pain?: Yes Pain Score:   1 Pain Location: Shoulder Pain Type: Acute pain  Precautions/Restrictions    Protocol scanned into notes section of this chart.  Thru 04/19/13, AAROM in supine, flexion AROM in supine.  seated AROM flexion and abd 45-90 arc, pulleys, tband ER/IR, scapular stabilization   Exercise/Treatments Supine Protraction: PROM;AAROM;10 reps Horizontal ABduction: PROM;AAROM;10 reps External Rotation: PROM;AAROM;10 reps Internal Rotation: PROM;AAROM;10 reps Flexion: PROM;AAROM;AROM;10 reps ABduction: PROM;AAROM;10 reps Seated Flexion: AROM;10 reps;Limitations Flexion Limitations: short arc 45-90 Abduction: AROM;10 reps;Limitations ABduction Limitations: short arc 45-90 Pulleys Flexion: 2 minutes ABduction: 2 minutes Therapy Ball Flexion: 20 reps ABduction: 20 reps Right/Left: 5 reps ROM / Strengthening / Isometric Strengthening Thumb Tacks: 1 minute Proximal Shoulder Strengthening, Supine: 10 times each exercise without resting between exercises Prot/Ret//Elev/Dep: 1 min  Rhythmic Stabilization, Supine: 30" with arm flexed to 90 and 30" with arm in netural to work on ER/IR      Manual Therapy Manual Therapy: Myofascial  release Myofascial Release: MFR and manual stretching to right upper arm, shoulder, scapular region to decrease pain and fascial restrictions and increase pain free mobility within parameters of protocol. Scar release to surgical scar.  Occupational Therapy Assessment and Plan OT Assessment and Plan Clinical Impression Statement: A:  Increased mobility in RUE as evident by being able to complete ball circles that she could not complete at session earlier this week.  OT Plan: P:  Add tband for extension, row, external rotation and internal rotation.    Goals Short Term Goals Time to Complete Short Term Goals: 6 weeks Short Term Goal 1: Patient will be educated on a HEP. Short Term Goal 2: Patient will increase PROM to [facility] for increased independence with donning and doffing jackets. Short Term Goal 3: Patient will increase her right shoulder strength to 3+/5 for increased ability to lift laundry baskets. Short Term Goal 4: Patient will decrease the pain level in her right shoulder to 3/10 with activity. Short Term Goal 5: Patient will decrease fascial restrictions from mod-max to moderate in her right shoulder region for greater mobility with B/IADLs.  Long Term Goals Time to Complete Long Term Goals: 12 weeks Long Term Goal 1: Patient will return to prior level of independence with all desired activities and be able to participate in canning and play golf. Long Term Goal 2: Patient will increase right shoulder AROM to [facility] for increased independence with playing golf. Long Term Goal 3: Patient will increase her right shoulder strength to 4+/5 for increased ability to lift materials when canning.  Long Term Goal 4: Patient will decrease the pain level in her right shoulder to 1/10 with activity. Long Term Goal 5: Patient will decrease fascial restrictions from  moderate  to minimal in her right shoulder region for greater mobility with B/IADLs.   Problem List Patient Active Problem  List   Diagnosis  . Shoulder arthritis  . H/O total shoulder replacement  . Pain in joint, shoulder region  . Muscle weakness (generalized)    End of Session Activity Tolerance: Patient tolerated treatment well General Behavior During Therapy: WFL for tasks assessed/performed Cognition: WFL for tasks performed  GO    Shirlean Mylar, OTR/L  04/06/2013, 1:14 PM

## 2013-04-09 ENCOUNTER — Ambulatory Visit (HOSPITAL_COMMUNITY)
Admission: RE | Admit: 2013-04-09 | Discharge: 2013-04-09 | Disposition: A | Discharge status: Home / Self Care | Payer: Medicare Other | Source: Ambulatory Visit | Attending: Orthopedic Surgery | Admitting: Orthopedic Surgery

## 2013-04-09 DIAGNOSIS — M6281 Muscle weakness (generalized): Secondary | ICD-10-CM

## 2013-04-09 DIAGNOSIS — Z96611 Presence of right artificial shoulder joint: Secondary | ICD-10-CM

## 2013-04-09 DIAGNOSIS — M25511 Pain in right shoulder: Secondary | ICD-10-CM

## 2013-04-09 NOTE — Progress Notes (Signed)
Occupational Therapy Treatment Patient Details  Name: ATLEE VILLERS MRN: [id number] Date of Birth: Mar 09, 1936  Today's Date: 04/09/2013 Time: 4098-1191 OT Time Calculation (min): 38 min Manual Therapy 4782-9562 17' Therapeutic Exercises 1308-6578 21' Visit#: 11 of 24  Re-eval: 04/26/13    Authorization: [facility] Medicare  Authorization Time Period: before 18th visit  Authorization Visit#: 11 of 18  Subjective  S:  I was able to comb my hair today! Limitations: Protocol scanned into notes section of this chart.  Thru 04/19/13, AAROM in supine, flexion AROM in supine.  seated AROM flexion and abd 45-90 arc, pulleys, tband ER/IR, scapular stabilization Pain Assessment Currently in Pain?: No/denies  Precautions/Restrictions    Protocol scanned into notes section of this chart.  Thru 04/19/13, AAROM in supine, flexion AROM in supine.  seated AROM flexion and abd 45-90 arc, pulleys, tband ER/IR, scapular stabilization  Exercise/Treatments Supine Protraction: PROM;AAROM;10 reps Horizontal ABduction: PROM;AAROM;10 reps External Rotation: PROM;AAROM;10 reps Internal Rotation: PROM;AAROM;10 reps Flexion: PROM;AAROM;AROM;10 reps ABduction: PROM;AAROM;10 reps Seated Flexion: AROM;10 reps;Limitations Flexion Limitations: short arc 45-90 Abduction: AROM;10 reps;Limitations ABduction Limitations: short arc 45-90 Standing External Rotation: Theraband;10 reps Theraband Level (Shoulder External Rotation): Level 2 (Red) Internal Rotation: Theraband;10 reps Theraband Level (Shoulder Internal Rotation): Level 2 (Red) Extension: Theraband;10 reps Theraband Level (Shoulder Extension): Level 2 (Red) Row: Theraband;10 reps Theraband Level (Shoulder Row): Level 2 (Red) Pulleys Flexion:  (resume pulleys next visit) Therapy Ball Flexion: 25 reps ABduction: 25 reps Right/Left: 5 reps ROM / Strengthening / Isometric Strengthening Proximal Shoulder Strengthening, Supine: 10 times each exercise  without resting between exercises Prot/Ret//Elev/Dep: resume next visit Rhythmic Stabilization, Supine: 30" with arm flexed to 90 and 30" with arm in 90 abd to work on ER/IR      Manual Therapy Manual Therapy: Myofascial release Myofascial Release: MFR and manual stretching to right upper arm, shoulder, scapular region to decrease pain and fascial restrictions and increase pain free mobility within parameters of protocol. Scar release to surgical scar.  Occupational Therapy Assessment and Plan OT Assessment and Plan Clinical Impression Statement: A:  Increased PROM this date.  OT Plan: P:  Increase reps with AAROM in supine and attempt AAROM in seated.    Goals Short Term Goals Time to Complete Short Term Goals: 6 weeks Short Term Goal 1: Patient will be educated on a HEP. Short Term Goal 2: Patient will increase PROM to [facility] for increased independence with donning and doffing jackets. Short Term Goal 3: Patient will increase her right shoulder strength to 3+/5 for increased ability to lift laundry baskets. Short Term Goal 4: Patient will decrease the pain level in her right shoulder to 3/10 with activity. Short Term Goal 5: Patient will decrease fascial restrictions from mod-max to moderate in her right shoulder region for greater mobility with B/IADLs.  Long Term Goals Time to Complete Long Term Goals: 12 weeks Long Term Goal 1: Patient will return to prior level of independence with all desired activities and be able to participate in canning and play golf. Long Term Goal 1 Progress: Progressing toward goal Long Term Goal 2: Patient will increase right shoulder AROM to [facility] for increased independence with playing golf. Long Term Goal 2 Progress: Progressing toward goal Long Term Goal 3: Patient will increase her right shoulder strength to 4+/5 for increased ability to lift materials when canning.  Long Term Goal 3 Progress: Progressing toward goal Long Term Goal 4: Patient will  decrease the pain level in her right shoulder to 1/10 with  activity. Long Term Goal 4 Progress: Progressing toward goal Long Term Goal 5: Patient will decrease fascial restrictions from  moderate  to minimal in her right shoulder region for greater mobility with B/IADLs.  Long Term Goal 5 Progress: Progressing toward goal  Problem List Patient Active Problem List   Diagnosis Date Noted  . H/O total shoulder replacement 02/27/2013  . Pain in joint, shoulder region 02/27/2013  . Muscle weakness (generalized) 02/27/2013  . Shoulder arthritis 06/30/2012    End of Session Activity Tolerance: Patient tolerated treatment well General Behavior During Therapy: [facility] for tasks assessed/performed Cognition: WFL for tasks performed   Shirlean Mylar, OTR/L  04/09/2013, 12:15 PM

## 2013-04-10 ENCOUNTER — Ambulatory Visit (HOSPITAL_COMMUNITY): Payer: Medicare Other | Admitting: Specialist

## 2013-04-13 ENCOUNTER — Ambulatory Visit (HOSPITAL_COMMUNITY): Payer: Medicare Other

## 2013-04-16 ENCOUNTER — Ambulatory Visit (HOSPITAL_COMMUNITY)
Admission: RE | Admit: 2013-04-16 | Discharge: 2013-04-16 | Disposition: A | Discharge status: Home / Self Care | Payer: Medicare Other | Source: Ambulatory Visit | Attending: Orthopedic Surgery | Admitting: Orthopedic Surgery

## 2013-04-16 DIAGNOSIS — IMO0001 Reserved for inherently not codable concepts without codable children: Secondary | ICD-10-CM | POA: Insufficient documentation

## 2013-04-16 DIAGNOSIS — M25519 Pain in unspecified shoulder: Secondary | ICD-10-CM | POA: Insufficient documentation

## 2013-04-16 DIAGNOSIS — M25619 Stiffness of unspecified shoulder, not elsewhere classified: Secondary | ICD-10-CM | POA: Insufficient documentation

## 2013-04-16 DIAGNOSIS — M6281 Muscle weakness (generalized): Secondary | ICD-10-CM

## 2013-04-16 DIAGNOSIS — M25511 Pain in right shoulder: Secondary | ICD-10-CM

## 2013-04-16 DIAGNOSIS — Z96611 Presence of right artificial shoulder joint: Secondary | ICD-10-CM

## 2013-04-16 NOTE — Progress Notes (Signed)
Occupational Therapy Treatment Patient Details  Name: FONNIE CROOKSHANKS MRN: [id number] Date of Birth: Jul 23, 1936  Today's Date: 04/16/2013 Time: 6213-0865 OT Time Calculation (min): 51 min Manual Therapy [id number] 24' Therapeutic Exercises [id number] [phone] no charge) 19' Visit#: 12 of 24  Re-eval: 04/26/13    Authorization: [employer]  Authorization Time Period: before 18th visit  Authorization Visit#: 12 of 18  Subjective S:  My shoulder is hurting more today.  My blood sugar dropped way down this am, and then it got high.  Limitations: Protocol scanned into notes section of this chart.  Thru 04/19/13, AAROM in supine, flexion AROM in supine.  seated AROM flexion and abd 45-90 arc, pulleys, tband ER/IR, scapular stabilization Pain Assessment Currently in Pain?: Yes Pain Score:   2 Pain Location: Shoulder Pain Orientation: Right Pain Type: Acute pain  Precautions/Restrictions    Protocol scanned into notes section of this chart.  Thru 04/19/13, AAROM in supine, flexion AROM in supine.  seated AROM flexion and abd 45-90 arc, pulleys, tband ER/IR, scapular stabilization   Exercise/Treatments Supine Protraction: PROM;AAROM;12 reps Horizontal ABduction: PROM;AAROM;12 reps External Rotation: PROM;AAROM;12 reps Internal Rotation: PROM;10 reps;AAROM;12 reps Flexion: PROM;10 reps;AAROM;12 reps ABduction: PROM;10 reps;AAROM;12 reps Seated Protraction: AAROM;10 reps Horizontal ABduction: AAROM;10 reps External Rotation: AAROM;10 reps Internal Rotation: AAROM;10 reps Flexion: AAROM;AROM;10 reps Flexion Limitations: short arc 45-90 Abduction: AROM;AAROM;10 reps ABduction Limitations: short arc 45-90 Pulleys Flexion: 2 minutes ABduction: 2 minutes Therapy Ball Flexion: 25 reps ABduction: 25 reps Right/Left: 5 reps ROM / Strengthening / Isometric Strengthening Thumb Tacks: 1 minute Proximal Shoulder Strengthening, Supine: 10 times each exercise without resting between  exercises Prot/Ret//Elev/Dep: 1 min Rhythmic Stabilization, Supine: 30" with arm flexed to 90 and 30" with arm in 90 abd to work on ER/IR      Manual Therapy Manual Therapy: Myofascial release Myofascial Release: MFR and manual stretching to right upper arm, shoulder, scapular region to decrease pain and fascial restrictions and increase pain free mobility within parameters of protocol. Scar release to surgical scar.  Occupational Therapy Assessment and Plan OT Assessment and Plan Clinical Impression Statement: A:  Added AAROM in seated, with good mechanics and control of movement demonstrated. OT Plan: P:  Reassess for MD appointment this week.  Increase AAROM repetitions and advance exercises, per protocol, as pain limit and functional performance allow.     Goals Short Term Goals Time to Complete Short Term Goals: 6 weeks Short Term Goal 1: Patient will be educated on a HEP. Short Term Goal 2: Patient will increase PROM to [facility] for increased independence with donning and doffing jackets. Short Term Goal 3: Patient will increase her right shoulder strength to 3+/5 for increased ability to lift laundry baskets. Short Term Goal 4: Patient will decrease the pain level in her right shoulder to 3/10 with activity. Short Term Goal 5: Patient will decrease fascial restrictions from mod-max to moderate in her right shoulder region for greater mobility with B/IADLs.  Long Term Goals Time to Complete Long Term Goals: 12 weeks Long Term Goal 1: Patient will return to prior level of independence with all desired activities and be able to participate in canning and play golf. Long Term Goal 1 Progress: Progressing toward goal Long Term Goal 2: Patient will increase right shoulder AROM to [facility] for increased independence with playing golf. Long Term Goal 2 Progress: Progressing toward goal Long Term Goal 3: Patient will increase her right shoulder strength to 4+/5 for increased ability to lift materials  when canning.  Long Term Goal 3 Progress: Progressing toward goal Long Term Goal 4: Patient will decrease the pain level in her right shoulder to 1/10 with activity. Long Term Goal 5: Patient will decrease fascial restrictions from  moderate  to minimal in her right shoulder region for greater mobility with B/IADLs.  Long Term Goal 5 Progress: Progressing toward goal  Problem List Patient Active Problem List   Diagnosis Date Noted  . H/O total shoulder replacement 02/27/2013  . Pain in joint, shoulder region 02/27/2013  . Muscle weakness (generalized) 02/27/2013  . Shoulder arthritis 06/30/2012    End of Session Activity Tolerance: Patient tolerated treatment well General Behavior During Therapy: [facility] for tasks assessed/performed Cognition: WFL for tasks performed   Shirlean Mylar, OTR/L  04/16/2013, 3:18 PM

## 2013-04-17 ENCOUNTER — Ambulatory Visit (HOSPITAL_COMMUNITY)
Admission: RE | Admit: 2013-04-17 | Discharge: 2013-04-17 | Disposition: A | Discharge status: Home / Self Care | Payer: Medicare Other | Source: Ambulatory Visit | Attending: Orthopedic Surgery | Admitting: Orthopedic Surgery

## 2013-04-17 DIAGNOSIS — M25511 Pain in right shoulder: Secondary | ICD-10-CM

## 2013-04-17 DIAGNOSIS — M6281 Muscle weakness (generalized): Secondary | ICD-10-CM

## 2013-04-17 DIAGNOSIS — Z96611 Presence of right artificial shoulder joint: Secondary | ICD-10-CM

## 2013-04-17 NOTE — Progress Notes (Signed)
Occupational Therapy Treatment Patient Details  Name: Angelica Wood MRN: [id number] Date of Birth: Jun 14, 1936  Today's Date: 04/17/2013 Time: 7829-5621 OT Time Calculation (min): 49 min Manual therapy [id number] 25' Therapeutic Exercises [phone] 24' Visit#: 13 of 24  Re-eval: 04/26/13    Authorization: [employer]  Authorization Time Period: before 18th visit  Authorization Visit#: 13 of 18  Subjective S:  I know its moving so much more. Limitations: Protocol scanned into notes section of this chart.  Thru 04/19/13, AAROM in supine, flexion AROM in supine.  seated AROM flexion and abd 45-90 arc, pulleys, tband ER/IR, scapular stabilization Pain Assessment Currently in Pain?: Yes Pain Score:   1 Pain Location: Shoulder Pain Orientation: Right Pain Type: Acute pain  Precautions/Restrictions    Limitations: Protocol scanned into notes section of this chart.  Thru 04/19/13, AAROM in supine, flexion AROM in supine.  seated AROM flexion and abd 45-90 arc, pulleys, tband ER/IR, scapular stabilization Exercise/Treatments Supine Protraction: PROM;5 reps Horizontal ABduction: PROM;5 reps External Rotation: PROM;5 reps Internal Rotation: PROM;5 reps Flexion: PROM;5 reps ABduction: PROM;5 reps Seated Protraction: AAROM;12 reps Horizontal ABduction: AAROM;12 reps External Rotation: AAROM;12 reps Internal Rotation: AAROM;12 reps Flexion: AAROM;12 reps;AROM;10 reps Flexion Limitations: short arc 45-90 Abduction: AAROM;12 reps;AROM;10 reps ABduction Limitations: short arc 45-90 Standing External Rotation: Theraband;15 reps Theraband Level (Shoulder External Rotation): Level 2 (Red) Internal Rotation: Theraband;15 reps Theraband Level (Shoulder Internal Rotation): Level 2 (Red) Extension: Theraband;15 reps Theraband Level (Shoulder Extension): Level 2 (Red) Row: Theraband;15 reps Theraband Level (Shoulder Row): Level 2 (Red) Pulleys Flexion: 2 minutes ABduction: 2  minutes Therapy Ball Flexion: 25 reps ABduction: 25 reps Right/Left: 5 reps ROM / Strengthening / Isometric Strengthening Thumb Tacks: 1 minute Proximal Shoulder Strengthening, Supine: resume next visit Prot/Ret//Elev/Dep: 1 minute Rhythmic Stabilization, Supine: resume next visit      Manual Therapy Manual Therapy: Other (comment) Myofascial Release: MFR and manual stretching to right upper arm, shoulder, scapular region to decrease pain and fascial restrictions and increase pain free mobility within parameters of protocol. Scar release to surgical scar. Other Manual Therapy: Manual massage with Hawksgrip 8 and hawksgrip 9 for massage to trapezius and scapular region of left shoulder.   Occupational Therapy Assessment and Plan OT Assessment and Plan Clinical Impression Statement: A:  Added manual therapy with hawksgrips.  Patient felt left shoulder was more mobilie after this intervention.  OT Plan: P:  Reassess for MD appointment this week.  Increase AAROM repetitions and advance exercises, per protocol, as pain limit and functional performance allow.     Goals Short Term Goals Time to Complete Short Term Goals: 6 weeks Short Term Goal 1: Patient will be educated on a HEP. Short Term Goal 2: Patient will increase PROM to [facility] for increased independence with donning and doffing jackets. Short Term Goal 3: Patient will increase her right shoulder strength to 3+/5 for increased ability to lift laundry baskets. Short Term Goal 4: Patient will decrease the pain level in her right shoulder to 3/10 with activity. Short Term Goal 5: Patient will decrease fascial restrictions from mod-max to moderate in her right shoulder region for greater mobility with B/IADLs.  Long Term Goals Time to Complete Long Term Goals: 12 weeks Long Term Goal 1: Patient will return to prior level of independence with all desired activities and be able to participate in canning and play golf. Long Term Goal 2:  Patient will increase right shoulder AROM to [facility] for increased independence with playing golf. Long Term Goal  3: Patient will increase her right shoulder strength to 4+/5 for increased ability to lift materials when canning.  Long Term Goal 4: Patient will decrease the pain level in her right shoulder to 1/10 with activity. Long Term Goal 5: Patient will decrease fascial restrictions from  moderate  to minimal in her right shoulder region for greater mobility with B/IADLs.   Problem List Patient Active Problem List   Diagnosis Date Noted  . H/O total shoulder replacement 02/27/2013  . Pain in joint, shoulder region 02/27/2013  . Muscle weakness (generalized) 02/27/2013  . Shoulder arthritis 06/30/2012    End of Session Activity Tolerance: Patient tolerated treatment well General Behavior During Therapy: [facility] for tasks assessed/performed Cognition: WFL for tasks performed   Shirlean Mylar, OTR/L  04/17/2013, 3:26 PM

## 2013-04-19 ENCOUNTER — Ambulatory Visit (HOSPITAL_COMMUNITY)
Admission: RE | Admit: 2013-04-19 | Discharge: 2013-04-19 | Disposition: A | Discharge status: Home / Self Care | Payer: Medicare Other | Source: Ambulatory Visit | Attending: Orthopedic Surgery | Admitting: Orthopedic Surgery

## 2013-04-19 DIAGNOSIS — M6281 Muscle weakness (generalized): Secondary | ICD-10-CM

## 2013-04-19 DIAGNOSIS — Z96611 Presence of right artificial shoulder joint: Secondary | ICD-10-CM

## 2013-04-19 DIAGNOSIS — M25511 Pain in right shoulder: Secondary | ICD-10-CM

## 2013-04-19 NOTE — Progress Notes (Signed)
Occupational Therapy Treatment Patient Details  Name: Angelica Wood MRN: [id number] Date of Birth: [birth year]  Today's Date: 04/19/2013 Time: 7829-5621 OT Time Calculation (min): 44 min Manual Therapy 3086-5784 21' Reassessment 1455-1510 15' Therapeutic Exercises [phone] 8' Visit#: 14 of 24  Re-eval: 05/17/13    Authorization: [employer]  Authorization Time Period: before 24th visit  Authorization Visit#: 14 of 24  Subjective S:  I  go to the MD tomorrow. Limitations: Protocol scanned into notes section of this chart.  Thru 04/19/13, AAROM in supine, flexion AROM in supine.  seated AROM flexion and abd 45-90 arc, pulleys, tband ER/IR, scapular stabilization Special Tests: DASH:  previous score was 38, current score is 18 Pain Assessment Currently in Pain?: Yes Pain Score:   1 Pain Location: Shoulder Pain Orientation: Right Pain Type: Acute pain  Precautions/Restrictions    Protocol scanned into notes section of this chart.  Thru 04/19/13, AAROM in supine, flexion AROM in supine.  seated AROM flexion and abd 45-90 arc, pulleys, tband ER/IR, scapular stabilization Exercise/Treatments Supine Protraction: PROM;5 reps Horizontal ABduction: PROM;5 reps External Rotation: PROM;5 reps Internal Rotation: PROM;5 reps Flexion: PROM;5 reps ABduction: PROM;5 reps     Manual Therapy Manual Therapy: Myofascial release Myofascial Release: MFR and manual stretching to right upper arm, shoulder, scapular region to decrease pain and fascial restrictions and increase pain free mobility within parameters of protocol. Scar release to surgical scar.  Occupational Therapy Assessment and Plan OT Assessment and Plan Clinical Impression Statement: A:  ROM/Strength: (03/29/13 supine A/PROM)  supine A/PROM:  flexion 141/154 (120/125), abduction 170/170 (90/95), external rotation with shoulder adducted 39/66 (39/55), internal rotation with shoulder adducted 76 (73/75).  Seated AROM and strength:   flexion 122 4/5, abduction 120 4/5, external rotation with shoulder adducted 31 4/5, internal rotation with shoulder adducted 80 4/5. OT Frequency: Min 2X/week OT Duration: 4 weeks OT Plan: P:  Refer to Phase III of scanned protocol.  Begin AROM in supine and seated, add scapular strengthening and stability exercises.     Goals Short Term Goals Time to Complete Short Term Goals: 6 weeks Short Term Goal 1: Patient will be educated on a HEP. Short Term Goal 1 Progress: Met Short Term Goal 2: Patient will increase PROM to [facility] for increased independence with donning and doffing jackets. Short Term Goal 2 Progress: Met Short Term Goal 3: Patient will increase her right shoulder strength to 3+/5 for increased ability to lift laundry baskets. Short Term Goal 3 Progress: Met Short Term Goal 4: Patient will decrease the pain level in her right shoulder to 3/10 with activity. Short Term Goal 4 Progress: Met Short Term Goal 5: Patient will decrease fascial restrictions from mod-max to moderate in her right shoulder region for greater mobility with B/IADLs.  Short Term Goal 5 Progress: Met Long Term Goals Time to Complete Long Term Goals: 12 weeks Long Term Goal 1: Patient will return to prior level of independence with all desired activities and be able to participate in canning and play golf. Long Term Goal 1 Progress: Progressing toward goal Long Term Goal 2: Patient will increase right shoulder AROM to [facility] for increased independence with playing golf. Long Term Goal 2 Progress: Progressing toward goal Long Term Goal 3: Patient will increase her right shoulder strength to 4+/5 for increased ability to lift materials when canning.  Long Term Goal 3 Progress: Progressing toward goal Long Term Goal 4: Patient will decrease the pain level in her right shoulder to 1/10 with  activity. Long Term Goal 4 Progress: Met Long Term Goal 5: Patient will decrease fascial restrictions from  moderate  to minimal  in her right shoulder region for greater mobility with B/IADLs.  Long Term Goal 5 Progress: Met  Problem List Patient Active Problem List   Diagnosis Date Noted  . H/O total shoulder replacement 02/27/2013  . Pain in joint, shoulder region 02/27/2013  . Muscle weakness (generalized) 02/27/2013  . Shoulder arthritis 06/30/2012    End of Session Activity Tolerance: Patient tolerated treatment well General Behavior During Therapy: [facility] for tasks assessed/performed Cognition: WFL for tasks performed  GO Functional Assessment Tool Used: DASH score is 18, previous score was 38.8 Functional Limitation: Carrying, moving and handling objects Carrying, Moving and Handling Objects Goal Status ([id number]): At least 1 percent but less than 20 percent impaired, limited or restricted  Shirlean Mylar, OTR/L  04/19/2013, 3:53 PM

## 2013-04-24 ENCOUNTER — Inpatient Hospital Stay (HOSPITAL_COMMUNITY): Admission: RE | Admit: 2013-04-24 | Payer: Medicare Other | Source: Ambulatory Visit

## 2013-04-26 ENCOUNTER — Inpatient Hospital Stay (HOSPITAL_COMMUNITY): Admission: RE | Admit: 2013-04-26 | Payer: Medicare Other | Source: Ambulatory Visit

## 2013-04-26 ENCOUNTER — Telehealth (HOSPITAL_COMMUNITY): Payer: Self-pay

## 2013-04-26 NOTE — Telephone Encounter (Signed)
  Left a message regarding missed therapy appt this week on Tuesday. Pt had scheduled appt today 04/26/13 and has not arrived as of 11:57. Asked patient to call the office and confirm that she will be able to make her remaining appointments.  Limmie Patricia, OTR/L,CBIS

## 2013-04-30 ENCOUNTER — Telehealth (HOSPITAL_COMMUNITY): Payer: Self-pay

## 2013-05-01 ENCOUNTER — Ambulatory Visit (HOSPITAL_COMMUNITY): Payer: Medicare Other | Admitting: Specialist

## 2013-05-03 ENCOUNTER — Ambulatory Visit (HOSPITAL_COMMUNITY): Payer: Medicare Other

## 2013-05-08 ENCOUNTER — Ambulatory Visit (HOSPITAL_COMMUNITY): Payer: Medicare Other | Admitting: Specialist

## 2013-05-10 ENCOUNTER — Ambulatory Visit (HOSPITAL_COMMUNITY): Payer: Medicare Other

## 2013-06-12 DIAGNOSIS — K2100 Gastro-esophageal reflux disease with esophagitis, without bleeding: Secondary | ICD-10-CM | POA: Insufficient documentation

## 2013-10-09 DIAGNOSIS — K589 Irritable bowel syndrome without diarrhea: Secondary | ICD-10-CM | POA: Insufficient documentation

## 2014-01-09 DIAGNOSIS — Z23 Encounter for immunization: Secondary | ICD-10-CM | POA: Insufficient documentation

## 2014-02-21 DIAGNOSIS — L299 Pruritus, unspecified: Secondary | ICD-10-CM | POA: Insufficient documentation

## 2014-05-17 DIAGNOSIS — N183 Chronic kidney disease, stage 3 unspecified: Secondary | ICD-10-CM | POA: Insufficient documentation

## 2014-06-24 DIAGNOSIS — R252 Cramp and spasm: Secondary | ICD-10-CM | POA: Insufficient documentation

## 2015-01-23 DIAGNOSIS — Z9189 Other specified personal risk factors, not elsewhere classified: Secondary | ICD-10-CM | POA: Insufficient documentation

## 2015-02-25 DIAGNOSIS — R300 Dysuria: Secondary | ICD-10-CM | POA: Insufficient documentation

## 2015-04-20 DIAGNOSIS — R072 Precordial pain: Secondary | ICD-10-CM | POA: Insufficient documentation

## 2015-08-19 DIAGNOSIS — A09 Infectious gastroenteritis and colitis, unspecified: Secondary | ICD-10-CM | POA: Insufficient documentation

## 2015-09-07 DIAGNOSIS — M545 Low back pain, unspecified: Secondary | ICD-10-CM | POA: Insufficient documentation

## 2016-01-16 DIAGNOSIS — R490 Dysphonia: Secondary | ICD-10-CM | POA: Insufficient documentation

## 2016-01-16 DIAGNOSIS — J383 Other diseases of vocal cords: Secondary | ICD-10-CM | POA: Insufficient documentation

## 2016-02-02 ENCOUNTER — Telehealth (INDEPENDENT_AMBULATORY_CARE_PROVIDER_SITE_OTHER): Payer: Self-pay | Admitting: Internal Medicine

## 2016-02-02 NOTE — Telephone Encounter (Signed)
Ms. Bengtson left a message saying her PCP told her it's time for her to have a Colonoscopy. When I asked if she's having problems or issues she said she believes the Metformin she takes causes her to have uncontrollable diarrhea. She also mentioned taking Oxycodone for pain and she thinks that medication and the Metformin are working against each other. She mentioned sometimes she's unaware of when she's messed up her clothes with diarrhea and she also experiences stomach pain. Please call the pt regarding this.  Pt's ph# [phone] Thank you.

## 2016-02-02 NOTE — Telephone Encounter (Signed)
This will be discussed with Dr.Rehman on 02/03/2016.

## 2016-02-11 NOTE — Telephone Encounter (Signed)
Patient was called and message was left on her voicemail.  Per Dr.Rehman ask the patient how long has it been since she had Colonoscopy. If 10 years , we can precede with another one. Ask patient if she wants a OV or to move forward with procedure.

## 2016-02-26 NOTE — Telephone Encounter (Signed)
Ms. Hartz left a message saying she was returning the phone call that was made to her and she'd like another phone to be made to her.  Thank you.

## 2016-03-01 NOTE — Telephone Encounter (Signed)
Patient was called,another message was left. She was ask to call and let us know when her last Colonoscopy was done.If 10 years,we would precede with Colonoscopy. Or if the patient wanted to be seen we would make OV,otherwise move forward with Colonoscopy.

## 2016-05-11 DIAGNOSIS — E875 Hyperkalemia: Secondary | ICD-10-CM | POA: Insufficient documentation

## 2016-06-03 DIAGNOSIS — J209 Acute bronchitis, unspecified: Secondary | ICD-10-CM | POA: Insufficient documentation

## 2016-06-03 DIAGNOSIS — R509 Fever, unspecified: Secondary | ICD-10-CM | POA: Insufficient documentation

## 2017-08-18 DIAGNOSIS — G6289 Other specified polyneuropathies: Secondary | ICD-10-CM | POA: Insufficient documentation

## 2019-01-19 DIAGNOSIS — M25551 Pain in right hip: Secondary | ICD-10-CM | POA: Insufficient documentation

## 2019-04-19 DIAGNOSIS — E11628 Type 2 diabetes mellitus with other skin complications: Secondary | ICD-10-CM | POA: Insufficient documentation

## 2019-04-19 DIAGNOSIS — L089 Local infection of the skin and subcutaneous tissue, unspecified: Secondary | ICD-10-CM | POA: Insufficient documentation

## 2019-04-19 DIAGNOSIS — L509 Urticaria, unspecified: Secondary | ICD-10-CM | POA: Insufficient documentation

## 2019-04-19 DIAGNOSIS — L03031 Cellulitis of right toe: Secondary | ICD-10-CM | POA: Insufficient documentation

## 2019-04-25 ENCOUNTER — Inpatient Hospital Stay: Admit: 2019-04-25 | Payer: BC Managed Care – PPO | Admitting: Orthopedic Surgery

## 2019-04-25 SURGERY — ARTHROPLASTY, HIP, TOTAL, ANTERIOR APPROACH
Anesthesia: Choice | Laterality: Right

## 2019-05-09 NOTE — H&P (Addendum)
TOTAL HIP ADMISSION H&P  Patient is admitted for right total hip arthroplasty.  Subjective:  Chief Complaint: right hip pain  HPI: Angelica Wood, 83 y.o. female, has a history of pain and functional disability in the right hip(s) due to arthritis and patient has failed non-surgical conservative treatments for greater than 12 weeks to include NSAID's and/or analgesics and activity modification.  Onset of symptoms was gradual starting 1 years ago with gradually worsening course since that time.The patient noted no past surgery on the right hip(s).  Patient currently rates pain in the right hip at 10 out of 10 with activity. Patient has worsening of pain with activity and weight bearing, pain that interfers with activities of daily living and crepitus. Patient has evidence of severe bone-on-bone osteoarthritis of the right hip with large subchondral cysts and large osteophytes, she has massive osteophytes within the lateral aspect by imaging studies. This condition presents safety issues increasing the risk of falls. There is no current active infection.  Patient Active Problem List   Diagnosis Date Noted  . H/O total shoulder replacement 02/27/2013  . Pain in joint, shoulder region 02/27/2013  . Muscle weakness (generalized) 02/27/2013  . Shoulder arthritis 06/30/2012   Past Medical History:  Diagnosis Date  . Anemia    hx of  . Arthritis   . Bronchitis    hx of  . Chronic fatigue   . Diabetes mellitus   . Fibromyalgia   . GERD (gastroesophageal reflux disease)   . Hypertension   . Hypothyroidism   . Irritable bowel syndrome (IBS)    hx of  . Migraine    hx of  . Pneumonia    hx of  . PONV (postoperative nausea and vomiting)   . Shortness of breath    with exertion  . Sleep apnea    uses cpap sparingly  . Thyroid disease   . Urgency of urination   . Urinary incontinence   . Vertigo    hx of    Past Surgical History:  Procedure Laterality Date  . ABDOMINAL HYSTERECTOMY     . APPENDECTOMY    . CHOLECYSTECTOMY    . COLONOSCOPY W/ POLYPECTOMY    . DILATION AND CURETTAGE OF UTERUS    . EYE SURGERY     right cataract removed, laser surgery for glaucoma  . TONSILLECTOMY    . TOTAL SHOULDER ARTHROPLASTY  06/29/2012   Procedure: TOTAL SHOULDER ARTHROPLASTY;  Surgeon: Marin Shutter, MD;  Location: [facility];  Service: Orthopedics;  Laterality: Left;  left total shoulder arthroplasty  . TOTAL SHOULDER ARTHROPLASTY Right 02/08/2013   Procedure: TOTAL SHOULDER ARTHROPLASTY;  Surgeon: Marin Shutter, MD;  Location: [facility];  Service: Orthopedics;  Laterality: Right;    No current facility-administered medications for this encounter.    Current Outpatient Medications  Medication Sig Dispense Refill Last Dose  . albuterol (PROVENTIL HFA;VENTOLIN HFA) 108 (90 BASE) MCG/ACT inhaler Inhale 2 puffs into the lungs every 6 (six) hours as needed for wheezing.   02/08/2013 at Unknown  . cetirizine (ZYRTEC) 10 MG tablet Take 10 mg by mouth daily.   02/07/2013 at Unknown  . DULoxetine (CYMBALTA) 60 MG capsule Take 60 mg by mouth 2 (two) times daily.   02/07/2013 at Unknown  . insulin glargine (LANTUS) 100 UNIT/ML injection Inject 28 Units into the skin at bedtime.   02/07/2013 at Unknown  . levothyroxine (SYNTHROID, LEVOTHROID) 100 MCG tablet Take 100 mcg by mouth daily.   02/08/2013 at  Unknown  . losartan-hydrochlorothiazide (HYZAAR) 100-12.5 MG per tablet Take 1 tablet by mouth daily.   02/08/2013 at Unknown  . metoprolol (LOPRESSOR) 50 MG tablet Take 50 mg by mouth 2 (two) times daily.   02/08/2013 at 0230  . OVER THE COUNTER MEDICATION Take 1 tablet by mouth 3 (three) times daily. Zyflamend tablets antiinflammatory   02/07/2013 at Unknown  . oxyCODONE-acetaminophen (PERCOCET) 5-325 MG per tablet Take 1 tablet by mouth 4 (four) times daily as needed. For pain   02/07/2013 at Unknown  . oxyCODONE-acetaminophen (PERCOCET/ROXICET) 5-325 MG per tablet Take 1-2 tablets by mouth every 4 (four) hours  as needed. 80 tablet 0   . RABEprazole (ACIPHEX) 20 MG tablet Take 20 mg by mouth 2 (two) times daily.    02/08/2013 at Unknown  . tiZANidine (ZANAFLEX) 4 MG capsule Take 1 capsule (4 mg total) by mouth 3 (three) times daily as needed for muscle spasms. 30 capsule 1    Allergies  Allergen Reactions  . Glucophage [Metformin Hydrochloride] Diarrhea  . Macrodantin [Nitrofurantoin Macrocrystal] Other (See Comments)    [place]    . Prednisone Nausea And Vomiting  . Sulfa Antibiotics Other (See Comments)    [place]helps    . Clindamycin Hcl Rash    All over torso     Social History   Tobacco Use  . Smoking status: Former Smoker  Substance Use Topics  . Alcohol use: No    No family history on file.   Review of Systems  Constitutional: Negative for chills and fever.  HENT: Negative for congestion, sore throat and tinnitus.   Eyes: Negative for double vision, photophobia and pain.  Respiratory: Negative for cough, shortness of breath and wheezing.   Cardiovascular: Negative for chest pain, palpitations and orthopnea.  Gastrointestinal: Negative for heartburn, nausea and vomiting.  Genitourinary: Negative for dysuria, frequency and urgency.  Musculoskeletal: Positive for joint pain.  Neurological: Negative for dizziness, weakness and headaches.    Objective:  Physical Exam  Well nourished and well developed.  General: Alert and oriented x3, cooperative and pleasant, no acute distress.  Head: normocephalic, atraumatic, neck supple.  Eyes: EOMI.  Respiratory: breath sounds clear in all fields, no wheezing, rales, or rhonchi. Cardiovascular: Regular rate and rhythm, no murmurs, gallops or rubs.  Abdomen: non-tender to palpation and soft, normoactive bowel sounds. Musculoskeletal:  Right Hip Exam: ROM: Flexion to 90, Internal Rotation 0, External Rotation 0, and abduction 20 with discomfort.  There is no tenderness over the greater trochanter bursa.   Calves soft and nontender. Motor  function intact in LE. Strength 5/5 LE bilaterally. Neuro: Distal pulses 2+. Sensation to light touch intact in LE.  Vital signs in last 24 hours: Blood pressure: 126/68 mmHg Pulse: 60 bpm  Labs:   Estimated body mass index is 29.27 kg/m as calculated from the following:   Height as of 02/01/13: 5\' 5"  (1.651 m).   Weight as of 02/01/13: 79.8 kg.   Imaging Review Plain radiographs demonstrate severe degenerative joint disease of the right hip(s). The bone quality appears to be adequate for age and reported activity level.      Assessment/Plan:  End stage arthritis, right hip(s)  The patient history, physical examination, clinical judgement of the provider and imaging studies are consistent with end stage degenerative joint disease of the right hip(s) and total hip arthroplasty is deemed medically necessary. The treatment options including medical management, injection therapy, arthroscopy and arthroplasty were discussed at length. The risks and benefits of  total hip arthroplasty were presented and reviewed. The risks due to aseptic loosening, infection, stiffness, dislocation/subluxation,  thromboembolic complications and other imponderables were discussed.  The patient acknowledged the explanation, agreed to proceed with the plan and consent was signed. Patient is being admitted for inpatient treatment for surgery, pain control, PT, OT, prophylactic antibiotics, VTE prophylaxis, progressive ambulation and ADL's and discharge planning.The patient is planning to be discharged home.   Anticipated LOS equal to or greater than 2 midnights due to - Age 57 and older with one or more of the following:  - Obesity  - Expected need for hospital services (PT, OT, Nursing) required for safe  discharge  - Anticipated need for postoperative skilled nursing care or inpatient rehab  - Active co-morbidities: Diabetes OR   - Unanticipated findings during/Post Surgery: None  - Patient is a high risk  of re-admission due to: None   Therapy Plans: HEP Disposition: Home with sister Planned DVT Prophylaxis: Xarelto 10 mg daily (hx DVT) DME needed: [facility] PCP: Dr. Quillian Quince TXA: Topical (hx DVT) Allergies: Sulfa, prednisone (n/v) Anesthesia Concerns: None BMI: 28.8 Last HgbA1c: 6.9% as of 07/26/2019  - Patient was instructed on what medications to stop prior to surgery. - Follow-up visit in 2 weeks with Dr. Wynelle Link - Begin physical therapy following surgery - Pre-operative lab work as pre-surgical testing - Prescriptions will be provided in hospital at time of discharge  Theresa Duty, PA-C Orthopedic Surgery [facility] [place]

## 2019-05-18 DIAGNOSIS — J301 Allergic rhinitis due to pollen: Secondary | ICD-10-CM | POA: Insufficient documentation

## 2019-05-18 DIAGNOSIS — F331 Major depressive disorder, recurrent, moderate: Secondary | ICD-10-CM | POA: Insufficient documentation

## 2019-05-24 NOTE — Patient Instructions (Addendum)
Angelica Wood   Your procedure is scheduled on: 05-30-2019   Report to [facility] Main  Entrance Report to admitting at 920  AM      Call this number if you have problems the morning of surgery [phone]    Remember: . BRUSH YOUR TEETH MORNING OF SURGERY AND RINSE YOUR MOUTH OUT           , NO CHEWING GUM CANDY OR MINTS.    NO SOLID FOOD AFTER MIDNIGHT THE NIGHT PRIOR TO SURGERY         . NOTHING BY MOUTH EXCEPT CLEAR LIQUIDS UNTIL 430 AM.         . PLEASE FINISH G2 DRINK PER SURGEON ORDER  NEEDS TO BE COMPLETED AT 430 AM.    CLEAR LIQUID DIET   Foods Allowed                                                                     Foods Excluded  Coffee and tea, regular and decaf                             liquids that you cannot  Plain Jell-O in any flavor                                             see through such as: Fruit ices (not with fruit pulp)                                     milk, soups, orange juice  Iced Popsicles                                    All solid food Carbonated beverages, regular and diet                                    Cranberry, grape and apple juices Sports drinks like Gatorade Lightly seasoned clear broth or consume(fat free) Sugar, honey syrup  Sample Menu Breakfast                                Lunch                                     Supper Cranberry juice                    Beef broth                            Chicken broth Jell-O  Grape juice                           Apple juice Coffee or tea                        Jell-O                                      Popsicle                                                Coffee or tea                        Coffee or tea  _____________________________________________________________________ How to Manage Your Diabetes Before and After Surgery  Why is it important to control my blood sugar before and after  surgery? . Improving blood sugar levels before and after surgery helps healing and can limit problems. . A way of improving blood sugar control is eating a healthy diet by: o  Eating less sugar and carbohydrates o  Increasing activity/exercise o  Talking with your doctor about reaching your blood sugar goals . High blood sugars (greater than 180 mg/dL) can raise your risk of infections and slow your recovery, so you will need to focus on controlling your diabetes during the weeks before surgery. . Make sure that the doctor who takes care of your diabetes knows about your planned surgery including the date and location.  How do I manage my blood sugar before surgery? . Check your blood sugar at least 4 times a day, starting 2 days before surgery, to make sure that the level is not too high or low. o Check your blood sugar the morning of your surgery when you wake up and every 2 hours until you get to the Short Stay unit. . If your blood sugar is less than 70 mg/dL, you will need to treat for low blood sugar: o Do not take insulin. o Treat a low blood sugar (less than 70 mg/dL) with  cup of clear juice (cranberry or apple), 4 glucose tablets, OR glucose gel. o Recheck blood sugar in 15 minutes after treatment (to make sure it is greater than 70 mg/dL). If your blood sugar is not greater than 70 mg/dL on recheck, call [phone] for further instructions. . Report your blood sugar to the short stay nurse when you get to Short Stay.  . If you are admitted to the hospital after surgery: o Your blood sugar will be checked by the staff and you will probably be given insulin after surgery (instead of oral diabetes medicines) to make sure you have good blood sugar levels. o The goal for blood sugar control after surgery is 80-180 mg/dL.   WHAT DO I DO ABOUT MY DIABETES MEDICATION?  Marland Kitchen   . THE DAY BEFORE SURGERY TAKE LEVEMIR INSULIN AS USUAL IN MORNING  80 units    THE MORNING OF SURGERY TAKE  1/2 DOSE OF LEVEMIR INSULIN.40 units  . If your CBG is greater than 220 mg/dL, you may take  of your sliding scale  . (correction) dose of insulin.        Reviewed  and Endorsed by [facility] Patient Education Committee, August 2015    Take these medicines the morning of surgery with A SIP OF WATER:              LORAZEPAM (ATIVAN )           , DULOXETINE (CYMBALTA), METOPROLOL, LEVOTHYROXINE (SYNTHROID)                               You may not have any metal on your body including hair pins and              piercings            Do not wear jewelry, make-up, lotions, powders or perfumes, deodorant             Do not wear nail polish.  Do not shave  48 hours prior to surgery.     Do not bring valuables to the hospital. [place].  Contacts, dentures or bridgework may not be worn into surgery.     ht to your room. _____________________________________________________________________             [facility] - Preparing for Surgery Before surgery, you can play an important role.  Because skin is not sterile, your skin needs to be as free of germs as possible.  You can reduce the number of germs on your skin by washing with CHG (chlorahexidine gluconate) soap before surgery.  CHG is an antiseptic cleaner which kills germs and bonds with the skin to continue killing germs even after washing. Please DO NOT use if you have an allergy to CHG or antibacterial soaps.  If your skin becomes reddened/irritated stop using the CHG and inform your nurse when you arrive at Short Stay. Do not shave (including legs and underarms) for at least 48 hours prior to the first CHG shower.  You may shave your face/neck. Please follow these instructions carefully:  1.  Shower with CHG Soap the night before surgery and the  morning of Surgery.  2.  If you choose to wash your hair, wash your hair first as usual with your  normal  shampoo.  3.  After you  shampoo, rinse your hair and body thoroughly to remove the  shampoo.                           4.  Use CHG as you would any other liquid soap.  You can apply chg directly  to the skin and wash                       Gently with a scrungie or clean washcloth.  5.  Apply the CHG Soap to your body ONLY FROM THE NECK DOWN.   Do not use on face/ open                           Wound or open sores. Avoid contact with eyes, ears mouth and genitals (private parts).                       Wash face,  Genitals (private parts) with your normal soap.  6.  Wash thoroughly, paying special attention to the area where your surgery  will be performed.  7.  Thoroughly rinse your body with warm water from the neck down.  8.  DO NOT shower/wash with your normal soap after using and rinsing off  the CHG Soap.                9.  Pat yourself dry with a clean towel.            10.  Wear clean pajamas.            11.  Place clean sheets on your bed the night of your first shower and do not  sleep with pets. Day of Surgery : Do not apply any lotions/deodorants the morning of surgery.  Please wear clean clothes to the hospital/surgery center.  FAILURE TO FOLLOW THESE INSTRUCTIONS MAY RESULT IN THE CANCELLATION OF YOUR SURGERY PATIENT SIGNATURE_________________________________  NURSE SIGNATURE__________________________________  ________________________________________________________________________   Adam Phenix  An incentive spirometer is a tool that can help keep your lungs clear and active. This tool measures how well you are filling your lungs with each breath. Taking long deep breaths may help reverse or decrease the chance of developing breathing (pulmonary) problems (especially infection) following:  A long period of time when you are unable to move or be active. BEFORE THE PROCEDURE   If the spirometer includes an indicator to show your best effort, your nurse or respiratory therapist  will set it to a desired goal.  If possible, sit up straight or lean slightly forward. Try not to slouch.  Hold the incentive spirometer in an upright position. INSTRUCTIONS FOR USE  1. Sit on the edge of your bed if possible, or sit up as far as you can in bed or on a chair. 2. Hold the incentive spirometer in an upright position. 3. Breathe out normally. 4. Place the mouthpiece in your mouth and seal your lips tightly around it. 5. Breathe in slowly and as deeply as possible, raising the piston or the ball toward the top of the column. 6. Hold your breath for 3-5 seconds or for as long as possible. Allow the piston or ball to fall to the bottom of the column. 7. Remove the mouthpiece from your mouth and breathe out normally. 8. Rest for a few seconds and repeat Steps 1 through 7 at least 10 times every 1-2 hours when you are awake. Take your time and take a few normal breaths between deep breaths. 9. The spirometer may include an indicator to show your best effort. Use the indicator as a goal to work toward during each repetition. 10. After each set of 10 deep breaths, practice coughing to be sure your lungs are clear. If you have an incision (the cut made at the time of surgery), support your incision when coughing by placing a pillow or rolled up towels firmly against it. Once you are able to get out of bed, walk around indoors and cough well. You may stop using the incentive spirometer when instructed by your caregiver.  RISKS AND COMPLICATIONS  Take your time so you do not get dizzy or light-headed.  If you are in pain, you may need to take or ask for pain medication before doing incentive spirometry. It is harder to take a deep breath if you are having pain. AFTER USE  Rest and breathe slowly and easily.  It can be helpful to keep track of a log of your  progress. Your caregiver can provide you with a simple table to help with this. If you are using the spirometer at home, follow  these instructions: [place] IF:   You are having difficultly using the spirometer.  You have trouble using the spirometer as often as instructed.  Your pain medication is not giving enough relief while using the spirometer.  You develop fever of 100.5 F (38.1 C) or higher. SEEK IMMEDIATE MEDICAL CARE IF:   You cough up bloody sputum that had not been present before.  You develop fever of 102 F (38.9 C) or greater.  You develop worsening pain at or near the incision site. MAKE SURE YOU:   Understand these instructions.  Will watch your condition.  Will get help right away if you are not doing well or get worse. Document Released: 04/11/2007 Document Revised: 02/21/2012 Document Reviewed: 06/12/2007 Exit[employer] Patient Information 2014 [employer], [place].   ________________________________________________________________________  WHAT IS A BLOOD TRANSFUSION? Blood Transfusion Information  A transfusion is the replacement of blood or some of its parts. Blood is made up of multiple cells which provide different functions.  Red blood cells carry oxygen and are used for blood loss replacement.  White blood cells fight against infection.  Platelets control bleeding.  Plasma helps clot blood.  Other blood products are available for specialized needs, such as hemophilia or other clotting disorders. BEFORE THE TRANSFUSION  Who gives blood for transfusions?   Healthy volunteers who are fully evaluated to make sure their blood is safe. This is blood bank blood. Transfusion therapy is the safest it has ever been in the practice of medicine. Before blood is taken from a donor, a complete history is taken to make sure that person has no history of diseases nor engages in risky social behavior (examples are intravenous drug use or sexual activity with multiple partners). The donor's travel history is screened to minimize risk of transmitting infections, such as malaria. The  donated blood is tested for signs of infectious diseases, such as HIV and hepatitis. The blood is then tested to be sure it is compatible with you in order to minimize the chance of a transfusion reaction. If you or a relative donates blood, this is often done in anticipation of surgery and is not appropriate for emergency situations. It takes many days to process the donated blood. RISKS AND COMPLICATIONS Although transfusion therapy is very safe and saves many lives, the main dangers of transfusion include:   Getting an infectious disease.  Developing a transfusion reaction. This is an allergic reaction to something in the blood you were given. Every precaution is taken to prevent this. The decision to have a blood transfusion has been considered carefully by your caregiver before blood is given. Blood is not given unless the benefits outweigh the risks. AFTER THE TRANSFUSION  Right after receiving a blood transfusion, you will usually feel much better and more energetic. This is especially true if your red blood cells have gotten low (anemic). The transfusion raises the level of the red blood cells which carry oxygen, and this usually causes an energy increase.  The nurse administering the transfusion will monitor you carefully for complications. HOME CARE INSTRUCTIONS  No special instructions are needed after a transfusion. You may find your energy is better. Speak with your caregiver about any limitations on activity for underlying diseases you may have. SEEK MEDICAL CARE IF:   Your condition is not improving after your transfusion.  You develop redness or  irritation at the intravenous (IV) site. SEEK IMMEDIATE MEDICAL CARE IF:  Any of the following symptoms occur over the next 12 hours:  Shaking chills.  You have a temperature by mouth above 102 F (38.9 C), not controlled by medicine.  Chest, back, or muscle pain.  People around you feel you are not acting correctly or are  confused.  Shortness of breath or difficulty breathing.  Dizziness and fainting.  You get a rash or develop hives.  You have a decrease in urine output.  Your urine turns a dark color or changes to pink, red, or brown. Any of the following symptoms occur over the next 10 days:  You have a temperature by mouth above 102 F (38.9 C), not controlled by medicine.  Shortness of breath.  Weakness after normal activity.  The white part of the eye turns yellow (jaundice).  You have a decrease in the amount of urine or are urinating less often.  Your urine turns a dark color or changes to pink, red, or brown. Document Released: 11/26/2000 Document Revised: 02/21/2012 Document Reviewed: 07/15/2008 [facility] Patient Information 2014 [place], [place].  _______________________________________________________________________

## 2019-05-25 ENCOUNTER — Encounter (HOSPITAL_COMMUNITY)
Admission: RE | Admit: 2019-05-25 | Discharge: 2019-05-25 | Disposition: A | Discharge status: Home / Self Care | Payer: Medicare Other | Source: Ambulatory Visit | Attending: Orthopedic Surgery | Admitting: Orthopedic Surgery

## 2019-05-25 ENCOUNTER — Other Ambulatory Visit: Payer: Self-pay

## 2019-05-25 ENCOUNTER — Other Ambulatory Visit (HOSPITAL_COMMUNITY)
Admission: RE | Admit: 2019-05-25 | Discharge: 2019-05-25 | Disposition: A | Discharge status: Home / Self Care | Payer: Medicare Other | Source: Ambulatory Visit | Attending: Orthopedic Surgery | Admitting: Orthopedic Surgery

## 2019-05-25 ENCOUNTER — Encounter (HOSPITAL_COMMUNITY): Payer: Self-pay

## 2019-05-25 DIAGNOSIS — Z01818 Encounter for other preprocedural examination: Secondary | ICD-10-CM | POA: Insufficient documentation

## 2019-05-25 DIAGNOSIS — Z1159 Encounter for screening for other viral diseases: Secondary | ICD-10-CM | POA: Insufficient documentation

## 2019-05-25 DIAGNOSIS — E119 Type 2 diabetes mellitus without complications: Secondary | ICD-10-CM | POA: Insufficient documentation

## 2019-05-25 DIAGNOSIS — R9431 Abnormal electrocardiogram [ECG] [EKG]: Secondary | ICD-10-CM | POA: Diagnosis not present

## 2019-05-25 DIAGNOSIS — I4510 Unspecified right bundle-branch block: Secondary | ICD-10-CM | POA: Insufficient documentation

## 2019-05-25 LAB — COMPREHENSIVE METABOLIC PANEL
ALT: 26 U/L (ref 0–44)
AST: 20 U/L (ref 15–41)
Albumin: 3.8 g/dL (ref 3.5–5.0)
Alkaline Phosphatase: 60 U/L (ref 38–126)
Anion gap: 6 (ref 5–15)
BUN: 43 mg/dL — ABNORMAL HIGH (ref 8–23)
CO2: 28 mmol/L (ref 22–32)
Calcium: 8.8 mg/dL — ABNORMAL LOW (ref 8.9–10.3)
Chloride: 103 mmol/L (ref 98–111)
Creatinine, Ser: 0.94 mg/dL (ref 0.44–1.00)
GFR calc Af Amer: >60 mL/min (ref >60)
GFR calc non Af Amer: 57 mL/min — ABNORMAL LOW (ref >60)
Glucose, Bld: 171 mg/dL — ABNORMAL HIGH (ref 70–99)
Potassium: 4 mmol/L (ref 3.5–5.1)
Sodium: 137 mmol/L (ref 135–145)
Total Bilirubin: 0.7 mg/dL (ref 0.3–1.2)
Total Protein: 6.6 g/dL (ref 6.5–8.1)

## 2019-05-25 LAB — ABO/RH: ABO/RH(D): A POS

## 2019-05-25 LAB — CBC
HCT: 38.6 % (ref 36.0–46.0)
Hemoglobin: 12.2 g/dL (ref 12.0–15.0)
MCH: 28.2 pg (ref 26.0–34.0)
MCHC: 31.6 g/dL (ref 30.0–36.0)
MCV: 89.1 fL (ref 80.0–100.0)
Platelets: 256 10*3/uL (ref 150–400)
RBC: 4.33 MIL/uL (ref 3.87–5.11)
RDW: 13.9 % (ref 11.5–15.5)
WBC: 7.6 10*3/uL (ref 4.0–10.5)
nRBC: 0 % (ref 0.0–0.2)

## 2019-05-25 LAB — HEMOGLOBIN A1C
Hgb A1c MFr Bld: 8.6 % — ABNORMAL HIGH (ref 4.8–5.6)
Mean Plasma Glucose: 200.12 mg/dL

## 2019-05-25 LAB — PROTIME-INR
INR: 1 (ref 0.8–1.2)
Prothrombin Time: 12.7 seconds (ref 11.4–15.2)

## 2019-05-25 LAB — APTT: aPTT: 32 seconds (ref 24–36)

## 2019-05-25 LAB — SURGICAL PCR SCREEN
MRSA, PCR: NEGATIVE
Staphylococcus aureus: NEGATIVE

## 2019-05-25 LAB — GLUCOSE, CAPILLARY: Glucose-Capillary: 156 mg/dL — ABNORMAL HIGH (ref 70–99)

## 2019-05-25 NOTE — Progress Notes (Signed)
Covid Test done today 05/25/19 prior to PAT visit.  Pt thought she was pulling up for PAT visit and They did her test. Surgery date is 6/17. Pt has been in quarantine for weeks and will remain in quarantine until day of surgery.

## 2019-05-26 LAB — NOVEL CORONAVIRUS, NAA (HOSP ORDER, SEND-OUT TO REF LAB; TAT 18-24 HRS): SARS-CoV-2, NAA: NOT DETECTED

## 2019-05-28 NOTE — Progress Notes (Signed)
Anesthesia Chart Review   Case: [id number] Date: 05/30/19   Procedure: TOTAL HIP ARTHROPLASTY ANTERIOR APPROACH (Right ) - 138min   Anesthesia type: Choice   Pre-op diagnosis: right hip osteoarthritis   Location: [facility]   Surgeon: Gaynelle Arabian, MD      DISCUSSION: 83 yo former smoker with h/o PONV, sleep apnea (intermittent use of cpap), HTN, DM II, GERD, hypothyroidism, fibromyalgia, right hip OA scheduled for above procedure 05/30/2019 with Dr. Gaynelle Arabian.   A1C 8.6 at PAT visit 05/25/2019.  Surgeon made aware.   VS: BP (!) 137/57   Pulse 66   Temp 37 C (Oral)   Resp 20   Ht 5\' 5"  (1.651 m)   Wt 80.3 kg   SpO2 100%   BMI 29.45 kg/m   PROVIDERS: Caryl Bis, MD is PCP    LABS: Surgeon notified of A1C (all labs ordered are listed, but only abnormal results are displayed)  Labs Reviewed  COMPREHENSIVE METABOLIC PANEL - Abnormal; Notable for the following components:      Result Value   Glucose, Bld 171 (*)    BUN 43 (*)    Calcium 8.8 (*)    GFR calc non Af Amer 57 (*)    All other components within normal limits  HEMOGLOBIN A1C - Abnormal; Notable for the following components:   Hgb A1c MFr Bld 8.6 (*)    All other components within normal limits  GLUCOSE, CAPILLARY - Abnormal; Notable for the following components:   Glucose-Capillary 156 (*)    All other components within normal limits  SURGICAL PCR SCREEN  APTT  CBC  PROTIME-INR  TYPE AND SCREEN  ABO/RH     IMAGES:   EKG: 05/25/2019 Rate 71 bpm Normal sinus rhythm  Right bundle branch block   CV:  Past Medical History:  Diagnosis Date  . Anemia    hx of  . Arthritis   . Bronchitis    hx of  . Chronic fatigue   . Diabetes mellitus   . Fibromyalgia   . GERD (gastroesophageal reflux disease)   . Hypertension    [name]  . Hypothyroidism   . Irritable bowel syndrome (IBS)    hx of  . Migraine    hx of  . Pneumonia    hx of  . PONV (postoperative nausea and vomiting)   . Shortness  of breath    with exertion  . Sleep apnea    uses cpap sparingly  . Thyroid disease   . Urgency of urination   . Urinary incontinence   . Vertigo    hx of    Past Surgical History:  Procedure Laterality Date  . ABDOMINAL HYSTERECTOMY    . APPENDECTOMY    . CHOLECYSTECTOMY    . COLONOSCOPY W/ POLYPECTOMY    . DILATION AND CURETTAGE OF UTERUS    . EYE SURGERY     right cataract removed, laser surgery for glaucoma  . TONSILLECTOMY    . TOTAL SHOULDER ARTHROPLASTY  06/29/2012   Procedure: TOTAL SHOULDER ARTHROPLASTY;  Surgeon: Marin Shutter, MD;  Location: [place] [facility];  Service: Orthopedics;  Laterality: Left;  left total shoulder arthroplasty  . TOTAL SHOULDER ARTHROPLASTY Right 02/08/2013   Procedure: TOTAL SHOULDER ARTHROPLASTY;  Surgeon: Marin Shutter, MD;  Location: [place];  Service: Orthopedics;  Laterality: Right;    MEDICATIONS: . albuterol (PROVENTIL HFA;VENTOLIN HFA) 108 (90 BASE) MCG/ACT inhaler  . b complex vitamins tablet  . Cholecalciferol (VITAMIN D) 50  MCG (2000 UT) CAPS  . clobetasol cream (TEMOVATE) 0.05 %  . DULoxetine (CYMBALTA) 60 MG capsule  . hydrOXYzine (ATARAX/VISTARIL) 25 MG tablet  . insulin detemir (LEVEMIR) 100 UNIT/ML injection  . levothyroxine (SYNTHROID, LEVOTHROID) 100 MCG tablet  . LORazepam (ATIVAN) 0.5 MG tablet  . losartan-hydrochlorothiazide (HYZAAR) 100-12.5 MG per tablet  . metoprolol (LOPRESSOR) 50 MG tablet  . minocycline (MINOCIN) 100 MG capsule  . montelukast (SINGULAIR) 10 MG tablet  . Multiple Vitamins-Minerals (MULTI-BETIC DIABETES) TABS  . oxyCODONE-acetaminophen (PERCOCET/ROXICET) 5-325 MG per tablet  . tiZANidine (ZANAFLEX) 4 MG capsule  . triamcinolone cream (KENALOG) 0.1 %   No current facility-administered medications for this encounter.      Maia Plan [facility]urgical [facility] [phone] 05/28/19 3:34 PM

## 2019-05-30 LAB — TYPE AND SCREEN
ABO/RH(D): A POS
Antibody Screen: NEGATIVE

## 2019-07-18 NOTE — H&P (Signed)
TOTAL HIP ADMISSION H&P  Patient is admitted for right total hip arthroplasty.  Subjective:  Chief Complaint: right hip pain  HPI: Angelica Wood, 83 y.o. female, has a history of pain and functional disability in the right hip(s) due to arthritis and patient has failed non-surgical conservative treatments for greater than 12 weeks to include NSAID's and/or analgesics, use of assistive devices and activity modification.  Onset of symptoms was gradual starting several years ago with gradually worsening course since that time.The patient noted no past surgery on the right hip(s).  Patient currently rates pain in the right hip at 9 out of 10 with activity. Patient has night pain, worsening of pain with activity and weight bearing, pain that interfers with activities of daily living and crepitus. Patient has evidence of severe bone-on-bone osteoarthritis of the right hip with large subchondral cysts and large osteophytes, she has massive osteophytes within the lateral aspect by imaging studies. This condition presents safety issues increasing the risk of falls. There is no current active infection.  Patient Active Problem List   Diagnosis Date Noted   H/O total shoulder replacement 02/27/2013   Pain in joint, shoulder region 02/27/2013   Muscle weakness (generalized) 02/27/2013   Shoulder arthritis 06/30/2012   Past Medical History:  Diagnosis Date   Anemia    hx of   Arthritis    Bronchitis    hx of   Chronic fatigue    Diabetes mellitus    Fibromyalgia    GERD (gastroesophageal reflux disease)    Hypertension    [name]   Hypothyroidism    Irritable bowel syndrome (IBS)    hx of   Migraine    hx of   Pneumonia    hx of   PONV (postoperative nausea and vomiting)    Shortness of breath    with exertion   Sleep apnea    uses cpap sparingly   Thyroid disease    Urgency of urination    Urinary incontinence    Vertigo    hx of    Past Surgical History:    Procedure Laterality Date   ABDOMINAL HYSTERECTOMY     APPENDECTOMY     CHOLECYSTECTOMY     COLONOSCOPY W/ POLYPECTOMY     DILATION AND CURETTAGE OF UTERUS     EYE SURGERY     right cataract removed, laser surgery for glaucoma   TONSILLECTOMY     TOTAL SHOULDER ARTHROPLASTY  06/29/2012   Procedure: TOTAL SHOULDER ARTHROPLASTY;  Surgeon: Marin Shutter, MD;  Location: [place];  Service: Orthopedics;  Laterality: Left;  left total shoulder arthroplasty   TOTAL SHOULDER ARTHROPLASTY Right 02/08/2013   Procedure: TOTAL SHOULDER ARTHROPLASTY;  Surgeon: Marin Shutter, MD;  Location: [place];  Service: Orthopedics;  Laterality: Right;    No current facility-administered medications for this encounter.    Current Outpatient Medications  Medication Sig Dispense Refill Last Dose   albuterol (PROVENTIL HFA;VENTOLIN HFA) 108 (90 BASE) MCG/ACT inhaler Inhale 2 puffs into the lungs every 6 (six) hours as needed for wheezing.      b complex vitamins tablet Take 1 tablet by mouth daily.      Cholecalciferol (VITAMIN D) 50 MCG (2000 UT) CAPS Take 2,000 Units by mouth daily.      clobetasol cream (TEMOVATE) 3.66 % Apply 1 application topically daily.      DULoxetine (CYMBALTA) 60 MG capsule Take 60 mg by mouth 2 (two) times daily.  hydrOXYzine (ATARAX/VISTARIL) 25 MG tablet Take 25 mg by mouth at bedtime.      insulin detemir (LEVEMIR) 100 UNIT/ML injection Inject 80 Units into the skin daily.      levothyroxine (SYNTHROID, LEVOTHROID) 100 MCG tablet Take 100 mcg by mouth daily before breakfast.       LORazepam (ATIVAN) 0.5 MG tablet Take 0.5 mg by mouth daily as needed for anxiety.      losartan-hydrochlorothiazide (HYZAAR) 100-12.5 MG per tablet Take 1 tablet by mouth daily.      metoprolol (LOPRESSOR) 50 MG tablet Take 50 mg by mouth daily.       minocycline (MINOCIN) 100 MG capsule Take 100 mg by mouth 2 (two) times daily.      montelukast (SINGULAIR) 10 MG tablet Take 10 mg by  mouth at bedtime.      Multiple Vitamins-Minerals (MULTI-BETIC DIABETES) TABS Take 2 tablets by mouth daily.      triamcinolone cream (KENALOG) 0.1 % Apply 1 application topically 2 (two) times daily.      oxyCODONE-acetaminophen (PERCOCET/ROXICET) 5-325 MG per tablet Take 1-2 tablets by mouth every 4 (four) hours as needed. (Patient not taking: Reported on 05/24/2019) 80 tablet 0 Not Taking at Unknown time   tiZANidine (ZANAFLEX) 4 MG capsule Take 1 capsule (4 mg total) by mouth 3 (three) times daily as needed for muscle spasms. (Patient not taking: Reported on 05/24/2019) 30 capsule 1 Not Taking at Unknown time   Allergies  Allergen Reactions   Glucophage [Metformin Hydrochloride] Diarrhea   Macrodantin [Nitrofurantoin Macrocrystal] Other (See Comments)    [place]     Meloxicam Nausea Only    Dizziness   Prednisone Nausea And Vomiting   Sulfa Antibiotics Other (See Comments)    [place]ps     Clindamycin Hcl Rash    All over torso     Social History   Tobacco Use   Smoking status: Former Smoker   Smokeless tobacco: Never Used  Substance Use Topics   Alcohol use: No    No family history on file.   Review of Systems  Constitutional: Negative for chills and fever.  HENT: Negative for congestion, sore throat and tinnitus.   Eyes: Negative for double vision, photophobia and pain.  Respiratory: Negative for cough, shortness of breath and wheezing.   Cardiovascular: Negative for chest pain, palpitations and orthopnea.  Gastrointestinal: Negative for heartburn, nausea and vomiting.  Genitourinary: Negative for dysuria, frequency and urgency.  Musculoskeletal: Positive for joint pain.  Neurological: Negative for dizziness, weakness and headaches.    Objective:  Physical Exam  Well nourished and well developed.  General: Alert and oriented x3, cooperative and pleasant, no acute distress.  Head: normocephalic, atraumatic, neck supple.  Eyes: EOMI.  Respiratory: breath  sounds clear in all fields, no wheezing, rales, or rhonchi. Cardiovascular: Regular rate and rhythm, no murmurs, gallops or rubs.  Abdomen: non-tender to palpation and soft, normoactive bowel sounds. Musculoskeletal:  Right Hip Exam: ROM: Flexion to 90, Internal Rotation 0, External Rotation 0, and abduction 20 with discomfort.  There is no tenderness over the greater trochanter bursa.   Calves soft and nontender. Motor function intact in LE. Strength 5/5 LE bilaterally. Neuro: Distal pulses 2+. Sensation to light touch intact in LE.  Vital signs in last 24 hours:  Blood pressure: 144/88 mmHg Pulse: 80 bpm  Labs:   Estimated body mass index is 29.45 kg/m as calculated from the following:   Height as of 05/25/19: 5\' 5"  (1.651 m).  Weight as of 05/25/19: 80.3 kg.   Imaging Review Plain radiographs demonstrate severe degenerative joint disease of the right hip(s). The bone quality appears to be adequate for age and reported activity level.  Assessment/Plan:  End stage arthritis, right hip(s)  The patient history, physical examination, clinical judgement of the provider and imaging studies are consistent with end stage degenerative joint disease of the right hip(s) and total hip arthroplasty is deemed medically necessary. The treatment options including medical management, injection therapy, arthroscopy and arthroplasty were discussed at length. The risks and benefits of total hip arthroplasty were presented and reviewed. The risks due to aseptic loosening, infection, stiffness, dislocation/subluxation,  thromboembolic complications and other imponderables were discussed.  The patient acknowledged the explanation, agreed to proceed with the plan and consent was signed. Patient is being admitted for inpatient treatment for surgery, pain control, PT, OT, prophylactic antibiotics, VTE prophylaxis, progressive ambulation and ADL's and discharge planning.The patient is planning to be discharged  home.   Anticipated LOS equal to or greater than 2 midnights due to - Age 36 and older with one or more of the following:  - Obesity  - Expected need for hospital services (PT, OT, Nursing) required for safe  discharge  - Anticipated need for postoperative skilled nursing care or inpatient rehab  - Active co-morbidities: Diabetes and DVT/VTE OR   - Unanticipated findings during/Post Surgery: None  - Patient is a high risk of re-admission due to: None  Therapy Plans: HEP vs. HHPT Disposition: Home with sister Planned DVT Prophylaxis: Xarelto 10 mg daily (hx DVT) DME needed: [facility] PCP: Dr. Quillian Quince TXA: Topical (hx DVT) Allergies: Sulfa, prednisone (nausea/vomiting) Anesthesia Concerns: None BMI: 28.8 Last HgbA1c: Surgery previously cancelled due to HgbA1c of 8.6%. Has been working with endocrinologist since cancellation. Will recheck with pre-op labs.  Other: Pt has CKD Stage III  - Patient was instructed on what medications to stop prior to surgery. - Follow-up visit in 2 weeks with Dr. Wynelle Link - Begin physical therapy following surgery - Pre-operative lab work as pre-surgical testing - Prescriptions will be provided in hospital at time of discharge  Theresa Duty, PA-C Orthopedic Surgery [facility] [place]

## 2019-07-25 NOTE — Progress Notes (Signed)
NEED ORDERS IN Epic FOR 08-01-19 SURGERY, -PRE OP IS 07-26-19

## 2019-07-25 NOTE — Progress Notes (Signed)
EKG 05-25-19 EPIC

## 2019-07-25 NOTE — Patient Instructions (Addendum)
YOU NEED TO HAVE A COVID 19 TEST ON__sat_8/15____ @_______ , THIS TEST MUST BE DONE BEFORE SURGERY, COME  [address] [place] , [id number]. ONCE YOUR COVID TEST IS COMPLETED, PLEASE BEGIN THE QUARANTINE INSTRUCTIONS AS OUTLINED IN YOUR HANDOUT.                Angelica Wood    Your procedure is scheduled on:08-01-2019    Report to SHORT STAY AT 6:30 AM   1 VISITOR IS ALLOWED TO WAIT IN WAITING ROOM  ONLY DAY OF YOUR SURGERY.    Call this number if you have problems the morning of surgery [place] AND RINSE YOUR MOUTH OUT, NO CHEWING GUM CANDY OR MINTS.   Do not eat food After Midnight . YOU MAY HAVE CLEAR LIQUIDS FROM MIDNIGHT UNTIL 4:30AM . At 4:30AM Please finish the prescribed Pre-Surgery Gatorade drink.  Nothing by mouth after you finish the Gatorade drink !   Take these medicines the morning of surgery with A SIP OF WATER:   DO NOT TAKE ANY DIABETIC MEDICATIONS DAY OF YOUR SURGERY              How to Manage Your Diabetes  Before and After Surgery  Why is it important to control my blood sugar before and after surgery? . Improving blood sugar levels before and after surgery helps healing and can limit problems. . A way of improving blood sugar control is eating a healthy diet by: o  Eating less sugar and carbohydrates o  Increasing activity/exercise o  Talking with your doctor about reaching your blood sugar goals . High blood sugars (greater than 180 mg/dL) can raise your risk of infections and slow your recovery, so you will need to focus on controlling your diabetes during the weeks before surgery. . Make sure that the doctor who takes care of your diabetes knows about your planned surgery including the date and location.  How do I manage my blood sugar before surgery? . Check your blood sugar at least 4 times a day, starting 2 days before surgery, to make sure that the level is not too high or low. o Check your  blood sugar the morning of your surgery when you wake up and every 2 hours until you get to the Short Stay unit. . If your blood sugar is less than 70 mg/dL, you will need to treat for low blood sugar: o Do not take insulin. o Treat a low blood sugar (less than 70 mg/dL) with  cup of clear juice (cranberry or apple), 4 glucose tablets, OR glucose gel. o Recheck blood sugar in 15 minutes after treatment (to make sure it is greater than 70 mg/dL). If your blood sugar is not greater than 70 mg/dL on recheck, call [phone] for further instructions. . Report your blood sugar to the short stay nurse when you get to Short Stay.  . If you are admitted to the hospital after surgery: o Your blood sugar will be checked by the staff and you will probably be given insulin after surgery (instead of oral diabetes medicines) to make sure you have good blood sugar levels. o The goal for blood sugar control after surgery is 80-180 mg/dL.   WHAT DO I DO ABOUT MY DIABETES MEDICATION?   . THE  DAY BEFORE SURGERY TAKE LEVEMIR INSULIN AS USUAL.       THE MORNING OF SURGERY TAKE 1/2 USUAL DOSE OF  LEVEMIR INSULIN (TAKE 35 UNITS).                    You may not have any metal on your body including hair pins and              piercings              Do not wear jewelry, make-up, lotions, powders or perfumes, deodorant             Do not wear nail polish.  Do not shave  48 hours prior to surgery.                Do not bring valuables to the hospital. [place].  Contacts, dentures or bridgework may not be worn into surgery.        - Preparing for Surgery  Before surgery, you can play an important role .  Because skin is not sterile, your skin needs to be as free of germs as possible.   You can reduce the number of germs on your skin by washing with CHG (chlorahexidine gluconate) soap before surgery.   CHG is an antiseptic cleaner which  kills germs and bonds with the skin to continue killing germs even after washing. Please DO NOT use if you have an allergy to CHG or antibacterial soaps.   If your skin becomes reddened/irritated stop using the CHG and inform your nurse when you arrive at Short Stay. Do not shave (including legs and underarms) for at least 48 hours prior to the first CHG shower.  . Please follow these instructions carefully:  1.  Shower with CHG Soap the night before surgery and the  morning of Surgery.  2.  If you choose to wash your hair, wash your hair first as usual with your  normal  shampoo.  3.  After you shampoo, rinse your hair and body thoroughly to remove the  shampoo.                                        4.  Use CHG as you would any other liquid soap.  You can apply chg directly  to the skin and wash                       Gently with a scrungie or clean washcloth.  5.  Apply the CHG Soap to your body ONLY FROM THE NECK DOWN.   Do not use on face/ open                           Wound or open sores. Avoid contact with eyes, ears mouth and genitals (private parts).                       Wash face,  Genitals (private parts) with your normal soap.             6.  Wash thoroughly, paying special attention to the area where your surgery  will be performed.  7.  Thoroughly rinse your body with warm water from the neck down.  8.  DO NOT shower/wash with your normal soap after using and rinsing off  the  CHG Soap.             9.  Pat yourself dry with a clean towel.            10.  Wear clean pajamas.            11.  Place clean sheets on your bed the night of your first shower and do not  sleep with pets. Day of Surgery : Do not apply any lotions/deodorants the morning of surgery.  Please wear clean clothes to the hospital/surgery center.  FAILURE TO FOLLOW THESE INSTRUCTIONS MAY RESULT IN THE CANCELLATION OF YOUR SURGERY PATIENT SIGNATURE_________________________________  NURSE  SIGNATURE__________________________________  ________________________________________________________________________

## 2019-07-26 ENCOUNTER — Encounter (INDEPENDENT_AMBULATORY_CARE_PROVIDER_SITE_OTHER): Payer: Self-pay

## 2019-07-26 ENCOUNTER — Encounter (HOSPITAL_COMMUNITY)
Admission: RE | Admit: 2019-07-26 | Discharge: 2019-07-26 | Disposition: A | Discharge status: Home / Self Care | Payer: Medicare Other | Source: Ambulatory Visit | Attending: Orthopedic Surgery | Admitting: Orthopedic Surgery

## 2019-07-26 ENCOUNTER — Encounter (HOSPITAL_COMMUNITY): Payer: Self-pay

## 2019-07-26 ENCOUNTER — Other Ambulatory Visit: Payer: Self-pay

## 2019-07-26 DIAGNOSIS — Z01812 Encounter for preprocedural laboratory examination: Secondary | ICD-10-CM | POA: Insufficient documentation

## 2019-07-26 DIAGNOSIS — Z20828 Contact with and (suspected) exposure to other viral communicable diseases: Secondary | ICD-10-CM | POA: Diagnosis not present

## 2019-07-26 DIAGNOSIS — M1611 Unilateral primary osteoarthritis, right hip: Secondary | ICD-10-CM | POA: Diagnosis not present

## 2019-07-26 LAB — PROTIME-INR
INR: 0.9 (ref 0.8–1.2)
Prothrombin Time: 12.3 seconds (ref 11.4–15.2)

## 2019-07-26 LAB — CBC
HCT: 42.8 % (ref 36.0–46.0)
Hemoglobin: 13.4 g/dL (ref 12.0–15.0)
MCH: 28.3 pg (ref 26.0–34.0)
MCHC: 31.3 g/dL (ref 30.0–36.0)
MCV: 90.5 fL (ref 80.0–100.0)
Platelets: 268 10*3/uL (ref 150–400)
RBC: 4.73 MIL/uL (ref 3.87–5.11)
RDW: 13.3 % (ref 11.5–15.5)
WBC: 6 10*3/uL (ref 4.0–10.5)
nRBC: 0 % (ref 0.0–0.2)

## 2019-07-26 LAB — COMPREHENSIVE METABOLIC PANEL
ALT: 18 U/L (ref 0–44)
AST: 16 U/L (ref 15–41)
Albumin: 3.7 g/dL (ref 3.5–5.0)
Alkaline Phosphatase: 62 U/L (ref 38–126)
Anion gap: 9 (ref 5–15)
BUN: 23 mg/dL (ref 8–23)
CO2: 27 mmol/L (ref 22–32)
Calcium: 9.4 mg/dL (ref 8.9–10.3)
Chloride: 104 mmol/L (ref 98–111)
Creatinine, Ser: 1.02 mg/dL — ABNORMAL HIGH (ref 0.44–1.00)
GFR calc Af Amer: 60 mL/min — ABNORMAL LOW (ref >60)
GFR calc non Af Amer: 52 mL/min — ABNORMAL LOW (ref >60)
Glucose, Bld: 64 mg/dL — ABNORMAL LOW (ref 70–99)
Potassium: 4.2 mmol/L (ref 3.5–5.1)
Sodium: 140 mmol/L (ref 135–145)
Total Bilirubin: 0.4 mg/dL (ref 0.3–1.2)
Total Protein: 6.6 g/dL (ref 6.5–8.1)

## 2019-07-26 LAB — HEMOGLOBIN A1C
Hgb A1c MFr Bld: 6.9 % — ABNORMAL HIGH (ref 4.8–5.6)
Mean Plasma Glucose: 151.33 mg/dL

## 2019-07-26 LAB — APTT: aPTT: 30 seconds (ref 24–36)

## 2019-07-26 LAB — GLUCOSE, CAPILLARY: Glucose-Capillary: 142 mg/dL — ABNORMAL HIGH (ref 70–99)

## 2019-07-26 LAB — SURGICAL PCR SCREEN
MRSA, PCR: NEGATIVE
Staphylococcus aureus: NEGATIVE

## 2019-07-28 ENCOUNTER — Other Ambulatory Visit (HOSPITAL_COMMUNITY)
Admission: RE | Admit: 2019-07-28 | Discharge: 2019-07-28 | Disposition: A | Discharge status: Home / Self Care | Payer: Medicare Other | Source: Ambulatory Visit | Attending: Orthopedic Surgery | Admitting: Orthopedic Surgery

## 2019-07-28 DIAGNOSIS — Z01812 Encounter for preprocedural laboratory examination: Secondary | ICD-10-CM | POA: Diagnosis not present

## 2019-07-28 LAB — SARS CORONAVIRUS 2 (TAT 6-24 HRS): SARS Coronavirus 2: NEGATIVE

## 2019-07-31 NOTE — Anesthesia Preprocedure Evaluation (Addendum)
Anesthesia Evaluation  Patient identified by MRN, date of birth, ID band Patient awake    Reviewed: Allergy & Precautions, NPO status , Patient's Chart, lab work & pertinent test results, reviewed documented beta blocker date and time   History of Anesthesia Complications (+) PONV and history of anesthetic complications  Airway Mallampati: II  TM Distance: >3 FB Neck ROM: Full    Dental no notable dental hx. (+) Teeth Intact, Dental Advisory Given   Pulmonary asthma , sleep apnea , former smoker,    Pulmonary exam normal breath sounds clear to auscultation       Cardiovascular hypertension, Pt. on home beta blockers and Pt. on medications Normal cardiovascular exam Rhythm:Regular Rate:Normal     Neuro/Psych    GI/Hepatic GERD  ,  Endo/Other  diabetes, Insulin DependentHypothyroidism   Renal/GU      Musculoskeletal  (+) Arthritis , Fibromyalgia -  Abdominal   Peds  Hematology   Anesthesia Other Findings   Reproductive/Obstetrics                            Anesthesia Physical Anesthesia Plan  ASA: III  Anesthesia Plan: Spinal   Post-op Pain Management:    Induction:   PONV Risk Score and Plan: 3 and Treatment may vary due to age or medical condition and Propofol infusion  Airway Management Planned: Natural Airway  Additional Equipment:   Intra-op Plan:   Post-operative Plan:   Informed Consent: I have reviewed the patients History and Physical, chart, labs and discussed the procedure including the risks, benefits and alternatives for the proposed anesthesia with the patient or authorized representative who has indicated his/her understanding and acceptance.     Dental advisory given  Plan Discussed with: CRNA  Anesthesia Plan Comments:         Anesthesia Quick Evaluation

## 2019-08-01 ENCOUNTER — Inpatient Hospital Stay (HOSPITAL_COMMUNITY)
Admission: RE | Admit: 2019-08-01 | Discharge: 2019-08-04 | DRG: 470 | Disposition: A | Discharge status: Home Health | Payer: Medicare Other | Attending: Orthopedic Surgery | Admitting: Orthopedic Surgery

## 2019-08-01 ENCOUNTER — Inpatient Hospital Stay (HOSPITAL_COMMUNITY): Payer: Medicare Other

## 2019-08-01 ENCOUNTER — Inpatient Hospital Stay (HOSPITAL_COMMUNITY): Payer: Medicare Other | Admitting: Physician Assistant

## 2019-08-01 ENCOUNTER — Encounter (HOSPITAL_COMMUNITY): Payer: Self-pay | Admitting: Anesthesiology

## 2019-08-01 ENCOUNTER — Encounter (HOSPITAL_COMMUNITY)
Admission: RE | Disposition: A | Discharge status: Home Health | Payer: Self-pay | Source: Home / Self Care | Attending: Orthopedic Surgery

## 2019-08-01 ENCOUNTER — Inpatient Hospital Stay (HOSPITAL_COMMUNITY): Payer: Medicare Other | Admitting: Anesthesiology

## 2019-08-01 ENCOUNTER — Other Ambulatory Visit: Payer: Self-pay

## 2019-08-01 DIAGNOSIS — Z96611 Presence of right artificial shoulder joint: Secondary | ICD-10-CM | POA: Diagnosis present

## 2019-08-01 DIAGNOSIS — Z87891 Personal history of nicotine dependence: Secondary | ICD-10-CM

## 2019-08-01 DIAGNOSIS — K589 Irritable bowel syndrome without diarrhea: Secondary | ICD-10-CM | POA: Diagnosis not present

## 2019-08-01 DIAGNOSIS — E039 Hypothyroidism, unspecified: Secondary | ICD-10-CM | POA: Diagnosis present

## 2019-08-01 DIAGNOSIS — Z20828 Contact with and (suspected) exposure to other viral communicable diseases: Secondary | ICD-10-CM | POA: Diagnosis present

## 2019-08-01 DIAGNOSIS — Z9049 Acquired absence of other specified parts of digestive tract: Secondary | ICD-10-CM

## 2019-08-01 DIAGNOSIS — Z79899 Other long term (current) drug therapy: Secondary | ICD-10-CM

## 2019-08-01 DIAGNOSIS — M25751 Osteophyte, right hip: Secondary | ICD-10-CM | POA: Diagnosis present

## 2019-08-01 DIAGNOSIS — Z888 Allergy status to other drugs, medicaments and biological substances status: Secondary | ICD-10-CM

## 2019-08-01 DIAGNOSIS — Z96649 Presence of unspecified artificial hip joint: Secondary | ICD-10-CM

## 2019-08-01 DIAGNOSIS — Z794 Long term (current) use of insulin: Secondary | ICD-10-CM | POA: Diagnosis not present

## 2019-08-01 DIAGNOSIS — K219 Gastro-esophageal reflux disease without esophagitis: Secondary | ICD-10-CM | POA: Diagnosis present

## 2019-08-01 DIAGNOSIS — Z90710 Acquired absence of both cervix and uterus: Secondary | ICD-10-CM

## 2019-08-01 DIAGNOSIS — Z882 Allergy status to sulfonamides status: Secondary | ICD-10-CM

## 2019-08-01 DIAGNOSIS — Z881 Allergy status to other antibiotic agents status: Secondary | ICD-10-CM

## 2019-08-01 DIAGNOSIS — M797 Fibromyalgia: Secondary | ICD-10-CM | POA: Diagnosis present

## 2019-08-01 DIAGNOSIS — I129 Hypertensive chronic kidney disease with stage 1 through stage 4 chronic kidney disease, or unspecified chronic kidney disease: Secondary | ICD-10-CM | POA: Diagnosis present

## 2019-08-01 DIAGNOSIS — F419 Anxiety disorder, unspecified: Secondary | ICD-10-CM | POA: Diagnosis present

## 2019-08-01 DIAGNOSIS — E11649 Type 2 diabetes mellitus with hypoglycemia without coma: Secondary | ICD-10-CM | POA: Diagnosis not present

## 2019-08-01 DIAGNOSIS — E1169 Type 2 diabetes mellitus with other specified complication: Secondary | ICD-10-CM | POA: Diagnosis present

## 2019-08-01 DIAGNOSIS — F329 Major depressive disorder, single episode, unspecified: Secondary | ICD-10-CM | POA: Diagnosis present

## 2019-08-01 DIAGNOSIS — J302 Other seasonal allergic rhinitis: Secondary | ICD-10-CM | POA: Diagnosis present

## 2019-08-01 DIAGNOSIS — M167 Other unilateral secondary osteoarthritis of hip: Secondary | ICD-10-CM | POA: Diagnosis not present

## 2019-08-01 DIAGNOSIS — M1611 Unilateral primary osteoarthritis, right hip: Secondary | ICD-10-CM | POA: Diagnosis present

## 2019-08-01 DIAGNOSIS — E1122 Type 2 diabetes mellitus with diabetic chronic kidney disease: Secondary | ICD-10-CM | POA: Diagnosis present

## 2019-08-01 DIAGNOSIS — Z6829 Body mass index (BMI) 29.0-29.9, adult: Secondary | ICD-10-CM

## 2019-08-01 DIAGNOSIS — E669 Obesity, unspecified: Secondary | ICD-10-CM | POA: Diagnosis present

## 2019-08-01 DIAGNOSIS — I10 Essential (primary) hypertension: Secondary | ICD-10-CM | POA: Diagnosis present

## 2019-08-01 DIAGNOSIS — M169 Osteoarthritis of hip, unspecified: Secondary | ICD-10-CM

## 2019-08-01 DIAGNOSIS — G4730 Sleep apnea, unspecified: Secondary | ICD-10-CM | POA: Diagnosis present

## 2019-08-01 DIAGNOSIS — Z79890 Hormone replacement therapy (postmenopausal): Secondary | ICD-10-CM

## 2019-08-01 DIAGNOSIS — Z96612 Presence of left artificial shoulder joint: Secondary | ICD-10-CM | POA: Diagnosis present

## 2019-08-01 DIAGNOSIS — Z419 Encounter for procedure for purposes other than remedying health state, unspecified: Secondary | ICD-10-CM

## 2019-08-01 DIAGNOSIS — N183 Chronic kidney disease, stage 3 (moderate): Secondary | ICD-10-CM | POA: Diagnosis present

## 2019-08-01 HISTORY — PX: TOTAL HIP ARTHROPLASTY: SHX124

## 2019-08-01 LAB — GLUCOSE, CAPILLARY
Glucose-Capillary: 67 mg/dL — ABNORMAL LOW (ref 70–99)
Glucose-Capillary: 70 mg/dL (ref 70–99)
Glucose-Capillary: 109 mg/dL — ABNORMAL HIGH (ref 70–99)
Glucose-Capillary: 37 mg/dL — CL (ref 70–99)
Glucose-Capillary: 83 mg/dL (ref 70–99)
Glucose-Capillary: 143 mg/dL — ABNORMAL HIGH (ref 70–99)
Glucose-Capillary: 46 mg/dL — ABNORMAL LOW (ref 70–99)
Glucose-Capillary: 84 mg/dL (ref 70–99)
Glucose-Capillary: 83 mg/dL (ref 70–99)

## 2019-08-01 LAB — TYPE AND SCREEN
ABO/RH(D): A POS
Antibody Screen: NEGATIVE

## 2019-08-01 SURGERY — ARTHROPLASTY, HIP, TOTAL, ANTERIOR APPROACH
Anesthesia: Spinal | Site: Hip | Laterality: Right

## 2019-08-01 MED ORDER — RIVAROXABAN 10 MG PO TABS
10.0000 mg | ORAL_TABLET | Freq: Every day | ORAL | Status: DC
  Administered 2019-08-02 – 2019-08-04 (×3): 10 mg via ORAL
  Filled 2019-08-01 (×3): qty 1

## 2019-08-01 MED ORDER — HYDROCODONE-ACETAMINOPHEN 7.5-325 MG PO TABS
1.0000 | ORAL_TABLET | ORAL | Status: DC | PRN
  Administered 2019-08-01 – 2019-08-02 (×5): 1 via ORAL
  Filled 2019-08-01 (×5): qty 1

## 2019-08-01 MED ORDER — METHOCARBAMOL 500 MG IVPB - SIMPLE MED
INTRAVENOUS | Status: AC
  Filled 2019-08-01: qty 50

## 2019-08-01 MED ORDER — EPHEDRINE 5 MG/ML INJ
INTRAVENOUS | Status: AC
  Filled 2019-08-01: qty 10

## 2019-08-01 MED ORDER — DIPHENHYDRAMINE HCL 12.5 MG/5ML PO ELIX: 12.5000 mg | ORAL_SOLUTION | ORAL | Status: DC | PRN

## 2019-08-01 MED ORDER — FENTANYL CITRATE (PF) 100 MCG/2ML IJ SOLN
INTRAMUSCULAR | Status: DC | PRN
  Administered 2019-08-01: 25 ug via INTRAVENOUS

## 2019-08-01 MED ORDER — LACTATED RINGERS IV SOLN
INTRAVENOUS | Status: DC
  Administered 2019-08-01 (×3): via INTRAVENOUS

## 2019-08-01 MED ORDER — CEFAZOLIN SODIUM-DEXTROSE 2-4 GM/100ML-% IV SOLN
2.0000 g | INTRAVENOUS | Status: AC
  Administered 2019-08-01: 2 g via INTRAVENOUS
  Filled 2019-08-01: qty 100

## 2019-08-01 MED ORDER — FENTANYL CITRATE (PF) 100 MCG/2ML IJ SOLN
INTRAMUSCULAR | Status: AC
  Filled 2019-08-01: qty 2

## 2019-08-01 MED ORDER — ONDANSETRON HCL 4 MG/2ML IJ SOLN
INTRAMUSCULAR | Status: DC | PRN
  Administered 2019-08-01: 4 mg via INTRAVENOUS

## 2019-08-01 MED ORDER — LOSARTAN POTASSIUM-HCTZ 100-12.5 MG PO TABS: 1.0000 | ORAL_TABLET | Freq: Every day | ORAL | Status: DC

## 2019-08-01 MED ORDER — BISACODYL 10 MG RE SUPP: 10.0000 mg | Freq: Every day | RECTAL | Status: DC | PRN

## 2019-08-01 MED ORDER — METOCLOPRAMIDE HCL 5 MG/ML IJ SOLN: 5.0000 mg | Freq: Three times a day (TID) | INTRAMUSCULAR | Status: DC | PRN

## 2019-08-01 MED ORDER — FLEET ENEMA 7-19 GM/118ML RE ENEM: 1.0000 | ENEMA | Freq: Once | RECTAL | Status: DC | PRN

## 2019-08-01 MED ORDER — ONDANSETRON HCL 4 MG/2ML IJ SOLN: 4.0000 mg | Freq: Four times a day (QID) | INTRAMUSCULAR | Status: DC | PRN

## 2019-08-01 MED ORDER — EPHEDRINE SULFATE 50 MG/ML IJ SOLN
INTRAMUSCULAR | Status: DC | PRN
  Administered 2019-08-01: 7 mg via INTRAVENOUS

## 2019-08-01 MED ORDER — DOCUSATE SODIUM 100 MG PO CAPS
100.0000 mg | ORAL_CAPSULE | Freq: Two times a day (BID) | ORAL | Status: DC
  Administered 2019-08-02 – 2019-08-04 (×5): 100 mg via ORAL
  Filled 2019-08-01 (×6): qty 1

## 2019-08-01 MED ORDER — 0.9 % SODIUM CHLORIDE (POUR BTL) OPTIME
TOPICAL | Status: DC | PRN
  Administered 2019-08-01: 09:00:00 1000 mL

## 2019-08-01 MED ORDER — DEXTROSE 50 % IV SOLN
INTRAVENOUS | Status: AC
  Filled 2019-08-01: qty 50

## 2019-08-01 MED ORDER — DEXTROSE 50 % IV SOLN
25.0000 mL | Freq: Once | INTRAVENOUS | Status: AC
  Administered 2019-08-01: 25 mL via INTRAVENOUS

## 2019-08-01 MED ORDER — DEXTROSE 50 % IV SOLN
INTRAVENOUS | Status: AC
  Administered 2019-08-01: 25 g via INTRAVENOUS
  Filled 2019-08-01: qty 50

## 2019-08-01 MED ORDER — DEXTROSE 50 % IV SOLN
25.0000 g | Freq: Once | INTRAVENOUS | Status: AC
  Administered 2019-08-01: 25 g via INTRAVENOUS

## 2019-08-01 MED ORDER — ACETAMINOPHEN 325 MG PO TABS
325.0000 mg | ORAL_TABLET | Freq: Four times a day (QID) | ORAL | Status: DC | PRN
  Administered 2019-08-03 – 2019-08-04 (×4): 650 mg via ORAL
  Filled 2019-08-01 (×4): qty 2

## 2019-08-01 MED ORDER — SODIUM CHLORIDE 0.9 % IV SOLN
INTRAVENOUS | Status: DC
  Administered 2019-08-01 – 2019-08-02 (×2): via INTRAVENOUS

## 2019-08-01 MED ORDER — POVIDONE-IODINE 10 % EX SWAB
2.0000 "application " | Freq: Once | CUTANEOUS | Status: AC
  Administered 2019-08-01: 2 via TOPICAL

## 2019-08-01 MED ORDER — LIDOCAINE HCL (CARDIAC) PF 100 MG/5ML IV SOSY
PREFILLED_SYRINGE | INTRAVENOUS | Status: DC | PRN
  Administered 2019-08-01: 40 mg via INTRAVENOUS

## 2019-08-01 MED ORDER — BUPIVACAINE IN DEXTROSE 0.75-8.25 % IT SOLN
INTRATHECAL | Status: DC | PRN
  Administered 2019-08-01: 14 mg via INTRATHECAL

## 2019-08-01 MED ORDER — PHENOL 1.4 % MT LIQD: 1.0000 | OROMUCOSAL | Status: DC | PRN

## 2019-08-01 MED ORDER — FENTANYL CITRATE (PF) 100 MCG/2ML IJ SOLN
25.0000 ug | INTRAMUSCULAR | Status: DC | PRN
  Administered 2019-08-01 (×2): 50 ug via INTRAVENOUS

## 2019-08-01 MED ORDER — PROPOFOL 500 MG/50ML IV EMUL
INTRAVENOUS | Status: DC | PRN
  Administered 2019-08-01 (×2): 10 mg via INTRAVENOUS

## 2019-08-01 MED ORDER — METHOCARBAMOL 500 MG IVPB - SIMPLE MED
500.0000 mg | Freq: Four times a day (QID) | INTRAVENOUS | Status: DC | PRN
  Administered 2019-08-01: 500 mg via INTRAVENOUS
  Filled 2019-08-01: qty 50

## 2019-08-01 MED ORDER — METHOCARBAMOL 500 MG PO TABS
500.0000 mg | ORAL_TABLET | Freq: Four times a day (QID) | ORAL | Status: DC | PRN
  Administered 2019-08-02 – 2019-08-04 (×4): 500 mg via ORAL
  Filled 2019-08-01 (×4): qty 1

## 2019-08-01 MED ORDER — INSULIN DETEMIR 100 UNIT/ML ~~LOC~~ SOLN
70.0000 [IU] | Freq: Every day | SUBCUTANEOUS | Status: DC
  Administered 2019-08-02: 08:00:00 70 [IU] via SUBCUTANEOUS
  Filled 2019-08-01 (×2): qty 0.7

## 2019-08-01 MED ORDER — ACETAMINOPHEN 500 MG PO TABS
500.0000 mg | ORAL_TABLET | Freq: Four times a day (QID) | ORAL | Status: AC
  Administered 2019-08-01 – 2019-08-02 (×3): 500 mg via ORAL
  Filled 2019-08-01 (×3): qty 1

## 2019-08-01 MED ORDER — LOSARTAN POTASSIUM 50 MG PO TABS
100.0000 mg | ORAL_TABLET | Freq: Every day | ORAL | Status: DC
  Administered 2019-08-04: 10:00:00 100 mg via ORAL
  Filled 2019-08-01 (×2): qty 2

## 2019-08-01 MED ORDER — INSULIN ASPART 100 UNIT/ML ~~LOC~~ SOLN
0.0000 [IU] | Freq: Three times a day (TID) | SUBCUTANEOUS | Status: DC
  Administered 2019-08-02 (×2): 3 [IU] via SUBCUTANEOUS

## 2019-08-01 MED ORDER — MIDAZOLAM HCL 2 MG/2ML IJ SOLN
INTRAMUSCULAR | Status: AC
  Filled 2019-08-01: qty 2

## 2019-08-01 MED ORDER — LORAZEPAM 0.5 MG PO TABS: 0.5000 mg | ORAL_TABLET | Freq: Every day | ORAL | Status: DC | PRN

## 2019-08-01 MED ORDER — MONTELUKAST SODIUM 10 MG PO TABS
10.0000 mg | ORAL_TABLET | Freq: Every day | ORAL | Status: DC
  Administered 2019-08-02 – 2019-08-04 (×3): 10 mg via ORAL
  Filled 2019-08-01 (×3): qty 1

## 2019-08-01 MED ORDER — BUPIVACAINE HCL (PF) 0.25 % IJ SOLN
INTRAMUSCULAR | Status: DC | PRN
  Administered 2019-08-01: 30 mL

## 2019-08-01 MED ORDER — MENTHOL 3 MG MT LOZG: 1.0000 | LOZENGE | OROMUCOSAL | Status: DC | PRN

## 2019-08-01 MED ORDER — DEXAMETHASONE SODIUM PHOSPHATE 10 MG/ML IJ SOLN: 8.0000 mg | Freq: Once | INTRAMUSCULAR | Status: DC

## 2019-08-01 MED ORDER — ONDANSETRON HCL 4 MG/2ML IJ SOLN
INTRAMUSCULAR | Status: AC
  Filled 2019-08-01: qty 2

## 2019-08-01 MED ORDER — HYDROCHLOROTHIAZIDE 12.5 MG PO CAPS
12.5000 mg | ORAL_CAPSULE | Freq: Every day | ORAL | Status: DC
  Administered 2019-08-02 – 2019-08-04 (×2): 12.5 mg via ORAL
  Filled 2019-08-01 (×3): qty 1

## 2019-08-01 MED ORDER — LIDOCAINE 2% (20 MG/ML) 5 ML SYRINGE
INTRAMUSCULAR | Status: AC
  Filled 2019-08-01: qty 5

## 2019-08-01 MED ORDER — ACETAMINOPHEN 10 MG/ML IV SOLN
1000.0000 mg | Freq: Four times a day (QID) | INTRAVENOUS | Status: DC
  Administered 2019-08-01: 1000 mg via INTRAVENOUS
  Filled 2019-08-01: qty 100

## 2019-08-01 MED ORDER — STERILE WATER FOR IRRIGATION IR SOLN
Status: DC | PRN
  Administered 2019-08-01: 2000 mL

## 2019-08-01 MED ORDER — PROPOFOL 10 MG/ML IV BOLUS
INTRAVENOUS | Status: AC
  Filled 2019-08-01: qty 40

## 2019-08-01 MED ORDER — METOPROLOL TARTRATE 50 MG PO TABS
50.0000 mg | ORAL_TABLET | Freq: Every day | ORAL | Status: DC
  Administered 2019-08-02 – 2019-08-04 (×3): 50 mg via ORAL
  Filled 2019-08-01 (×4): qty 1

## 2019-08-01 MED ORDER — DULOXETINE HCL 60 MG PO CPEP
60.0000 mg | ORAL_CAPSULE | Freq: Two times a day (BID) | ORAL | Status: DC
  Administered 2019-08-01 – 2019-08-04 (×6): 60 mg via ORAL
  Filled 2019-08-01 (×6): qty 1

## 2019-08-01 MED ORDER — METOPROLOL TARTRATE 50 MG PO TABS
50.0000 mg | ORAL_TABLET | Freq: Once | ORAL | Status: AC
  Administered 2019-08-01: 07:00:00 50 mg via ORAL
  Filled 2019-08-01: qty 1

## 2019-08-01 MED ORDER — LEVOTHYROXINE SODIUM 100 MCG PO TABS
100.0000 ug | ORAL_TABLET | Freq: Every day | ORAL | Status: DC
  Administered 2019-08-02 – 2019-08-04 (×3): 100 ug via ORAL
  Filled 2019-08-01 (×3): qty 1

## 2019-08-01 MED ORDER — TRANEXAMIC ACID 1000 MG/10ML IV SOLN
2000.0000 mg | Freq: Once | INTRAVENOUS | Status: AC
  Administered 2019-08-01: 2000 mg via TOPICAL
  Filled 2019-08-01: qty 20

## 2019-08-01 MED ORDER — MIDAZOLAM HCL 5 MG/5ML IJ SOLN
INTRAMUSCULAR | Status: DC | PRN
  Administered 2019-08-01 (×2): 0.5 mg via INTRAVENOUS

## 2019-08-01 MED ORDER — CHLORHEXIDINE GLUCONATE 4 % EX LIQD: 60.0000 mL | Freq: Once | CUTANEOUS | Status: DC

## 2019-08-01 MED ORDER — ONDANSETRON HCL 4 MG PO TABS: 4.0000 mg | ORAL_TABLET | Freq: Four times a day (QID) | ORAL | Status: DC | PRN

## 2019-08-01 MED ORDER — METOCLOPRAMIDE HCL 5 MG PO TABS: 5.0000 mg | ORAL_TABLET | Freq: Three times a day (TID) | ORAL | Status: DC | PRN

## 2019-08-01 MED ORDER — BUPIVACAINE HCL (PF) 0.25 % IJ SOLN
INTRAMUSCULAR | Status: AC
  Filled 2019-08-01: qty 30

## 2019-08-01 MED ORDER — MORPHINE SULFATE (PF) 4 MG/ML IV SOLN
0.5000 mg | INTRAVENOUS | Status: DC | PRN
  Administered 2019-08-01 (×2): 0.52 mg via INTRAVENOUS
  Administered 2019-08-02: 1 mg via INTRAVENOUS
  Filled 2019-08-01 (×3): qty 1

## 2019-08-01 MED ORDER — HYDROCODONE-ACETAMINOPHEN 5-325 MG PO TABS
1.0000 | ORAL_TABLET | ORAL | Status: DC | PRN
  Administered 2019-08-01 – 2019-08-02 (×2): 1 via ORAL
  Filled 2019-08-01 (×2): qty 1

## 2019-08-01 MED ORDER — TRANEXAMIC ACID-NACL 1000-0.7 MG/100ML-% IV SOLN
1000.0000 mg | INTRAVENOUS | Status: DC
  Filled 2019-08-01: qty 100

## 2019-08-01 MED ORDER — MINOCYCLINE HCL 100 MG PO CAPS
100.0000 mg | ORAL_CAPSULE | Freq: Every day | ORAL | Status: DC
  Administered 2019-08-02 – 2019-08-04 (×3): 100 mg via ORAL
  Filled 2019-08-01 (×3): qty 1

## 2019-08-01 MED ORDER — ALBUTEROL SULFATE (2.5 MG/3ML) 0.083% IN NEBU: 2.5000 mg | INHALATION_SOLUTION | Freq: Four times a day (QID) | RESPIRATORY_TRACT | Status: DC | PRN

## 2019-08-01 MED ORDER — PROPOFOL 500 MG/50ML IV EMUL
INTRAVENOUS | Status: DC | PRN
  Administered 2019-08-01: 50 ug/kg/min via INTRAVENOUS

## 2019-08-01 MED ORDER — CEFAZOLIN SODIUM-DEXTROSE 2-4 GM/100ML-% IV SOLN
2.0000 g | Freq: Four times a day (QID) | INTRAVENOUS | Status: AC
  Administered 2019-08-01 (×2): 2 g via INTRAVENOUS
  Filled 2019-08-01 (×2): qty 100

## 2019-08-01 MED ORDER — ALBUMIN HUMAN 5 % IV SOLN
INTRAVENOUS | Status: AC
  Filled 2019-08-01: qty 250

## 2019-08-01 MED ORDER — POLYETHYLENE GLYCOL 3350 17 G PO PACK: 17.0000 g | PACK | Freq: Every day | ORAL | Status: DC | PRN

## 2019-08-01 MED ORDER — ALBUMIN HUMAN 5 % IV SOLN
INTRAVENOUS | Status: DC | PRN
  Administered 2019-08-01: 10:00:00 via INTRAVENOUS

## 2019-08-01 SURGICAL SUPPLY — 45 items
BAG DECANTER FOR FLEXI CONT (MISCELLANEOUS) ×3 IMPLANT
BAG ZIPLOCK 12X15 (MISCELLANEOUS) IMPLANT
BLADE SAG 18X100X1.27 (BLADE) ×3 IMPLANT
CLOSURE WOUND 1/2 X4 (GAUZE/BANDAGES/DRESSINGS) ×2
COVER PERINEAL POST (MISCELLANEOUS) ×3 IMPLANT
COVER SURGICAL LIGHT HANDLE (MISCELLANEOUS) ×3 IMPLANT
COVER WAND RF STERILE (DRAPES) IMPLANT
CUP ACETBLR 52 OD PINNACLE (Hips) ×3 IMPLANT
DECANTER SPIKE VIAL GLASS SM (MISCELLANEOUS) ×3 IMPLANT
DRAPE STERI IOBAN 125X83 (DRAPES) ×3 IMPLANT
DRAPE U-SHAPE 47X51 STRL (DRAPES) ×6 IMPLANT
DRSG ADAPTIC 3X8 NADH LF (GAUZE/BANDAGES/DRESSINGS) ×3 IMPLANT
DRSG MEPILEX BORDER 4X4 (GAUZE/BANDAGES/DRESSINGS) ×3 IMPLANT
DRSG MEPILEX BORDER 4X8 (GAUZE/BANDAGES/DRESSINGS) ×3 IMPLANT
DURAPREP 26ML APPLICATOR (WOUND CARE) ×3 IMPLANT
ELECT REM PT RETURN 15FT ADLT (MISCELLANEOUS) ×3 IMPLANT
EVACUATOR 1/8 PVC DRAIN (DRAIN) ×3 IMPLANT
GLOVE BIO SURGEON STRL SZ 6 (GLOVE) IMPLANT
GLOVE BIO SURGEON STRL SZ7 (GLOVE) IMPLANT
GLOVE BIO SURGEON STRL SZ8 (GLOVE) ×3 IMPLANT
GLOVE BIOGEL PI IND STRL 6.5 (GLOVE) IMPLANT
GLOVE BIOGEL PI IND STRL 7.0 (GLOVE) IMPLANT
GLOVE BIOGEL PI IND STRL 8 (GLOVE) ×1 IMPLANT
GLOVE BIOGEL PI INDICATOR 6.5 (GLOVE)
GLOVE BIOGEL PI INDICATOR 7.0 (GLOVE)
GLOVE BIOGEL PI INDICATOR 8 (GLOVE) ×2
GOWN STRL REUS W/TWL LRG LVL3 (GOWN DISPOSABLE) ×3 IMPLANT
GOWN STRL REUS W/TWL XL LVL3 (GOWN DISPOSABLE) IMPLANT
HEAD FEM STD 32X+5 STRL (Hips) ×3 IMPLANT
HOLDER FOLEY CATH W/STRAP (MISCELLANEOUS) ×3 IMPLANT
KIT TURNOVER KIT A (KITS) IMPLANT
LINER MARATHON NEUT +4X52X32 (Hips) ×3 IMPLANT
MANIFOLD NEPTUNE II (INSTRUMENTS) ×3 IMPLANT
PACK ANTERIOR HIP CUSTOM (KITS) ×3 IMPLANT
STEM FEM ACTIS STD SZ4 (Stem) ×3 IMPLANT
STRIP CLOSURE SKIN 1/2X4 (GAUZE/BANDAGES/DRESSINGS) ×4 IMPLANT
SUT ETHIBOND NAB CT1 #1 30IN (SUTURE) ×3 IMPLANT
SUT MNCRL AB 4-0 PS2 18 (SUTURE) ×3 IMPLANT
SUT STRATAFIX 0 PDS 27 VIOLET (SUTURE) ×3
SUT VIC AB 2-0 CT1 27 (SUTURE) ×4
SUT VIC AB 2-0 CT1 TAPERPNT 27 (SUTURE) ×2 IMPLANT
SUTURE STRATFX 0 PDS 27 VIOLET (SUTURE) ×1 IMPLANT
SYR 50ML LL SCALE MARK (SYRINGE) ×3 IMPLANT
TRAY FOLEY MTR SLVR 14FR STAT (SET/KITS/TRAYS/PACK) ×3 IMPLANT
YANKAUER SUCT BULB TIP 10FT TU (MISCELLANEOUS) ×3 IMPLANT

## 2019-08-01 NOTE — Transfer of Care (Signed)
Immediate Anesthesia Transfer of Care Note  Patient: Angelica Wood  Procedure(s) Performed: TOTAL HIP ARTHROPLASTY ANTERIOR APPROACH (Right Hip)  Patient Location: PACU  Anesthesia Type:Spinal  Level of Consciousness: awake, alert , oriented and patient cooperative  Airway & Oxygen Therapy: Patient Spontanous Breathing and Patient connected to face mask oxygen  Post-op Assessment: Report given to RN and Post -op Vital signs reviewed and stable  Post vital signs: Reviewed and stable  Last Vitals:  Vitals Value Taken Time  BP 107/77 08/01/19 1008  Temp    Pulse 51 08/01/19 1013  Resp 12 08/01/19 1013  SpO2 100 % 08/01/19 1013  Vitals shown include unvalidated device data.  Last Pain:  Vitals:   08/01/19 0651  TempSrc: Oral  PainSc:       Patients Stated Pain Goal: 4 (97/84/78 4128)  Complications: No apparent anesthesia complications

## 2019-08-01 NOTE — Progress Notes (Signed)
Hypoglycemic Event  CBG: 46  Treatment: 4 oz juice eating dinner tray  Symptoms: none  Follow-up CBG: Time1650 CBG Result 84  Possible Reasons for Event: surgery   Comments/MD notified: Dr Wynelle Link made aware    Butler [place]

## 2019-08-01 NOTE — Op Note (Signed)
OPERATIVE REPORT- TOTAL HIP ARTHROPLASTY   PREOPERATIVE DIAGNOSIS: Osteoarthritis of the Right hip.   POSTOPERATIVE DIAGNOSIS: Osteoarthritis of the Right  hip.   PROCEDURE: Right total hip arthroplasty, anterior approach.   SURGEON: Gaynelle Arabian, MD   ASSISTANT: Griffith Citron, PA-C  ANESTHESIA:  Spinal  ESTIMATED BLOOD LOSS:-150 mL    DRAINS: Hemovac x1.   COMPLICATIONS: None   CONDITION: PACU - hemodynamically stable.   BRIEF CLINICAL NOTE: Angelica Wood is a 83 y.o. female who has advanced end-  stage arthritis of their Right  hip with progressively worsening pain and  dysfunction.The patient has failed nonoperative management and presents for  total hip arthroplasty.   PROCEDURE IN DETAIL: After successful administration of spinal  anesthetic, the traction boots for the [facility] bed were placed on both  feet and the patient was placed onto the [facility] bed, boots placed into the leg  holders. The Right hip was then isolated from the perineum with plastic  drapes and prepped and draped in the usual sterile fashion. ASIS and  greater trochanter were marked and a oblique incision was made, starting  at about 1 cm lateral and 2 cm distal to the ASIS and coursing towards  the anterior cortex of the femur. The skin was cut with a 10 blade  through subcutaneous tissue to the level of the fascia overlying the  tensor fascia lata muscle. The fascia was then incised in line with the  incision at the junction of the anterior third and posterior 2/3rd. The  muscle was teased off the fascia and then the interval between the TFL  and the rectus was developed. The Hohmann retractor was then placed at  the top of the femoral neck over the capsule. The vessels overlying the  capsule were cauterized and the fat on top of the capsule was removed.  A Hohmann retractor was then placed anterior underneath the rectus  femoris to give exposure to the entire anterior capsule. A T-shaped   capsulotomy was performed. The edges were tagged and the femoral head  was identified.       Osteophytes are removed off the superior acetabulum.  The femoral neck was then cut in situ with an oscillating saw. Traction  was then applied to the left lower extremity utilizing the [facility]  traction. The femoral head was then removed. Retractors were placed  around the acetabulum and then circumferential removal of the labrum was  performed. Osteophytes were also removed. Reaming starts at 47 mm to  medialize and  Increased in 2 mm increments to 51 mm. We reamed in  approximately 40 degrees of abduction, 20 degrees anteversion. A 52 mm  pinnacle acetabular shell was then impacted in anatomic position under  fluoroscopic guidance with excellent purchase. We did not need to place  any additional dome screws. A 32 mm neutral + 4 marathon liner was then  placed into the acetabular shell.       The femoral lift was then placed along the lateral aspect of the femur  just distal to the vastus ridge. The leg was  externally rotated and capsule  was stripped off the inferior aspect of the femoral neck down to the  level of the lesser trochanter, this was done with electrocautery. The femur was lifted after this was performed. The  leg was then placed in an extended and adducted position essentially delivering the femur. We also removed the capsule superiorly and the piriformis from the piriformis  fossa to gain excellent exposure of the  proximal femur. Rongeur was used to remove some cancellous bone to get  into the lateral portion of the proximal femur for placement of the  initial starter reamer. The starter broaches was placed  the starter broach  and was shown to go down the center of the canal. Broaching  with the Actis system was then performed starting at size 0  coursing  Up to size 4. A size 4 had excellent torsional and rotational  and axial stability. The trial standard offset neck was then  placed  with a 32 + 5 trial head. The hip was then reduced. We confirmed that  the stem was in the canal both on AP and lateral x-rays. It also has excellent sizing. The hip was reduced with outstanding stability through full extension and full external rotation.. AP pelvis was taken and the leg lengths were measured and found to be equal. Hip was then dislocated again and the femoral head and neck removed. The  femoral broach was removed. Size 4 Actis stem with a standard offset  neck was then impacted into the femur following native anteversion. Has  excellent purchase in the canal. Excellent torsional and rotational and  axial stability. It is confirmed to be in the canal on AP and lateral  fluoroscopic views. The 32 + 5 metal head was placed and the hip  reduced with outstanding stability. Again AP pelvis was taken and it  confirmed that the leg lengths were equal. The wound was then copiously  irrigated with saline solution and the capsule reattached and repaired  with Ethibond suture. 30 ml of .25% Bupivicaine was  injected into the capsule and into the edge of the tensor fascia lata as well as subcutaneous tissue. The fascia overlying the tensor fascia lata was then closed with a running #1 V-Loc. Subcu was closed with interrupted 2-0 Vicryl and subcuticular running 4-0 Monocryl. Incision was cleaned  and dried. Steri-Strips and a bulky sterile dressing applied. Hemovac  drain was hooked to suction and then the patient was awakened and transported to  recovery in stable condition.        Please note that a surgical assistant was a medical necessity for this procedure to perform it in a safe and expeditious manner. Assistant was necessary to provide appropriate retraction of vital neurovascular structures and to prevent femoral fracture and allow for anatomic placement of the prosthesis.  Gaynelle Arabian, M.D.

## 2019-08-01 NOTE — Plan of Care (Signed)
Plan of care 

## 2019-08-01 NOTE — Discharge Instructions (Addendum)
1)Reduce Levemir insulin to only 10 units at bedtime starting on Sunday, 08/05/2019 (please do not take the 65 units you were  taking previously as your blood sugars will get too low) 2) take NovoLog insulin 3 units  with meals 3 times a day 3) please follow-up with your primary care physician for further adjustment of your insulin regimen 4) please do not restart Ozempic   Dr. Gaynelle Arabian Total Joint Specialist [facility] [address] [place],  [address] [phone]  ANTERIOR APPROACH TOTAL HIP REPLACEMENT POSTOPERATIVE DIRECTIONS   Hip Rehabilitation, Guidelines Following Surgery  The results of a hip operation are greatly improved after range of motion and muscle strengthening exercises. Follow all safety measures which are given to protect your hip. If any of these exercises cause increased pain or swelling in your joint, decrease the amount until you are comfortable again. Then slowly increase the exercises. Call your caregiver if you have problems or questions.   HOME CARE INSTRUCTIONS   Remove items at home which could result in a fall. This includes throw rugs or furniture in walking pathways.   ICE to the affected hip every three hours for 30 minutes at a time and then as needed for pain and swelling.  Continue to use ice on the hip for pain and swelling from surgery. You may notice swelling that will progress down to the foot and ankle.  This is normal after surgery.  Elevate the leg when you are not up walking on it.    Continue to use the breathing machine which will help keep your temperature down.  It is common for your temperature to cycle up and down following surgery, especially at night when you are not up moving around and exerting yourself.  The breathing machine keeps your lungs expanded and your temperature down.  DIET You may resume your previous home diet once your are discharged from the hospital.  DRESSING / WOUND CARE /  SHOWERING You may change your dressing 3-5 days after surgery.  Then change the dressing every day with sterile gauze.  Please use good hand washing techniques before changing the dressing.  Do not use any lotions or creams on the incision until instructed by your surgeon. You may start showering once you are discharged home but do not submerge the incision under water. Just pat the incision dry and apply a dry gauze dressing on daily. Change the surgical dressing daily and reapply a dry dressing each time.  ACTIVITY Walk with your walker as instructed. Use walker as long as suggested by your caregivers. Avoid periods of inactivity such as sitting longer than an hour when not asleep. This helps prevent blood clots.  You may resume a sexual relationship in one month or when given the OK by your doctor.  You may return to work once you are cleared by your doctor.  Do not drive a car for 6 weeks or until released by you surgeon.  Do not drive while taking narcotics.  WEIGHT BEARING Weight bearing as tolerated with assist device (walker, cane, etc) as directed, use it as long as suggested by your surgeon or therapist, typically at least 4-6 weeks.  POSTOPERATIVE CONSTIPATION PROTOCOL Constipation - defined medically as fewer than three stools per week and severe constipation as less than one stool per week.  One of the most common issues patients have following surgery is constipation.  Even if you have a regular bowel pattern at home, your normal regimen  is likely to be disrupted due to multiple reasons following surgery.  Combination of anesthesia, postoperative narcotics, change in appetite and fluid intake all can affect your bowels.  In order to avoid complications following surgery, here are some recommendations in order to help you during your recovery period.  Colace (docusate) - Pick up an over-the-counter form of Colace or another stool softener and take twice a day as long as you are  requiring postoperative pain medications.  Take with a full glass of water daily.  If you experience loose stools or diarrhea, hold the colace until you stool forms back up.  If your symptoms do not get better within 1 week or if they get worse, check with your doctor.  Dulcolax (bisacodyl) - Pick up over-the-counter and take as directed by the product packaging as needed to assist with the movement of your bowels.  Take with a full glass of water.  Use this product as needed if not relieved by Colace only.   MiraLax (polyethylene glycol) - Pick up over-the-counter to have on hand.  MiraLax is a solution that will increase the amount of water in your bowels to assist with bowel movements.  Take as directed and can mix with a glass of water, juice, soda, coffee, or tea.  Take if you go more than two days without a movement. Do not use MiraLax more than once per day. Call your doctor if you are still constipated or irregular after using this medication for 7 days in a row.  If you continue to have problems with postoperative constipation, please contact the office for further assistance and recommendations.  If you experience "the worst abdominal pain ever" or develop nausea or vomiting, please contact the office immediatly for further recommendations for treatment.  ITCHING  If you experience itching with your medications, try taking only a single pain pill, or even half a pain pill at a time.  You can also use Benadryl over the counter for itching or also to help with sleep.   TED HOSE STOCKINGS Wear the elastic stockings on both legs for three weeks following surgery during the day but you may remove then at night for sleeping.  MEDICATIONS See your medication summary on the After Visit Summary that the nursing staff will review with you prior to discharge.  You may have some home medications which will be placed on hold until you complete the course of blood thinner medication.  It is important  for you to complete the blood thinner medication as prescribed by your surgeon.  Continue your approved medications as instructed at time of discharge.  PRECAUTIONS If you experience chest pain or shortness of breath - call 911 immediately for transfer to the hospital emergency department.  If you develop a fever greater that 101 F, purulent drainage from wound, increased redness or drainage from wound, foul odor from the wound/dressing, or calf pain - CONTACT YOUR SURGEON.                                                   FOLLOW-UP APPOINTMENTS Make sure you keep all of your appointments after your operation with your surgeon and caregivers. You should call the office at the above phone number and make an appointment for approximately two weeks after the date of your surgery or on the date instructed  by your surgeon outlined in the "After Visit Summary".  RANGE OF MOTION AND STRENGTHENING EXERCISES  These exercises are designed to help you keep full movement of your hip joint. Follow your caregiver's or physical therapist's instructions. Perform all exercises about fifteen times, three times per day or as directed. Exercise both hips, even if you have had only one joint replacement. These exercises can be done on a training (exercise) mat, on the floor, on a table or on a bed. Use whatever works the best and is most comfortable for you. Use music or television while you are exercising so that the exercises are a pleasant break in your day. This will make your life better with the exercises acting as a break in routine you can look forward to.   Lying on your back, slowly slide your foot toward your buttocks, raising your knee up off the floor. Then slowly slide your foot back down until your leg is straight again.   Lying on your back spread your legs as far apart as you can without causing discomfort.   Lying on your side, raise your upper leg and foot straight up from the floor as far as is  comfortable. Slowly lower the leg and repeat.   Lying on your back, tighten up the muscle in the front of your thigh (quadriceps muscles). You can do this by keeping your leg straight and trying to raise your heel off the floor. This helps strengthen the largest muscle supporting your knee.   Lying on your back, tighten up the muscles of your buttocks both with the legs straight and with the knee bent at a comfortable angle while keeping your heel on the floor.   IF YOU ARE TRANSFERRED TO A SKILLED REHAB FACILITY If the patient is transferred to a skilled rehab facility following release from the hospital, a list of the current medications will be sent to the facility for the patient to continue.  When discharged from the skilled rehab facility, please have the facility set up the patient's [place] prior to being released. Also, the skilled facility will be responsible for providing the patient with their medications at time of release from the facility to include their pain medication, the muscle relaxants, and their blood thinner medication. If the patient is still at the rehab facility at time of the two week follow up appointment, the skilled rehab facility will also need to assist the patient in arranging follow up appointment in our office and any transportation needs.  MAKE SURE YOU:   Understand these instructions.   Get help right away if you are not doing well or get worse.    Pick up stool softner and laxative for home use following surgery while on pain medications. Do not submerge incision under water. Please use good hand washing techniques while changing dressing each day. May shower starting three days after surgery. Please use a clean towel to pat the incision dry following showers. Continue to use ice for pain and swelling after surgery. Do not use any lotions or creams on the incision until instructed by your surgeon.   Information on my medicine -  XARELTO (Rivaroxaban)  This medication education was reviewed with me or my healthcare representative as part of my discharge preparation.   Why was Xarelto prescribed for you? Xarelto was prescribed for you to reduce the risk of blood clots forming after orthopedic surgery. The medical term for these abnormal blood clots is venous thromboembolism (  VTE).  What do you need to know about xarelto ? Take your Xarelto ONCE DAILY at the same time every day. You may take it either with or without food.  If you have difficulty swallowing the tablet whole, you may crush it and mix in applesauce just prior to taking your dose.  Take Xarelto exactly as prescribed by your doctor and DO NOT stop taking Xarelto without talking to the doctor who prescribed the medication.  Stopping without other VTE prevention medication to take the place of Xarelto may increase your risk of developing a clot.  After discharge, you should have regular check-up appointments with your healthcare provider that is prescribing your Xarelto.    What do you do if you miss a dose? If you miss a dose, take it as soon as you remember on the same day then continue your regularly scheduled once daily regimen the next day. Do not take two doses of Xarelto on the same day.   Important Safety Information A possible side effect of Xarelto is bleeding. You should call your healthcare provider right away if you experience any of the following: ? Bleeding from an injury or your nose that does not stop. ? Unusual colored urine (red or dark brown) or unusual colored stools (red or black). ? Unusual bruising for unknown reasons. ? A serious fall or if you hit your head (even if there is no bleeding).  Some medicines may interact with Xarelto and might increase your risk of bleeding while on Xarelto. To help avoid this, consult your healthcare provider or pharmacist prior to using any new prescription or non-prescription  medications, including herbals, vitamins, non-steroidal anti-inflammatory drugs (NSAIDs) and supplements.  This website has more information on Xarelto: [email or web address]

## 2019-08-01 NOTE — Evaluation (Signed)
Physical Therapy Evaluation Patient Details Name: Angelica Wood MRN: [id number] DOB: 09-Dec-1936 Today's Date: 08/01/2019   History of Present Illness  83 y.o. female admitted for R DA-THA. PMH: fibromyalgia, B TSA, DM, HTN  Clinical Impression  Pt is s/p THA resulting in the deficits listed below (see PT Problem List). Pt unable to tolerate minimal movement of R hip 2* severe pain. Attempted supine to sit and RLE AAROM, pt unable to tolerate either. She was in tears and reported 10/10 pain.  Pt had pain medication prior to PT session.  Pt will benefit from skilled PT to increase their independence and safety with mobility to allow discharge to the venue listed below.      Follow Up Recommendations Follow surgeon's recommendation for DC plan and follow-up therapies    Equipment Recommendations  Rolling walker with 5" wheels;3in1 (PT)    Recommendations for Other Services       Precautions / Restrictions Precautions Precautions: Fall Restrictions Weight Bearing Restrictions: No Other Position/Activity Restrictions: WBAT      Mobility  Bed Mobility Overal bed mobility: Needs Assistance             General bed mobility comments: attempted supine to sit but pt had excruciating pain with minimal movement of RLE towards edge of bed, +2 total assist to scoot up in bed  Transfers                    Ambulation/Gait                Stairs            Wheelchair Mobility    Modified Rankin (Stroke Patients Only)       Balance                                             Pertinent Vitals/Pain Pain Assessment: 0-10 Pain Score: 10-Worst pain ever Pain Location: R hip with minimal movement Pain Descriptors / Indicators: Guarding;Grimacing;Crying Pain Intervention(s): Limited activity within patient's tolerance;Monitored during session;Premedicated before session(refused ice)    Home Living Family/patient expects to be discharged  to:: Private residence Living Arrangements: Other relatives(sister Everlene Farrier will stay with pt) Available Help at Discharge: Family;Available 24 hours/day   Home Access: Stairs to enter Entrance Stairs-Rails: None Entrance Stairs-Number of Steps: 3   Home Equipment: Walker - standard      Prior Function Level of Independence: Independent with assistive device(s)         Comments: used SW     Hand Dominance        Extremity/Trunk Assessment   Upper Extremity Assessment Upper Extremity Assessment: Overall WFL for tasks assessed    Lower Extremity Assessment Lower Extremity Assessment: RLE deficits/detail RLE: Unable to fully assess due to pain RLE Sensation: WNL       Communication   Communication: No difficulties  Cognition Arousal/Alertness: Lethargic;Suspect due to medications Behavior During Therapy: [facility] for tasks assessed/performed Overall Cognitive Status: Within Functional Limits for tasks assessed                                 General Comments: pt able to answer questions and follow commands but fell asleep when not being spoken to      General Comments      Exercises  Total Joint Exercises Heel Slides: PROM;Right(3 reps, pt unable to tolerate more 2* pain)   Assessment/Plan    PT Assessment Patient needs continued PT services  PT Problem List Decreased strength;Decreased range of motion;Decreased activity tolerance;Decreased mobility;Decreased knowledge of use of DME;Pain       PT Treatment Interventions DME instruction;Gait training;Stair training;Therapeutic exercise;Therapeutic activities;Patient/family education;Functional mobility training    PT Goals (Current goals can be found in the Care Plan section)  Acute Rehab PT Goals Patient Stated Goal: decrease pain PT Goal Formulation: With patient Time For Goal Achievement: 08/08/19 Potential to Achieve Goals: Fair    Frequency 7X/week   Barriers to discharge         Co-evaluation               AM-PAC PT "6 Clicks" Mobility  Outcome Measure Help needed turning from your back to your side while in a flat bed without using bedrails?: Total Help needed moving from lying on your back to sitting on the side of a flat bed without using bedrails?: Total Help needed moving to and from a bed to a chair (including a wheelchair)?: Total Help needed standing up from a chair using your arms (e.g., wheelchair or bedside chair)?: Total Help needed to walk in hospital room?: Total Help needed climbing 3-5 steps with a railing? : Total 6 Click Score: 6    End of Session   Activity Tolerance: Patient limited by pain Patient left: in bed;with call bell/phone within reach Nurse Communication: Mobility status PT Visit Diagnosis: Muscle weakness (generalized) (M62.81);Pain;Difficulty in walking, not elsewhere classified (R26.2) Pain - Right/Left: Right Pain - part of body: Hip    Time: 5409-8119 PT Time Calculation (min) (ACUTE ONLY): 12 min   Charges:   PT Evaluation $PT Eval Low Complexity: 1 Low          Blondell Reveal Kistler PT 08/01/2019  Acute Rehabilitation Services Pager [phone] Office [phone]

## 2019-08-01 NOTE — Anesthesia Postprocedure Evaluation (Signed)
Anesthesia Post Note  Patient: Angelica Wood  Procedure(s) Performed: TOTAL HIP ARTHROPLASTY ANTERIOR APPROACH (Right Hip)     Patient location during evaluation: PACU Anesthesia Type: Spinal Level of consciousness: oriented and awake and alert Pain management: pain level controlled Vital Signs Assessment: post-procedure vital signs reviewed and stable Respiratory status: spontaneous breathing, respiratory function stable and patient connected to nasal cannula oxygen Cardiovascular status: blood pressure returned to baseline and stable Postop Assessment: no headache, no backache and no apparent nausea or vomiting Anesthetic complications: no    Last Vitals:  Vitals:   08/01/19 1218 08/01/19 1333  BP: 138/63 136/66  Pulse: 65 62  Resp: 16 15  Temp: (!) 36.4 C (!) 36.4 C  SpO2: 100% 100%    Last Pain:  Vitals:   08/01/19 1336  TempSrc:   PainSc: 7                  Mayanna Garlitz L [name]

## 2019-08-01 NOTE — Interval H&P Note (Signed)
History and Physical Interval Note:  08/01/2019 8:12 AM  Angelica Wood  has presented today for surgery, with the diagnosis of right hip osteoarthritis.  The various methods of treatment have been discussed with the patient and family. After consideration of risks, benefits and other options for treatment, the patient has consented to  Procedure(s) with comments: [place] (Right) - 139min as a surgical intervention.  The patient's history has been reviewed, patient examined, no change in status, stable for surgery.  I have reviewed the patient's chart and labs.  Questions were answered to the patient's satisfaction.     Pilar Plate [place] Culbreth

## 2019-08-01 NOTE — Anesthesia Procedure Notes (Signed)
Spinal  Patient location during procedure: OR Start time: 08/01/2019 8:33 AM End time: 08/01/2019 8:38 AM Staffing Resident/CRNA: Garrel Ridgel, CRNA Performed: resident/CRNA  Preanesthetic Checklist Completed: patient identified, site marked, surgical consent, pre-op evaluation, timeout performed, IV checked, risks and benefits discussed and monitors and equipment checked Spinal Block Patient position: sitting Prep: Betadine Patient monitoring: heart rate, continuous pulse ox and blood pressure Approach: midline Location: L3-4 Injection technique: single-shot Needle Needle type: Pencan  Needle gauge: 24 G Needle length: 9 cm Needle insertion depth: 4 cm Assessment Sensory level: T8

## 2019-08-02 ENCOUNTER — Encounter (HOSPITAL_COMMUNITY): Payer: Self-pay | Admitting: Orthopedic Surgery

## 2019-08-02 LAB — CBC
HCT: 34 % — ABNORMAL LOW (ref 36.0–46.0)
Hemoglobin: 10.8 g/dL — ABNORMAL LOW (ref 12.0–15.0)
MCH: 29 pg (ref 26.0–34.0)
MCHC: 31.8 g/dL (ref 30.0–36.0)
MCV: 91.2 fL (ref 80.0–100.0)
Platelets: 184 10*3/uL (ref 150–400)
RBC: 3.73 MIL/uL — ABNORMAL LOW (ref 3.87–5.11)
RDW: 13.2 % (ref 11.5–15.5)
WBC: 8 10*3/uL (ref 4.0–10.5)
nRBC: 0 % (ref 0.0–0.2)

## 2019-08-02 LAB — BASIC METABOLIC PANEL
Anion gap: 9 (ref 5–15)
BUN: 14 mg/dL (ref 8–23)
CO2: 25 mmol/L (ref 22–32)
Calcium: 8.1 mg/dL — ABNORMAL LOW (ref 8.9–10.3)
Chloride: 102 mmol/L (ref 98–111)
Creatinine, Ser: 0.99 mg/dL (ref 0.44–1.00)
GFR calc Af Amer: >60 mL/min (ref >60)
GFR calc non Af Amer: 53 mL/min — ABNORMAL LOW (ref >60)
Glucose, Bld: 186 mg/dL — ABNORMAL HIGH (ref 70–99)
Potassium: 4.1 mmol/L (ref 3.5–5.1)
Sodium: 136 mmol/L (ref 135–145)

## 2019-08-02 LAB — GLUCOSE, CAPILLARY
Glucose-Capillary: 141 mg/dL — ABNORMAL HIGH (ref 70–99)
Glucose-Capillary: 165 mg/dL — ABNORMAL HIGH (ref 70–99)
Glucose-Capillary: 157 mg/dL — ABNORMAL HIGH (ref 70–99)
Glucose-Capillary: 68 mg/dL — ABNORMAL LOW (ref 70–99)
Glucose-Capillary: 76 mg/dL (ref 70–99)
Glucose-Capillary: 91 mg/dL (ref 70–99)

## 2019-08-02 LAB — HEMOGLOBIN A1C
Hgb A1c MFr Bld: 6.9 % — ABNORMAL HIGH (ref 4.8–5.6)
Mean Plasma Glucose: 151.33 mg/dL

## 2019-08-02 MED ORDER — HYDROCODONE-ACETAMINOPHEN 5-325 MG PO TABS
1.0000 | ORAL_TABLET | ORAL | Status: DC | PRN
  Administered 2019-08-02: 2 via ORAL
  Administered 2019-08-03 – 2019-08-04 (×2): 1 via ORAL
  Filled 2019-08-02 (×2): qty 2
  Filled 2019-08-02: qty 1
  Filled 2019-08-02: qty 2

## 2019-08-02 MED ORDER — RIVAROXABAN 10 MG PO TABS: 10.0000 mg | ORAL_TABLET | Freq: Every day | ORAL | 0 refills | Status: DC

## 2019-08-02 MED ORDER — HYDROCODONE-ACETAMINOPHEN 5-325 MG PO TABS: 1.0000 | ORAL_TABLET | Freq: Four times a day (QID) | ORAL | 0 refills | Status: DC | PRN

## 2019-08-02 MED ORDER — METHOCARBAMOL 500 MG PO TABS: 500.0000 mg | ORAL_TABLET | Freq: Four times a day (QID) | ORAL | 1 refills | Status: DC | PRN

## 2019-08-02 NOTE — Progress Notes (Addendum)
Physical Therapy Treatment Patient Details Name: Angelica Wood MRN: [id number] DOB: 12-26-1935 Today's Date: 08/02/2019    History of Present Illness 83 y.o. female admitted for R DA-THA. PMH: fibromyalgia, B TSA, DM, HTN    PT Comments    Pt appears cooperative but limited by pain with all attempts to mobilize or initiate therex program. Pt had pre-med.  RN aware and will assess.   Follow Up Recommendations  Follow surgeon's recommendation for DC plan and follow-up therapies     Equipment Recommendations  Rolling walker with 5" wheels;3in1 (PT)    Recommendations for Other Services       Precautions / Restrictions Precautions Precautions: Fall Restrictions Weight Bearing Restrictions: No RLE Weight Bearing: Weight bearing as tolerated    Mobility  Bed Mobility Overal bed mobility: Needs Assistance             General bed mobility comments: attempted supine to sit but pt had excruciating pain with each attempt  Transfers                    Ambulation/Gait                 Stairs             Wheelchair Mobility    Modified Rankin (Stroke Patients Only)       Balance                                            Cognition Arousal/Alertness: Awake/alert Behavior During Therapy: WFL for tasks assessed/performed Overall Cognitive Status: Within Functional Limits for tasks assessed                                        Exercises Total Joint Exercises Ankle Circles/Pumps: AROM;Both;10 reps;Supine Heel Slides: AAROM;Right;Supine;Other reps (comment)(3 reps)    General Comments        Pertinent Vitals/Pain Pain Assessment: 0-10 Pain Score: 10-Worst pain ever Pain Location: R hip with minimal movement Pain Descriptors / Indicators: Guarding;Grimacing Pain Intervention(s): Limited activity within patient's tolerance;Monitored during session;Premedicated before session;Ice applied    Home  Living                      Prior Function            PT Goals (current goals can now be found in the care plan section) Acute Rehab PT Goals Patient Stated Goal: decrease pain PT Goal Formulation: With patient Time For Goal Achievement: 08/08/19 Potential to Achieve Goals: Fair Progress towards PT goals: Not progressing toward goals - comment(pain limited)    Frequency    7X/week      PT Plan Current plan remains appropriate    Co-evaluation              AM-PAC PT "6 Clicks" Mobility   Outcome Measure  Help needed turning from your back to your side while in a flat bed without using bedrails?: Total Help needed moving from lying on your back to sitting on the side of a flat bed without using bedrails?: Total Help needed moving to and from a bed to a chair (including a wheelchair)?: Total Help needed standing up from a chair using your arms (e.g., wheelchair or bedside chair)?:  Total Help needed to walk in hospital room?: Total Help needed climbing 3-5 steps with a railing? : Total 6 Click Score: 6    End of Session   Activity Tolerance: Patient limited by pain Patient left: in bed;with call bell/phone within reach Nurse Communication: Mobility status PT Visit Diagnosis: Muscle weakness (generalized) (M62.81);Pain;Difficulty in walking, not elsewhere classified (R26.2) Pain - Right/Left: Right Pain - part of body: Hip     Time: 6759-1638 PT Time Calculation (min) (ACUTE ONLY): 12 min  Charges:  $Therapeutic Activity: 8-22 mins                     [place] Pager [phone] Office [place] 08/02/2019, 5:30 PM

## 2019-08-02 NOTE — Progress Notes (Signed)
   Subjective: 1 Day Post-Op Procedure(s) (LRB): TOTAL HIP ARTHROPLASTY ANTERIOR APPROACH (Right) Patient reports pain as moderate.   Patient seen in rounds with Dr. Wynelle Link. Patient is well, and has had no acute complaints or problems other than pain in the right hip. Reports minimal sleep overnight. No SOB or chest pain. Foley removed this morning. Reports difficult therapy yesterday.    Objective: Vital signs in last 24 hours: Temp:  [97.3 F (36.3 C)-98.7 F (37.1 C)] 98.7 F (37.1 C) (08/20 0442) Pulse Rate:  [51-75] 73 (08/20 0442) Resp:  [11-17] 14 (08/20 0442) BP: (107-138)/(44-77) 128/51 (08/20 0442) SpO2:  [99 %-100 %] 100 % (08/20 0442) Weight:  [78.9 kg] 78.9 kg (08/19 1218)  Intake/Output from previous day:  Intake/Output Summary (Last 24 hours) at 08/02/2019 0744 Last data filed at 08/02/2019 0547 Gross per 24 hour  Intake 2288.91 ml  Output 2950 ml  Net -661.09 ml     Labs: Recent Labs    08/02/19 0310  HGB 10.8*   Recent Labs    08/02/19 0310  WBC 8.0  RBC 3.73*  HCT 34.0*  PLT 184   Recent Labs    08/02/19 0310  NA 136  K 4.1  CL 102  CO2 25  BUN 14  CREATININE 0.99  GLUCOSE 186*  CALCIUM 8.1*    EXAM General - Patient is Alert and Oriented Extremity - Neurologically intact Intact pulses distally Dorsiflexion/Plantar flexion intact No cellulitis present Compartment soft Dressing - dressing C/D/I Motor Function - intact, moving foot and toes well on exam.  Hemovac pulled without difficulty.  Past Medical History:  Diagnosis Date  . Anemia    hx of  . Arthritis   . Bronchitis    hx of  . Chronic fatigue   . Diabetes mellitus   . Fibromyalgia   . GERD (gastroesophageal reflux disease)   . Hypertension    [name]  . Hypothyroidism   . Irritable bowel syndrome (IBS)    hx of  . Migraine    hx of  . Pneumonia    hx of  . PONV (postoperative nausea and vomiting)   . Shortness of breath    with exertion  . Sleep apnea     Pt denies  . Thyroid disease   . Urgency of urination   . Urinary incontinence   . Vertigo    hx of    Assessment/Plan: 1 Day Post-Op Procedure(s) (LRB): TOTAL HIP ARTHROPLASTY ANTERIOR APPROACH (Right) Principal Problem:   OA (osteoarthritis) of hip  Estimated body mass index is 28.95 kg/m as calculated from the following:   Height as of this encounter: 5\' 5"  (1.651 m).   Weight as of this encounter: 78.9 kg. Advance diet Up with therapy D/C IV fluids when tolerating POs well  DVT Prophylaxis - Xarelto Weight Bearing As Tolerated  Hemovac Pulled  Continue PT today. We would like her to get two sessions. If she is meeting her goals, DC home today. Hold DC until tomorrow if not. HHPT planned. Follow up in 2 weeks.   Ardeen Jourdain, PA-C Orthopaedic Surgery 08/02/2019, 7:44 AM

## 2019-08-02 NOTE — Progress Notes (Signed)
Physical Therapy Treatment Patient Details Name: Angelica Wood MRN: [id number] DOB: 1936-11-17 Today's Date: 08/02/2019    History of Present Illness 83 y.o. female admitted for R DA-THA. PMH: fibromyalgia, B TSA, DM, HTN    PT Comments    Pt cooperative and with very slowly improving activity tolerance.   Follow Up Recommendations  Follow surgeon's recommendation for DC plan and follow-up therapies     Equipment Recommendations  Rolling walker with 5" wheels;3in1 (PT)    Recommendations for Other Services       Precautions / Restrictions Precautions Precautions: Fall Restrictions Weight Bearing Restrictions: No RLE Weight Bearing: Weight bearing as tolerated    Mobility  Bed Mobility Overal bed mobility: Needs Assistance Bed Mobility: Sit to Supine     Supine to sit: Mod assist;Max assist;+2 for physical assistance;+2 for safety/equipment Sit to supine: Mod assist;Max assist;+2 for physical assistance;+2 for safety/equipment   General bed mobility comments: INcreased time with cues for sequence and to self assist with L LE.  Physical assist to manage bil LEs onto bed and to control trunk  Transfers Overall transfer level: Needs assistance Equipment used: Rolling walker (2 wheeled) Transfers: Sit to/from Stand Sit to Stand: Mod assist;+2 physical assistance;+2 safety/equipment         General transfer comment: increased time; cues for LE management and use of UEs to self assist..    Ambulation/Gait Ambulation/Gait assistance: Mod assist;+2 physical assistance;+2 safety/equipment Gait Distance (Feet): 3 Feet Assistive device: Rolling walker (2 wheeled) Gait Pattern/deviations: Step-to pattern;Decreased step length - right;Decreased step length - left;Shuffle;Trunk flexed Gait velocity: decr   General Gait Details: Increased time with cues for sequence, posture, and increased UE WB;  Physical assist for balance/support and to advance R LE   Stairs              Wheelchair Mobility    Modified Rankin (Stroke Patients Only)       Balance Overall balance assessment: Needs assistance Sitting-balance support: Bilateral upper extremity supported;Feet supported Sitting balance-Leahy Scale: Fair     Standing balance support: Bilateral upper extremity supported Standing balance-Leahy Scale: Poor                              Cognition Arousal/Alertness: Awake/alert Behavior During Therapy: WFL for tasks assessed/performed Overall Cognitive Status: Within Functional Limits for tasks assessed                                        Exercises Total Joint Exercises Ankle Circles/Pumps: AROM;Both;10 reps;Supine Heel Slides: AAROM;Right;Supine;Other reps (comment)(3 reps)    General Comments        Pertinent Vitals/Pain Pain Assessment: 0-10 Pain Score: 9  Pain Location: R hip with movement Pain Descriptors / Indicators: Guarding;Grimacing Pain Intervention(s): Limited activity within patient's tolerance;Monitored during session;Premedicated before session;Ice applied    Home Living                      Prior Function            PT Goals (current goals can now be found in the care plan section) Acute Rehab PT Goals Patient Stated Goal: decrease pain PT Goal Formulation: With patient Time For Goal Achievement: 08/08/19 Potential to Achieve Goals: Fair Progress towards PT goals: Progressing toward goals    Frequency    7X/week  PT Plan Current plan remains appropriate    Co-evaluation              AM-PAC PT "6 Clicks" Mobility   Outcome Measure  Help needed turning from your back to your side while in a flat bed without using bedrails?: Total Help needed moving from lying on your back to sitting on the side of a flat bed without using bedrails?: A Lot Help needed moving to and from a bed to a chair (including a wheelchair)?: A Lot Help needed standing up from a  chair using your arms (e.g., wheelchair or bedside chair)?: Total Help needed to walk in hospital room?: A Lot Help needed climbing 3-5 steps with a railing? : Total 6 Click Score: 9    End of Session Equipment Utilized During Treatment: Gait belt Activity Tolerance: Patient limited by pain Patient left: in bed;with call bell/phone within reach;with bed alarm set Nurse Communication: Mobility status PT Visit Diagnosis: Muscle weakness (generalized) (M62.81);Pain;Difficulty in walking, not elsewhere classified (R26.2) Pain - Right/Left: Right Pain - part of body: Hip     Time: 9373-4287 PT Time Calculation (min) (ACUTE ONLY): 18 min  Charges:  $Gait Training: 8-22 mins $Therapeutic Activity: 8-22 mins                     [place] Pager [phone] Office [phone]    Angelica Wood 08/02/2019, 5:44 PM

## 2019-08-02 NOTE — TOC Progression Note (Addendum)
Transition of Care [facility]) - Progression Note    Patient Details  Name: Angelica Wood MRN: [id number] Date of Birth: May 05, 1936  [facility] CM/SW Contact  Leeroy Cha, RN Phone Number: 08/02/2019, 8:32 AM  Clinical Narrative:    [facility] contacted for hhc-pt tct-kah do not cover [place] tct-[facility] hhc-Cassie-will tak e for hhc pt Expected Discharge Plan: [place] Barriers to Discharge: No Barriers Identified  Expected Discharge Plan and Services Expected Discharge Plan: [place]   Discharge Planning Services: CM Consult Post Acute Care Choice: Durable Medical Equipment, Home Health Living arrangements for the past 2 months: Single Family Home Expected Discharge Date: 08/02/19               DME Arranged: [place] Rile rolling, 3-N-1 DME Agency: [employer] Date DME Agency Contacted: 08/02/19 Time DME Agency Contacted: [phone] Representative spoke with at DME Agency: nathan HH Arranged: PT [place]: [facility] [employer] Date [place]: 08/02/19 Time [place]: [phone] Representative spoke with at [place]: [place] ([place]) Interventions    Readmission Risk Interventions No flowsheet data found.

## 2019-08-02 NOTE — Progress Notes (Signed)
Hypoglycemic Event  CBG: 68 at 1615  Treatment: 60 ml of orange juice  Symptoms: Asymptomatic   Follow-up CBG: Time:1630 CBG Result: [id number] [place] was notified via text page.     Wynonia Musty

## 2019-08-02 NOTE — Progress Notes (Signed)
Physical Therapy Treatment Patient Details Name: Angelica Wood MRN: [id number] DOB: 1936/02/28 Today's Date: 08/02/2019    History of Present Illness 83 y.o. female admitted for R DA-THA. PMH: fibromyalgia, B TSA, DM, HTN    PT Comments    Pt continues to require ++ encouragement to participate and is self-limiting 2* pain tolerance.  Pt assisted from bed, took several steps and up to recliner this session.  INCREASED time and multiple rest breaks required to complete all tasks.   Follow Up Recommendations  Follow surgeon's recommendation for DC plan and follow-up therapies     Equipment Recommendations  Rolling walker with 5" wheels;3in1 (PT)    Recommendations for Other Services       Precautions / Restrictions Precautions Precautions: Fall Restrictions Weight Bearing Restrictions: No RLE Weight Bearing: Weight bearing as tolerated    Mobility  Bed Mobility Overal bed mobility: Needs Assistance Bed Mobility: Supine to Sit     Supine to sit: Mod assist;Max assist;+2 for physical assistance;+2 for safety/equipment     General bed mobility comments: INcreased time with cues for sequence and to self assist with L LE.  Pad utilized to assist pt to rotate and move to EOB sitting  Transfers Overall transfer level: Needs assistance Equipment used: Rolling walker (2 wheeled) Transfers: Sit to/from Stand Sit to Stand: Mod assist;+2 physical assistance;+2 safety/equipment         General transfer comment: increased time; cues for LE management and use of UEs to self assist..  Pt moved sit>stand x 3 with limited tolerance to standing - on third attempt pt assisted to recliner  Ambulation/Gait Ambulation/Gait assistance: Mod assist;+2 physical assistance;+2 safety/equipment Gait Distance (Feet): 2 Feet Assistive device: Rolling walker (2 wheeled) Gait Pattern/deviations: Step-to pattern;Decreased step length - right;Decreased step length - left;Shuffle;Trunk flexed Gait  velocity: decr   General Gait Details: Increased time with cues for sequence, posture, and increased UE WB;  Physical assist for balance/support and to advance R LE   Stairs             Wheelchair Mobility    Modified Rankin (Stroke Patients Only)       Balance Overall balance assessment: Needs assistance Sitting-balance support: Bilateral upper extremity supported;Feet supported Sitting balance-Leahy Scale: Fair     Standing balance support: Bilateral upper extremity supported Standing balance-Leahy Scale: Poor                              Cognition Arousal/Alertness: Awake/alert Behavior During Therapy: WFL for tasks assessed/performed Overall Cognitive Status: Within Functional Limits for tasks assessed                                        Exercises Total Joint Exercises Ankle Circles/Pumps: AROM;Both;10 reps;Supine Heel Slides: AAROM;Right;Supine;Other reps (comment)(3 reps)    General Comments        Pertinent Vitals/Pain Pain Assessment: 0-10 Pain Score: 9  Pain Location: R hip with movement Pain Descriptors / Indicators: Guarding;Grimacing Pain Intervention(s): Limited activity within patient's tolerance;Monitored during session;Premedicated before session;Ice applied    Home Living                      Prior Function            PT Goals (current goals can now be found in the care plan section)  Acute Rehab PT Goals Patient Stated Goal: decrease pain PT Goal Formulation: With patient Time For Goal Achievement: 08/08/19 Potential to Achieve Goals: Fair Progress towards PT goals: Progressing toward goals    Frequency    7X/week      PT Plan Current plan remains appropriate    Co-evaluation              AM-PAC PT "6 Clicks" Mobility   Outcome Measure  Help needed turning from your back to your side while in a flat bed without using bedrails?: Total Help needed moving from lying on your  back to sitting on the side of a flat bed without using bedrails?: A Lot Help needed moving to and from a bed to a chair (including a wheelchair)?: A Lot Help needed standing up from a chair using your arms (e.g., wheelchair or bedside chair)?: Total Help needed to walk in hospital room?: A Lot Help needed climbing 3-5 steps with a railing? : Total 6 Click Score: 9    End of Session Equipment Utilized During Treatment: Gait belt Activity Tolerance: Patient limited by pain Patient left: in chair;with call bell/phone within reach;with chair alarm set Nurse Communication: Mobility status PT Visit Diagnosis: Muscle weakness (generalized) (M62.81);Pain;Difficulty in walking, not elsewhere classified (R26.2) Pain - Right/Left: Right Pain - part of body: Hip     Time: 1425-1500 PT Time Calculation (min) (ACUTE ONLY): 35 min  Charges:  $Gait Training: 8-22 mins $Therapeutic Activity: 8-22 mins                     Debe Coder PT Acute Rehabilitation Services Pager [phone] Office [phone]    [facility] 08/02/2019, 5:39 PM

## 2019-08-02 NOTE — TOC Transition Note (Signed)
Transition of Care [facility]) - CM/SW Discharge Note   Patient Details  Name: Angelica Wood MRN: [id number] Date of Birth: Oct 14, 1936  Transition of Care [facility]) CM/SW Contact:  Leeroy Cha, RN Phone Number: 08/02/2019, 12:31 PM   Clinical Narrative:    Discharged to home with hhc through [facility] per Cassie at [facility].   Final next level of care: [place] Barriers to Discharge: No Barriers Identified   Patient Goals and CMS Choice Patient states their goals for this hospitalization and ongoing recovery are:: to go home and get on my feet CMS Medicare.gov Compare Post Acute Care list provided to:: Patient Choice offered to / list presented to : Patient  Discharge Placement                       Discharge Plan and Services   Discharge Planning Services: CM Consult Post Acute Care Choice: Durable Medical Equipment, Home Health          DME Arranged: Walker rolling, 3-N-1 DME Agency: [employer] Date DME Agency Contacted: 08/02/19 Time DME Agency Contacted: [phone] Representative spoke with at DME Agency: nathan HH Arranged: PT [place]: [facility] [place] Date [place]: 08/02/19 Time [place]: [place] Representative spoke with at [place]: cassie  Social Determinants of Health ([place]) Interventions     Readmission Risk Interventions No flowsheet data found.

## 2019-08-03 DIAGNOSIS — M167 Other unilateral secondary osteoarthritis of hip: Secondary | ICD-10-CM

## 2019-08-03 LAB — BASIC METABOLIC PANEL
Anion gap: 7 (ref 5–15)
Anion gap: 9 (ref 5–15)
BUN: 15 mg/dL (ref 8–23)
BUN: 14 mg/dL (ref 8–23)
CO2: 27 mmol/L (ref 22–32)
CO2: 27 mmol/L (ref 22–32)
Calcium: 8.4 mg/dL — ABNORMAL LOW (ref 8.9–10.3)
Calcium: 8.3 mg/dL — ABNORMAL LOW (ref 8.9–10.3)
Chloride: 105 mmol/L (ref 98–111)
Chloride: 98 mmol/L (ref 98–111)
Creatinine, Ser: 0.89 mg/dL (ref 0.44–1.00)
Creatinine, Ser: 0.82 mg/dL (ref 0.44–1.00)
GFR calc Af Amer: >60 mL/min (ref >60)
GFR calc Af Amer: >60 mL/min (ref >60)
GFR calc non Af Amer: >60 mL/min (ref >60)
GFR calc non Af Amer: >60 mL/min (ref >60)
Glucose, Bld: 47 mg/dL — ABNORMAL LOW (ref 70–99)
Glucose, Bld: 165 mg/dL — ABNORMAL HIGH (ref 70–99)
Potassium: 4 mmol/L (ref 3.5–5.1)
Potassium: 3.9 mmol/L (ref 3.5–5.1)
Sodium: 139 mmol/L (ref 135–145)
Sodium: 134 mmol/L — ABNORMAL LOW (ref 135–145)

## 2019-08-03 LAB — CBC
HCT: 35.5 % — ABNORMAL LOW (ref 36.0–46.0)
Hemoglobin: 11.4 g/dL — ABNORMAL LOW (ref 12.0–15.0)
MCH: 29.3 pg (ref 26.0–34.0)
MCHC: 32.1 g/dL (ref 30.0–36.0)
MCV: 91.3 fL (ref 80.0–100.0)
Platelets: 181 10*3/uL (ref 150–400)
RBC: 3.89 MIL/uL (ref 3.87–5.11)
RDW: 13.2 % (ref 11.5–15.5)
WBC: 9.5 10*3/uL (ref 4.0–10.5)
nRBC: 0 % (ref 0.0–0.2)

## 2019-08-03 LAB — GLUCOSE, CAPILLARY
Glucose-Capillary: 19 mg/dL — CL (ref 70–99)
Glucose-Capillary: 23 mg/dL — CL (ref 70–99)
Glucose-Capillary: 46 mg/dL — ABNORMAL LOW (ref 70–99)
Glucose-Capillary: 40 mg/dL — CL (ref 70–99)
Glucose-Capillary: 161 mg/dL — ABNORMAL HIGH (ref 70–99)
Glucose-Capillary: 135 mg/dL — ABNORMAL HIGH (ref 70–99)
Glucose-Capillary: 108 mg/dL — ABNORMAL HIGH (ref 70–99)
Glucose-Capillary: 170 mg/dL — ABNORMAL HIGH (ref 70–99)

## 2019-08-03 MED ORDER — DEXTROSE 50 % IV SOLN
1.0000 | Freq: Once | INTRAVENOUS | Status: AC
  Administered 2019-08-03: 10:00:00 50 mL via INTRAVENOUS

## 2019-08-03 MED ORDER — GLUCOSE 40 % PO GEL
1.0000 | Freq: Once | ORAL | Status: AC | PRN
  Administered 2019-08-03: 37.5 g via ORAL
  Filled 2019-08-03: qty 1

## 2019-08-03 MED ORDER — DEXTROSE 10 % IV SOLN
INTRAVENOUS | Status: DC
  Administered 2019-08-03: 75 mL/h via INTRAVENOUS
  Administered 2019-08-04: 05:00:00 via INTRAVENOUS
  Filled 2019-08-03 (×3): qty 1000

## 2019-08-03 MED ORDER — DEXTROSE 50 % IV SOLN
INTRAVENOUS | Status: AC
  Administered 2019-08-03: 50 mL via INTRAVENOUS
  Filled 2019-08-03: qty 50

## 2019-08-03 NOTE — Progress Notes (Signed)
Physical Therapy Treatment Patient Details Name: Angelica Wood MRN: [id number] DOB: [birth year] Today's Date: 08/03/2019    History of Present Illness 83 y.o. female admitted for R DA-THA. PMH: fibromyalgia, B TSA, DM, HTN    PT Comments    Pt continues to require increased time and ++ encouragement to participate and is self-limiting with very poor pain tolerance.  This session pt progressed to ambulate short distance into hallway and tolerated increased therex.  Follow Up Recommendations  Follow surgeon's recommendation for DC plan and follow-up therapies     Equipment Recommendations  Rolling walker with 5" wheels;3in1 (PT)    Recommendations for Other Services       Precautions / Restrictions Precautions Precautions: Fall Restrictions Weight Bearing Restrictions: No RLE Weight Bearing: Weight bearing as tolerated    Mobility  Bed Mobility Overal bed mobility: Needs Assistance Bed Mobility: Supine to Sit     Supine to sit: Min assist;Mod assist;+2 for physical assistance;+2 for safety/equipment     General bed mobility comments: Increased time with step-by-step cues for sequence and to self assist with L LE.  Physical assist to manage R LE and to bring trunk to upright sitting  Transfers Overall transfer level: Needs assistance Equipment used: Rolling walker (2 wheeled) Transfers: Sit to/from Stand Sit to Stand: Mod assist;+2 physical assistance;+2 safety/equipment         General transfer comment: increased time; cues for LE management and use of UEs to self assist..    Ambulation/Gait Ambulation/Gait assistance: +2 physical assistance;+2 safety/equipment;Min assist;Mod assist Gait Distance (Feet): 13 Feet Assistive device: Rolling walker (2 wheeled) Gait Pattern/deviations: Step-to pattern;Decreased step length - right;Decreased step length - left;Shuffle;Trunk flexed Gait velocity: decr   General Gait Details: Increased time with cues for sequence,  posture, and increased UE WB;  Physical assist for balance/support    Stairs             Wheelchair Mobility    Modified Rankin (Stroke Patients Only)       Balance Overall balance assessment: Needs assistance Sitting-balance support: Feet supported Sitting balance-Leahy Scale: Good     Standing balance support: Bilateral upper extremity supported Standing balance-Leahy Scale: Poor                              Cognition Arousal/Alertness: Awake/alert Behavior During Therapy: WFL for tasks assessed/performed;Anxious;Impulsive Overall Cognitive Status: Within Functional Limits for tasks assessed                                 General Comments: Pt frequently needing to be refocused on task      Exercises Total Joint Exercises Ankle Circles/Pumps: AROM;Both;10 reps;Supine Heel Slides: AAROM;Right;Supine;15 reps Hip ABduction/ADduction: AAROM;Right;10 reps;Supine    General Comments        Pertinent Vitals/Pain Pain Assessment: Faces Faces Pain Scale: Hurts even more Pain Location: R hip with transition to EOB sitting Pain Descriptors / Indicators: Guarding;Grimacing Pain Intervention(s): Limited activity within patient's tolerance;Monitored during session;Premedicated before session;Ice applied    Home Living                      Prior Function            PT Goals (current goals can now be found in the care plan section) Acute Rehab PT Goals Patient Stated Goal: decrease pain PT Goal Formulation: With  patient Time For Goal Achievement: 08/08/19 Potential to Achieve Goals: Fair Progress towards PT goals: Progressing toward goals    Frequency    7X/week      PT Plan Current plan remains appropriate    Co-evaluation              AM-PAC PT "6 Clicks" Mobility   Outcome Measure  Help needed turning from your back to your side while in a flat bed without using bedrails?: Total Help needed moving from  lying on your back to sitting on the side of a flat bed without using bedrails?: A Lot Help needed moving to and from a bed to a chair (including a wheelchair)?: A Lot Help needed standing up from a chair using your arms (e.g., wheelchair or bedside chair)?: A Lot Help needed to walk in hospital room?: A Lot Help needed climbing 3-5 steps with a railing? : Total 6 Click Score: 10    End of Session Equipment Utilized During Treatment: Gait belt Activity Tolerance: Patient limited by pain Patient left: in chair;with call bell/phone within reach;with chair alarm set Nurse Communication: Mobility status PT Visit Diagnosis: Muscle weakness (generalized) (M62.81);Pain;Difficulty in walking, not elsewhere classified (R26.2) Pain - Right/Left: Right Pain - part of body: Hip     Time: [id number] PT Time Calculation (min) (ACUTE ONLY): 37 min  Charges:  $Gait Training: 8-22 mins $Therapeutic Exercise: 8-22 mins                     Debe Coder PT Acute Rehabilitation Services Pager [phone] Office [phone]    Celsey Asselin 08/03/2019, 6:40 PM

## 2019-08-03 NOTE — Progress Notes (Signed)
Physical Therapy Treatment Patient Details Name: Angelica Wood MRN: [id number] DOB: 1936-10-29 Today's Date: 08/03/2019    History of Present Illness 83 y.o. female admitted for R DA-THA. PMH: fibromyalgia, B TSA, DM, HTN    PT Comments    Pt assisted from chair and ambulated short distance to bed and assisted into bed.  Pt limited by anxiety and poor pain tolerance and requiring increased time and near constant cueing to focus on tasks and follow through safely   Follow Up Recommendations  Follow surgeon's recommendation for DC plan and follow-up therapies     Equipment Recommendations  Rolling walker with 5" wheels;3in1 (PT)    Recommendations for Other Services       Precautions / Restrictions Precautions Precautions: Fall Restrictions Weight Bearing Restrictions: No RLE Weight Bearing: Weight bearing as tolerated    Mobility  Bed Mobility Overal bed mobility: Needs Assistance Bed Mobility: Sit to Supine     Supine to sit: Min assist;Mod assist;+2 for physical assistance;+2 for safety/equipment Sit to supine: Mod assist;+2 for physical assistance;+2 for safety/equipment   General bed mobility comments: Increased time with step-by-step cues for sequence and to self assist with L LE.  Physical assist to manage bil LE and to control trunk   Transfers Overall transfer level: Needs assistance Equipment used: Rolling walker (2 wheeled) Transfers: Sit to/from Stand Sit to Stand: Mod assist;+2 physical assistance;+2 safety/equipment         General transfer comment: increased time; cues for LE management and use of UEs to self assist..    Ambulation/Gait Ambulation/Gait assistance: +2 physical assistance;+2 safety/equipment;Mod assist Gait Distance (Feet): 4 Feet Assistive device: Rolling walker (2 wheeled) Gait Pattern/deviations: Step-to pattern;Decreased step length - right;Decreased step length - left;Shuffle;Trunk flexed Gait velocity: decr   General Gait  Details: Increased time with cues for sequence, posture, and increased UE WB;  Physical assist for balance/support    Stairs             Wheelchair Mobility    Modified Rankin (Stroke Patients Only)       Balance Overall balance assessment: Needs assistance Sitting-balance support: Feet supported Sitting balance-Leahy Scale: Good     Standing balance support: Bilateral upper extremity supported Standing balance-Leahy Scale: Poor                              Cognition Arousal/Alertness: Awake/alert Behavior During Therapy: WFL for tasks assessed/performed;Anxious;Impulsive Overall Cognitive Status: Within Functional Limits for tasks assessed                                 General Comments: Pt requiring frequent repetition of cues to achieve follow through      Exercises Total Joint Exercises Ankle Circles/Pumps: AROM;Both;10 reps;Supine Heel Slides: AAROM;Right;Supine;15 reps Hip ABduction/ADduction: AAROM;Right;10 reps;Supine    General Comments        Pertinent Vitals/Pain Pain Assessment: Faces Faces Pain Scale: Hurts whole lot Pain Location: R hip with transition to sitting edge of recliner  Pain Descriptors / Indicators: Guarding;Grimacing;Crying;Moaning Pain Intervention(s): Limited activity within patient's tolerance;Monitored during session;Ice applied    Home Living                      Prior Function            PT Goals (current goals can now be found in the care plan  section) Acute Rehab PT Goals Patient Stated Goal: decrease pain PT Goal Formulation: With patient Time For Goal Achievement: 08/08/19 Potential to Achieve Goals: Fair Progress towards PT goals: Not progressing toward goals - comment(limited by anxiety and poor pain tolerance)    Frequency    7X/week      PT Plan Current plan remains appropriate    Co-evaluation              AM-PAC PT "6 Clicks" Mobility   Outcome  Measure  Help needed turning from your back to your side while in a flat bed without using bedrails?: Total Help needed moving from lying on your back to sitting on the side of a flat bed without using bedrails?: A Lot Help needed moving to and from a bed to a chair (including a wheelchair)?: A Lot Help needed standing up from a chair using your arms (e.g., wheelchair or bedside chair)?: A Lot Help needed to walk in hospital room?: A Lot Help needed climbing 3-5 steps with a railing? : Total 6 Click Score: 10    End of Session Equipment Utilized During Treatment: Gait belt Activity Tolerance: Patient limited by pain Patient left: in bed;with call bell/phone within reach;with bed alarm set;with nursing/sitter in room Nurse Communication: Mobility status PT Visit Diagnosis: Muscle weakness (generalized) (M62.81);Pain;Difficulty in walking, not elsewhere classified (R26.2) Pain - Right/Left: Right Pain - part of body: Hip     Time: 1650-1705 PT Time Calculation (min) (ACUTE ONLY): 15 min  Charges:  $Gait Training: 8-22 mins $Therapeutic Exercise: 8-22 mins $Therapeutic Activity: 8-22 mins                     [place] Pager [phone] Office [phone]    [facility] 08/03/2019, 6:47 PM

## 2019-08-03 NOTE — Progress Notes (Signed)
   Subjective: 2 Days Post-Op Procedure(s) (LRB): TOTAL HIP ARTHROPLASTY ANTERIOR APPROACH (Right) Patient reports pain as moderate.   Patient seen in rounds for Dr. Wynelle Link. Patient is well, and has had no acute complaints or problems other than pain in the right hip. She reports that she had a tough day yesterday with pain. Voiding without difficulty, purewick in place. Denies CP, SHOB. Slow progress with therapy yesterday. We will continue therapy today.   Objective: Vital signs in last 24 hours: Temp:  [97.5 F (36.4 C)-99.5 F (37.5 C)] 97.5 F (36.4 C) (08/21 0417) Pulse Rate:  [62-68] 62 (08/21 0417) Resp:  [16] 16 (08/21 0417) BP: (116-137)/(40-111) 116/44 (08/21 0417) SpO2:  [93 %-100 %] 100 % (08/21 0417)  Intake/Output from previous day:  Intake/Output Summary (Last 24 hours) at 08/03/2019 0827 Last data filed at 08/03/2019 0550 Gross per 24 hour  Intake 960 ml  Output 520 ml  Net 440 ml     Intake/Output this shift: No intake/output data recorded.  Labs: Recent Labs    08/02/19 0310 08/03/19 0247  HGB 10.8* 11.4*   Recent Labs    08/02/19 0310 08/03/19 0247  WBC 8.0 9.5  RBC 3.73* 3.89  HCT 34.0* 35.5*  PLT 184 181   Recent Labs    08/02/19 0310 08/03/19 0247  NA 136 139  K 4.1 4.0  CL 102 105  CO2 25 27  BUN 14 15  CREATININE 0.99 0.89  GLUCOSE 186* 47*  CALCIUM 8.1* 8.4*   No results for input(s): LABPT, INR in the last 72 hours.  Exam: General - Patient is Alert and Oriented Extremity - Neurologically intact Sensation intact distally Intact pulses distally Dorsiflexion/Plantar flexion intact Dressing - dressing C/D/I Motor Function - intact, moving foot and toes well on exam.   Past Medical History:  Diagnosis Date  . Anemia    hx of  . Arthritis   . Bronchitis    hx of  . Chronic fatigue   . Diabetes mellitus   . Fibromyalgia   . GERD (gastroesophageal reflux disease)   . Hypertension    [name]  . Hypothyroidism   .  Irritable bowel syndrome (IBS)    hx of  . Migraine    hx of  . Pneumonia    hx of  . PONV (postoperative nausea and vomiting)   . Shortness of breath    with exertion  . Sleep apnea    Pt denies  . Thyroid disease   . Urgency of urination   . Urinary incontinence   . Vertigo    hx of    Assessment/Plan: 2 Days Post-Op Procedure(s) (LRB): TOTAL HIP ARTHROPLASTY ANTERIOR APPROACH (Right) Principal Problem:   OA (osteoarthritis) of hip  Estimated body mass index is 28.95 kg/m as calculated from the following:   Height as of this encounter: 5\' 5"  (1.651 m).   Weight as of this encounter: 78.9 kg. Advance diet Up with therapy  DVT Prophylaxis - Xarelto Weight bearing as tolerated.  Plan is to go Home after hospital stay. HHPT arranged with [facility]. She has had slow progress with therapy secondary to pain. She will likely need another night in order to progress sufficiently for safe discharge home. Continue working with PT. She has also had issues with hypoglycemia while admitted, which she has difficulty with at baseline.   Griffith Citron, PA-C Orthopedic Surgery 08/03/2019, 8:27 AM

## 2019-08-03 NOTE — Consult Note (Signed)
Medical Consultation   Angelica Wood  [id number]  DOB: 05-Jun-1936  DOA: 08/01/2019  PCP: Angelica Bis, MD (Confirm with patient/family/NH records and if not entered, this has to be entered at [facility] point of entry)  Outpatient Specialists: Ortho   Requesting physician: Griffith Citron, PA-C  Reason for consultation: Hypoglycemia   History of Present Illness: Angelica Wood is an 83 y.o. female vegetarian, osteoarthritis, uncontrolled type 2 diabetes, hypertension, hypothyroidism who recently had right total hip arthroplasty on 08/01/2019 POD #2.  TRH consulted due to labile blood sugar with severe hypoglycemia.  Patient reports history of uncontrolled blood sugar for as long as she can remember.  Her type 2 diabetes is managed by her primary care provider.  She is on 70 units of Levemir at home daily.  She does not check her blood sugar consistently, checks it when she is symptomatic and usually reads low.  Reports significant unintentional weight loss but unable to quantify.  She is alert and oriented x4 but slow to process information.  At the time of this visit her CBG is 40.  1 amp of D50 ordered to be administered.  Will start D10W at 75 cc/h continuously x1 day.  She denies any cardiopulmonary or GI symptoms at this time.  Review of Systems:  ROS As per HPI otherwise 10 point review of systems negative.    Past Medical History: Past Medical History:  Diagnosis Date  . Anemia    hx of  . Arthritis   . Bronchitis    hx of  . Chronic fatigue   . Diabetes mellitus   . Fibromyalgia   . GERD (gastroesophageal reflux disease)   . Hypertension    [name]  . Hypothyroidism   . Irritable bowel syndrome (IBS)    hx of  . Migraine    hx of  . Pneumonia    hx of  . PONV (postoperative nausea and vomiting)   . Shortness of breath    with exertion  . Sleep apnea    Pt denies  . Thyroid disease   . Urgency of urination   . Urinary incontinence   . Vertigo     hx of    Past Surgical History: Past Surgical History:  Procedure Laterality Date  . ABDOMINAL HYSTERECTOMY    . APPENDECTOMY    . CHOLECYSTECTOMY    . COLONOSCOPY W/ POLYPECTOMY    . DILATION AND CURETTAGE OF UTERUS    . EYE SURGERY     right cataract removed, laser surgery for glaucoma  . JOINT REPLACEMENT Right    thumb  . TONSILLECTOMY    . TOTAL HIP ARTHROPLASTY Right 08/01/2019   Procedure: TOTAL HIP ARTHROPLASTY ANTERIOR APPROACH;  Surgeon: Gaynelle Arabian, MD;  Location: [facility];  Service: Orthopedics;  Laterality: Right;  187min  . TOTAL SHOULDER ARTHROPLASTY  06/29/2012   Procedure: TOTAL SHOULDER ARTHROPLASTY;  Surgeon: Marin Shutter, MD;  Location: [place];  Service: Orthopedics;  Laterality: Left;  left total shoulder arthroplasty  . TOTAL SHOULDER ARTHROPLASTY Right 02/08/2013   Procedure: TOTAL SHOULDER ARTHROPLASTY;  Surgeon: Marin Shutter, MD;  Location: [place];  Service: Orthopedics;  Laterality: Right;     Allergies:   Allergies  Allergen Reactions  . Gabapentin Nausea And Vomiting and Other (See Comments)    Unable to urinate  . Adenosine Other (See Comments)    Unknown rxn   .  Glucophage [Metformin Hydrochloride] Diarrhea  . Meloxicam Nausea Only    Dizziness  . Prednisone Nausea And Vomiting  . Ace Inhibitors Other (See Comments)    Cough  . Clindamycin Hcl Rash    All over torso   . Macrodantin [Nitrofurantoin Macrocrystal] Rash    Welts  . Sulfa Antibiotics Rash    Welts  . Tramadol Rash     Social History:  reports that she quit smoking about 50 years ago. Her smoking use included cigarettes. She has a 1.00 pack-year smoking history. She has never used smokeless tobacco. She reports that she does not drink alcohol or use drugs.   Family History: History reviewed. No pertinent family history.   Physical Exam: Vitals:   08/02/19 2040 08/03/19 0417 08/03/19 0915 08/03/19 1004  BP: (!) 118/40 (!) 116/44 (!) 113/57   Pulse: 62 62 64    Resp: 16 16    Temp: 98.8 F (37.1 C) (!) 97.5 F (36.4 C)    TempSrc: Oral Oral    SpO2: 99% 100%  100%  Weight:      Height:        Constitutional: Alert and awake, oriented x4, not in any acute distress. Eyes: PERLA, EOMI, irises appear normal, anicteric sclera,  ENMT: external ears and nose appear normal.  Lips appears normal, oropharynx mucosa, tongue, posterior pharynx appear normal  Neck: neck appears normal, no masses, normal ROM, no thyromegaly, no JVD  CVS: S1-S2 clear, no murmur rubs or gallops, no LE edema, normal pedal pulses  Respiratory:  clear to auscultation bilaterally, no wheezing, rales or rhonchi. Respiratory effort normal. No accessory muscle use.  Abdomen: soft nontender, nondistended, normal bowel sounds, no hepatosplenomegaly, no hernias  Musculoskeletal: : no cyanosis, clubbing.  Trace edema right thigh. Neuro: Cranial nerves II-XII intact, strength, sensation, reflexes Psych: judgement and insight appear normal, stable mood and affect, mental status Skin: no rashes or lesions or ulcers, no induration or nodules   Data reviewed:  I have personally reviewed following labs and imaging studies Labs:  CBC: Recent Labs  Lab 08/02/19 0310 08/03/19 0247  WBC 8.0 9.5  HGB 10.8* 11.4*  HCT 34.0* 35.5*  MCV 91.2 91.3  PLT 184 0000000    Basic Metabolic Panel: Recent Labs  Lab 08/02/19 0310 08/03/19 0247  NA 136 139  K 4.1 4.0  CL 102 105  CO2 25 27  GLUCOSE 186* 47*  BUN 14 15  CREATININE 0.99 0.89  CALCIUM 8.1* 8.4*   GFR Estimated Creatinine Clearance: 51.5 mL/min (by C-G formula based on SCr of 0.89 mg/dL). Liver Function Tests: No results for input(s): AST, ALT, ALKPHOS, BILITOT, PROT, ALBUMIN in the last 168 hours. No results for input(s): LIPASE, AMYLASE in the last 168 hours. No results for input(s): AMMONIA in the last 168 hours. Coagulation profile No results for input(s): INR, PROTIME in the last 168 hours.  Cardiac Enzymes: No  results for input(s): CKTOTAL, CKMB, CKMBINDEX, TROPONINI in the last 168 hours. BNP: Invalid input(s): POCBNP CBG: Recent Labs  Lab 08/02/19 2056 08/03/19 0749 08/03/19 0754 08/03/19 0843 08/03/19 1000  GLUCAP 91 19* 23* 46* 40*   D-Dimer No results for input(s): DDIMER in the last 72 hours. Hgb A1c Recent Labs    08/02/19 0310  HGBA1C 6.9*   Lipid Profile No results for input(s): CHOL, HDL, LDLCALC, TRIG, CHOLHDL, LDLDIRECT in the last 72 hours. Thyroid function studies No results for input(s): TSH, T4TOTAL, T3FREE, THYROIDAB in the last 72 hours.  Invalid input(s): FREET3 Anemia work up No results for input(s): VITAMINB12, FOLATE, FERRITIN, TIBC, IRON, RETICCTPCT in the last 72 hours. Urinalysis No results found for: COLORURINE, APPEARANCEUR, LABSPEC, [place], GLUCOSEU, [place], [place], [place], PROTEINUR, UROBILINOGEN, NITRITE, [place]   Microbiology Recent Results (from the past 240 hour(s))  Surgical pcr screen     Status: None   Collection Time: 07/26/19  3:14 PM   Specimen: Nasal Mucosa; Nasal Swab  Result Value Ref Range Status   MRSA, PCR NEGATIVE NEGATIVE Final   Staphylococcus aureus NEGATIVE NEGATIVE Final    Comment: (NOTE) The Xpert SA Assay (FDA approved for NASAL specimens in patients 81 years of age and older), is one component of a comprehensive surveillance program. It is not intended to diagnose infection nor to guide or monitor treatment. Performed at [facility], [place] [address], [place], [place] [address]   SARS CORONAVIRUS 2 Nasal Swab Aptima Multi Swab     Status: None   Collection Time: 07/28/19  4:29 PM   Specimen: Aptima Multi Swab; Nasal Swab  Result Value Ref Range Status   SARS Coronavirus 2 NEGATIVE NEGATIVE Final    Comment: (NOTE) SARS-CoV-2 target nucleic acids are NOT DETECTED. The SARS-CoV-2 RNA is generally detectable in upper and lower respiratory specimens during the acute phase of  infection. Negative results do not preclude SARS-CoV-2 infection, do not rule out co-infections with other pathogens, and should not be used as the sole basis for treatment or other patient management decisions. Negative results must be combined with clinical observations, patient history, and epidemiological information. The expected result is Negative. Fact Sheet for Patients: SugarRoll.be Fact Sheet for Healthcare Providers: https://www.woods-mathews.com/ This test is not yet approved or cleared by the [place] FDA and  has been authorized for detection and/or diagnosis of SARS-CoV-2 by FDA under an Emergency Use Authorization (EUA). This EUA will remain  in effect (meaning this test can be used) for the duration of the COVID-19 declaration under Section 56 4(b)(1) of the Act, 21 U.S.C. section 360bbb-3(b)(1), unless the authorization is terminated or revoked sooner. Performed at [facility], [place] [address], [place], [place] [address]        Inpatient Medications:   Scheduled Meds: . dextrose      . docusate sodium  100 mg Oral BID  . DULoxetine  60 mg Oral BID  . losartan  100 mg Oral Daily   And  . hydrochlorothiazide  12.5 mg Oral Daily  . levothyroxine  100 mcg Oral Q0600  . metoprolol tartrate  50 mg Oral Daily  . minocycline  100 mg Oral Daily  . montelukast  10 mg Oral Daily  . rivaroxaban  10 mg Oral Daily   Continuous Infusions: . sodium chloride Stopped (08/02/19 0802)  . dextrose    . methocarbamol (ROBAXIN) IV Stopped (08/01/19 1117)     Radiological Exams on Admission: Dg Pelvis Portable  Result Date: 08/01/2019 CLINICAL DATA:  83 year old female status post right hip replacement. Postoperative radiograph PACU. EXAM: PORTABLE PELVIS 1-2 VIEWS COMPARISON:  Earlier radiograph dated 08/01/2019 FINDINGS: There is a total right hip arthroplasty. The arthroplasty components appear intact and in anatomic  alignment. No evidence of loosening. No acute fracture or dislocation. Small pockets of soft tissue air in the right thigh, postsurgical. A drainage catheter is partially visualized. IMPRESSION: Right total hip arthroplasty.  No complications. Electronically Signed   By: Anner Crete M.D.   On: 08/01/2019 10:48    Impression/Recommendations Principal Problem:  OA (osteoarthritis) of hip  Type 2 diabetes complicated by hypoglycemia, likely iatrogenic Patient presented with labile blood sugars ranging between 186 and 47 on chemistry panel Self-reported uncontrolled blood sugars at home too in the setting of recent weight loss and low carb control She is currently on 70 units of Levemir daily which may be too much for her post weight loss and strict carb intake Last hemoglobin A1c 6.9 on 08/02/2019 with normal renal function Hold off all insulin for now CBG at the time of this visit 40, treated with D50 amp Start D10W at 75 cc/h x 1 day Monitor CBG every hour until CBG greater than 100 then every 2 hours, if CBG greater than 100 then every 4 hours is CBG greater than 100 then before meals and before bed. When patient becomes hyperglycemic may resume sensitive insulin sliding scale and calculate for long-acting and short-acting requirements  Hypertension Blood pressures currently soft Hold off oral antihypertensives for now May resume tomorrow if indicated Patient on Lopressor 50 mg daily, HCTZ 12.5 mg daily, and 100 mg of losartan daily at home. Closely monitor vital signs If becomes hypertensive resume blood pressure medication 1 at a time starting with Lopressor.  Hypothyroidism Continue levothyroxine  Chronic anxiety/depression Continue duloxetine  Post right total hip arthroplasty on 08/01/2019 POD #2 Pulmonary managing On Xarelto possibly for DVT prophylaxis   Thank you for this consultation.  Our [facility] hospitalist team will follow the patient with you.   Time Spent: 35  minutes  Kayleen Memos M.D. [facility] 08/03/2019, 10:09 AM

## 2019-08-03 NOTE — Progress Notes (Signed)
Inpatient Diabetes Program Recommendations  AACE/ADA: New Consensus Statement on Inpatient Glycemic Control (2015)  Target Ranges:  Prepandial:   less than 140 mg/dL      Peak postprandial:   less than 180 mg/dL (1-2 hours)      Critically ill patients:  140 - 180 mg/dL   Lab Results  Component Value Date   GLUCAP 135 (H) 08/03/2019   HGBA1C 6.9 (H) 08/02/2019    Review of Glycemic Control  Diabetes history: DM2 Outpatient Diabetes medications: Levemir 70 units QD Current orders for Inpatient glycemic control: None  Severe hypoglycemia this am. Home dose of Levemir 70 units QD has been d/ced.   Inpatient Diabetes Program Recommendations:     Add Novolog 0-9 units tidwc to start 8/22 am. Leave off basal insulin until pt can f/u with PCP.  Thank you. Lorenda Peck, RD, LDN, CDE Inpatient Diabetes Coordinator [phone]

## 2019-08-04 DIAGNOSIS — E1169 Type 2 diabetes mellitus with other specified complication: Secondary | ICD-10-CM | POA: Diagnosis present

## 2019-08-04 DIAGNOSIS — E039 Hypothyroidism, unspecified: Secondary | ICD-10-CM | POA: Diagnosis present

## 2019-08-04 DIAGNOSIS — I10 Essential (primary) hypertension: Secondary | ICD-10-CM | POA: Diagnosis present

## 2019-08-04 LAB — GLUCOSE, CAPILLARY
Glucose-Capillary: 255 mg/dL — ABNORMAL HIGH (ref 70–99)
Glucose-Capillary: 202 mg/dL — ABNORMAL HIGH (ref 70–99)
Glucose-Capillary: 244 mg/dL — ABNORMAL HIGH (ref 70–99)
Glucose-Capillary: 208 mg/dL — ABNORMAL HIGH (ref 70–99)
Glucose-Capillary: 132 mg/dL — ABNORMAL HIGH (ref 70–99)

## 2019-08-04 LAB — CBC
HCT: 34.1 % — ABNORMAL LOW (ref 36.0–46.0)
Hemoglobin: 10.9 g/dL — ABNORMAL LOW (ref 12.0–15.0)
MCH: 29 pg (ref 26.0–34.0)
MCHC: 32 g/dL (ref 30.0–36.0)
MCV: 90.7 fL (ref 80.0–100.0)
Platelets: 182 10*3/uL (ref 150–400)
RBC: 3.76 MIL/uL — ABNORMAL LOW (ref 3.87–5.11)
RDW: 13.2 % (ref 11.5–15.5)
WBC: 8.8 10*3/uL (ref 4.0–10.5)
nRBC: 0 % (ref 0.0–0.2)

## 2019-08-04 MED ORDER — INSULIN ASPART 100 UNIT/ML ~~LOC~~ SOLN
0.0000 [IU] | Freq: Three times a day (TID) | SUBCUTANEOUS | Status: DC
  Administered 2019-08-04: 12:00:00 7 [IU] via SUBCUTANEOUS

## 2019-08-04 MED ORDER — INSULIN ASPART 100 UNIT/ML ~~LOC~~ SOLN
4.0000 [IU] | Freq: Three times a day (TID) | SUBCUTANEOUS | Status: DC
  Administered 2019-08-04: 12:00:00 4 [IU] via SUBCUTANEOUS

## 2019-08-04 MED ORDER — INSULIN LISPRO (1 UNIT DIAL) 100 UNIT/ML (KWIKPEN): 3.0000 [IU] | PEN_INJECTOR | Freq: Three times a day (TID) | SUBCUTANEOUS | 11 refills | Status: DC

## 2019-08-04 MED ORDER — INSULIN DETEMIR 100 UNIT/ML ~~LOC~~ SOLN: 10.0000 [IU] | Freq: Every day | SUBCUTANEOUS | 11 refills | Status: DC

## 2019-08-04 MED ORDER — INSULIN ASPART 100 UNIT/ML ~~LOC~~ SOLN: 0.0000 [IU] | Freq: Every day | SUBCUTANEOUS | Status: DC

## 2019-08-04 NOTE — Progress Notes (Signed)
Subjective: 3 Days Post-Op Procedure(s) (LRB): TOTAL HIP ARTHROPLASTY ANTERIOR APPROACH (Right) Patient reports pain as 0 on 0-10 scale.   Tolerating PO without N/V +void +Flatus +WBAT/ambulation Denies SOB, CP, Calf pain  Objective: Vital signs in last 24 hours: Temp:  [98.3 F (36.8 C)-98.5 F (36.9 C)] 98.4 F (36.9 C) (08/22 0644) Pulse Rate:  [64-106] 68 (08/22 0644) Resp:  [14-15] 15 (08/22 0644) BP: (109-140)/(48-59) 109/50 (08/22 0644) SpO2:  [73 %-100 %] 100 % (08/22 0644)  Intake/Output from previous day: 08/21 0701 - 08/22 0700 In: 2029.9 [P.O.:1380; I.V.:649.9] Out: 1600 [Urine:1600] Intake/Output this shift: No intake/output data recorded.  Recent Labs    08/02/19 0310 08/03/19 0247 08/04/19 0244  HGB 10.8* 11.4* 10.9*   Recent Labs    08/03/19 0247 08/04/19 0244  WBC 9.5 8.8  RBC 3.89 3.76*  HCT 35.5* 34.1*  PLT 181 182   Recent Labs    08/03/19 0247 08/03/19 1347  NA 139 134*  K 4.0 3.9  CL 105 98  CO2 27 27  BUN 15 14  CREATININE 0.89 0.82  GLUCOSE 47* 165*  CALCIUM 8.4* 8.3*   No results for input(s): LABPT, INR in the last 72 hours.  Neurologically intact ABD soft Neurovascular intact Sensation intact distally Intact pulses distally Dorsiflexion/Plantar flexion intact Incision: dressing C/D/I No cellulitis present Compartment soft   Assessment/Plan: 3 Days Post-Op Procedure(s) (LRB): TOTAL HIP ARTHROPLASTY ANTERIOR APPROACH (Right) Advance diet Up with therapy  DVT PPx: Xarelto, TEDs, SCDs, ambulation Encourage IS WBAT Medicine following for episodes of hypoglycemia Plan D/C today after PT and cleared by medicine.     Yvonne Kendall Ward 08/04/2019, 8:30 AM

## 2019-08-04 NOTE — Progress Notes (Signed)
Physical Therapy Treatment Patient Details Name: Angelica Wood MRN: [id number] DOB: 11/13/1936 Today's Date: 08/04/2019    History of Present Illness 83 y.o. female admitted for R DA-THA. PMH: fibromyalgia, B TSA, DM, HTN    PT Comments    Pt with marked improvement in motivation, pain tolerance and ability to focus on task.  Pt sister present and advises that pt now has ramp in place at home as well as WC and that assist will be provided by niece and her husband.  Pt and sister reviewed gait, bed mobility tasks, step up to access high bed and car transfers.  Follow Up Recommendations  Home health PT     Equipment Recommendations  Rolling walker with 5" wheels;3in1 (PT)    Recommendations for Other Services       Precautions / Restrictions Precautions Precautions: Fall Restrictions Weight Bearing Restrictions: No RLE Weight Bearing: Weight bearing as tolerated    Mobility  Bed Mobility Overal bed mobility: Needs Assistance Bed Mobility: Sit to Supine       Sit to supine: Min assist;Mod assist   General bed mobility comments: increased time with cues for sequence and assist to bring both LEs up onto bed  Transfers Overall transfer level: Needs assistance Equipment used: Rolling walker (2 wheeled) Transfers: Sit to/from Stand Sit to Stand: Min assist         General transfer comment: increased time; cues for LE management and use of UEs to self assist..    Ambulation/Gait Ambulation/Gait assistance: Min assist Gait Distance (Feet): 60 Feet Assistive device: Rolling walker (2 wheeled) Gait Pattern/deviations: Step-to pattern;Decreased step length - right;Decreased step length - left;Shuffle;Trunk flexed Gait velocity: decr   General Gait Details: Increased time with cues for sequence, posture, and increased UE WB;  Physical assist for balance/support    Stairs Stairs: Yes Stairs assistance: Min assist;Mod assist Stair Management: No rails;Step to  pattern;Backwards;With walker Number of Stairs: 1 General stair comments: bkwd onto stool to access high bed   Wheelchair Mobility    Modified Rankin (Stroke Patients Only)       Balance Overall balance assessment: Needs assistance Sitting-balance support: Feet supported Sitting balance-Leahy Scale: Good     Standing balance support: Bilateral upper extremity supported Standing balance-Leahy Scale: Poor                              Cognition Arousal/Alertness: Awake/alert Behavior During Therapy: WFL for tasks assessed/performed;Anxious;Impulsive Overall Cognitive Status: Within Functional Limits for tasks assessed                                 General Comments: Pt requiring frequent repetition of cues to achieve follow through      Exercises      General Comments        Pertinent Vitals/Pain Pain Assessment: Faces Faces Pain Scale: Hurts even more Pain Location: R hip Pain Descriptors / Indicators: Guarding;Grimacing Pain Intervention(s): Limited activity within patient's tolerance;Monitored during session    Home Living                      Prior Function            PT Goals (current goals can now be found in the care plan section) Acute Rehab PT Goals Patient Stated Goal: decrease pain PT Goal Formulation: With patient Time For Goal  Achievement: 08/08/19 Potential to Achieve Goals: Fair Progress towards PT goals: Progressing toward goals    Frequency    7X/week      PT Plan Discharge plan needs to be updated    Co-evaluation              AM-PAC PT "6 Clicks" Mobility   Outcome Measure  Help needed turning from your back to your side while in a flat bed without using bedrails?: A Lot Help needed moving from lying on your back to sitting on the side of a flat bed without using bedrails?: A Little Help needed moving to and from a bed to a chair (including a wheelchair)?: A Little Help needed  standing up from a chair using your arms (e.g., wheelchair or bedside chair)?: A Little Help needed to walk in hospital room?: A Little Help needed climbing 3-5 steps with a railing? : A Lot 6 Click Score: 16    End of Session Equipment Utilized During Treatment: Gait belt Activity Tolerance: Patient limited by pain Patient left: in bed;with call bell/phone within reach;with family/visitor present Nurse Communication: Mobility status PT Visit Diagnosis: Muscle weakness (generalized) (M62.81);Pain;Difficulty in walking, not elsewhere classified (R26.2) Pain - Right/Left: Right Pain - part of body: Hip     Time: [id number] PT Time Calculation (min) (ACUTE ONLY): 40 min  Charges:  $Gait Training: 23-37 mins $Therapeutic Activity: 8-22 mins                     Debe Coder PT Acute Rehabilitation Services Pager [phone] Office [phone]    Alexis Reber 08/04/2019, 5:05 PM

## 2019-08-04 NOTE — Progress Notes (Addendum)
Patient Demographics:    Angelica Wood, is a 83 y.o. female, DOB - 12/20/1935, [id number]  Admit date - 08/01/2019   Admitting Physician Gaynelle Arabian, MD  Outpatient Primary MD for the patient is Caryl Bis, MD  LOS - 3   No chief complaint on file.       Subjective:    Angelica Wood today has no fevers, no emesis,  No chest pain, sister is at bedside, patient very very eager to go home  Assessment  & Plan :    Principal Problem:   OA (osteoarthritis) of hip Active Problems:   Type 2 diabetes mellitus with other specified complication (HCC)   HTN (hypertension)   Hypothyroidism  1)Reduce Levemir insulin to only 10 units at bedtime starting on Sunday, 08/05/2019 (please do not take the 65 units you were  taking previously as your blood sugars will get too low) 2) take NovoLog insulin 3 units  with meals 3 times a day 3) please follow-up with your primary care physician for further adjustment of your insulin regimen 4) please do not restart Ozempic    Brief Summary 83 y.o. female vegetarian, osteoarthritis, uncontrolled type 2 diabetes, hypertension, hypothyroidism who recently had right total hip arthroplasty on 08/01/2019 , hospitalist service was consulted on 08/03/2019 due to erratic blood sugars with concerns about hypoglycemia  A/p 1)DM2-last A1c was 6.9 , prior to admission patient was taking  Ozempic and Levemir 65 units nightly--- last dose of Levemir insulin was 08/02/2019 in a.m., patient had persistent hypoglycemia requiring dextrose infusion --IV dextrose discontinued, blood sugars noted after discontinuation of IV dextrose -Renal function noted with a creatinine of 0.89 -From a diabetic standpoint okay to discharge home with adjustment to insulin regimen as noted below  -1)Reduce Levemir insulin to only 10 units at bedtime starting on Sunday, 08/05/2019 (please do not take the 65  units you were  taking previously as your blood sugars will get too low) 2) take NovoLog insulin 3 units  with meals 3 times a day 3) please follow-up with your primary care physician for further adjustment of your insulin regimen 4) please do not restart Ozempic  2)HTN--stable, continue losartan HCTZ and metoprolol  3) hypothyroidism-continue levothyroxine  4) status post right THR--on 08/01/2019, further management per orthopedic team -DVT prophylaxis (Xarelto) as per orthopedic team -Pain control as per orthopedic team  Disposition--apparently patient is being discharged home by orthopedic team with home health services -Orthopedic team is primary, hospitalist service is consulting for medical problems  Code Status : full  Family Communication:   (patient is alert, awake and coherent) Discussed with patient's sister who is at bedside  Lab Results  Component Value Date   PLT 182 08/04/2019    Inpatient Medications  Scheduled Meds: . docusate sodium  100 mg Oral BID  . DULoxetine  60 mg Oral BID  . losartan  100 mg Oral Daily   And  . hydrochlorothiazide  12.5 mg Oral Daily  . insulin aspart  0-20 Units Subcutaneous TID WC  . insulin aspart  0-5 Units Subcutaneous QHS  . insulin aspart  4 Units Subcutaneous TID WC  . levothyroxine  100 mcg Oral Q0600  . metoprolol tartrate  50 mg Oral Daily  .  minocycline  100 mg Oral Daily  . montelukast  10 mg Oral Daily  . rivaroxaban  10 mg Oral Daily   Continuous Infusions: . sodium chloride 10 mL/hr at 08/04/19 0920  . methocarbamol (ROBAXIN) IV Stopped (08/01/19 1117)   PRN Meds:.acetaminophen, albuterol, bisacodyl, diphenhydrAMINE, HYDROcodone-acetaminophen, HYDROcodone-acetaminophen, LORazepam, menthol-cetylpyridinium **OR** phenol, methocarbamol **OR** methocarbamol (ROBAXIN) IV, metoCLOPramide **OR** metoCLOPramide (REGLAN) injection, morphine injection, ondansetron **OR** ondansetron (ZOFRAN) IV, polyethylene glycol, sodium  phosphate    Anti-infectives (From admission, onward)   Start     Dose/Rate Route Frequency Ordered Stop   08/02/19 1000  minocycline (MINOCIN) capsule 100 mg     100 mg Oral Daily 08/01/19 1213     08/01/19 1400  ceFAZolin (ANCEF) IVPB 2g/100 mL premix     2 g 200 mL/hr over 30 Minutes Intravenous Every 6 hours 08/01/19 1213 08/01/19 2239   08/01/19 0645  ceFAZolin (ANCEF) IVPB 2g/100 mL premix     2 g 200 mL/hr over 30 Minutes Intravenous On call to O.R. 08/01/19 0631 08/01/19 0857        Objective:   Vitals:   08/03/19 2036 08/04/19 0644 08/04/19 0930 08/04/19 1448  BP: (!) 140/59 (!) 109/50 130/70 (!) 136/51  Pulse: 77 68 69 60  Resp: 14 15 16 16   Temp: 98.5 F (36.9 C) 98.4 F (36.9 C)  98.4 F (36.9 C)  TempSrc: Oral Oral    SpO2: 100% 100% 100% 100%  Weight:      Height:        Wt Readings from Last 3 Encounters:  08/01/19 78.9 kg  07/26/19 78.9 kg  05/25/19 80.3 kg     Intake/Output Summary (Last 24 hours) at 08/04/2019 1553 Last data filed at 08/04/2019 1400 Gross per 24 hour  Intake 2214.31 ml  Output 1800 ml  Net 414.31 ml    Physical Exam  Gen:- Awake Alert,  In no apparent distress  HEENT:- [place].AT, No sclera icterus Ears--somewhat hard of hearing Neck-Supple Neck,No JVD,.  Lungs-  CTAB , fair symmetrical air movement CV- S1, S2 normal, regular  Abd-  +ve B.Sounds, Abd Soft, No tenderness,    Extremity/Skin:-  pedal pulses present , right hip postop wound clean dry and intact Psych-affect is appropriate, oriented x3 Neuro-no new focal deficits, no tremors   Data Review:   Micro Results Recent Results (from the past 240 hour(s))  Surgical pcr screen     Status: None   Collection Time: 07/26/19  3:14 PM   Specimen: Nasal Mucosa; Nasal Swab  Result Value Ref Range Status   MRSA, PCR NEGATIVE NEGATIVE Final   Staphylococcus aureus NEGATIVE NEGATIVE Final    Comment: (NOTE) The Xpert SA Assay (FDA approved for NASAL specimens in patients 105  years of age and older), is one component of a comprehensive surveillance program. It is not intended to diagnose infection nor to guide or monitor treatment. Performed at [facility], [place] [address] [place], [place] [address]   SARS CORONAVIRUS 2 Nasal Swab Aptima Multi Swab     Status: None   Collection Time: 07/28/19  4:29 PM   Specimen: Aptima Multi Swab; Nasal Swab  Result Value Ref Range Status   SARS Coronavirus 2 NEGATIVE NEGATIVE Final    Comment: (NOTE) SARS-CoV-2 target nucleic acids are NOT DETECTED. The SARS-CoV-2 RNA is generally detectable in upper and lower respiratory specimens during the acute phase of infection. Negative results do not preclude SARS-CoV-2 infection, do not rule out co-infections with other  pathogens, and should not be used as the sole basis for treatment or other patient management decisions. Negative results must be combined with clinical observations, patient history, and epidemiological information. The expected result is Negative. Fact Sheet for Patients: SugarRoll.be Fact Sheet for Healthcare Providers: https://www.woods-mathews.com/ This test is not yet approved or cleared by the [place] FDA and  has been authorized for detection and/or diagnosis of SARS-CoV-2 by FDA under an Emergency Use Authorization (EUA). This EUA will remain  in effect (meaning this test can be used) for the duration of the COVID-19 declaration under Section 56 4(b)(1) of the Act, 21 U.S.C. section 360bbb-3(b)(1), unless the authorization is terminated or revoked sooner. Performed at [facility], [place] [address] [place], [place] [address]     Radiology Reports Dg Pelvis Portable  Result Date: 08/01/2019 CLINICAL DATA:  82 year old female status post right hip replacement. Postoperative radiograph PACU. EXAM: PORTABLE PELVIS 1-2 VIEWS COMPARISON:  Earlier radiograph dated 08/01/2019  FINDINGS: There is a total right hip arthroplasty. The arthroplasty components appear intact and in anatomic alignment. No evidence of loosening. No acute fracture or dislocation. Small pockets of soft tissue air in the right thigh, postsurgical. A drainage catheter is partially visualized. IMPRESSION: Right total hip arthroplasty.  No complications. Electronically Signed   By: Anner Crete M.D.   On: 08/01/2019 10:48   Dg C-arm 1-60 Min-no Report  Result Date: 08/01/2019 Fluoroscopy was utilized by the requesting physician.  No radiographic interpretation.   Dg Hip Operative Unilat W Or W/o Pelvis Right  Result Date: 08/01/2019 CLINICAL DATA:  Surgery EXAM: OPERATIVE RIGHT HIP (WITH PELVIS IF PERFORMED) 3 VIEWS TECHNIQUE: Fluoroscopic spot image(s) were submitted for interpretation post-operatively. COMPARISON:  None. FINDINGS: Changes of right hip replacement. Normal AP alignment. No hardware bony complicating feature. IMPRESSION: Right hip replacement.  No complicating feature. Electronically Signed   By: Rolm Baptise M.D.   On: 08/01/2019 10:02     CBC Recent Labs  Lab 08/02/19 0310 08/03/19 0247 08/04/19 0244  WBC 8.0 9.5 8.8  HGB 10.8* 11.4* 10.9*  HCT 34.0* 35.5* 34.1*  PLT 184 181 182  MCV 91.2 91.3 90.7  MCH 29.0 29.3 29.0  MCHC 31.8 32.1 32.0  RDW 13.2 13.2 13.2    Chemistries  Recent Labs  Lab 08/02/19 0310 08/03/19 0247 08/03/19 1347  NA 136 139 134*  K 4.1 4.0 3.9  CL 102 105 98  CO2 25 27 27   GLUCOSE 186* 47* 165*  BUN 14 15 14   CREATININE 0.99 0.89 0.82  CALCIUM 8.1* 8.4* 8.3*   ------------------------------------------------------------------------------------------------------------------ No results for input(s): CHOL, HDL, LDLCALC, TRIG, CHOLHDL, LDLDIRECT in the last 72 hours.  Lab Results  Component Value Date   HGBA1C 6.9 (H) 08/02/2019    ------------------------------------------------------------------------------------------------------------------ No results for input(s): TSH, T4TOTAL, T3FREE, THYROIDAB in the last 72 hours.  Invalid input(s): FREET3 ------------------------------------------------------------------------------------------------------------------ No results for input(s): VITAMINB12, FOLATE, FERRITIN, TIBC, IRON, RETICCTPCT in the last 72 hours.  Coagulation profile No results for input(s): INR, PROTIME in the last 168 hours.  No results for input(s): DDIMER in the last 72 hours.  Cardiac Enzymes No results for input(s): CKMB, TROPONINI, MYOGLOBIN in the last 168 hours.  Invalid input(s): CK ------------------------------------------------------------------------------------------------------------------ No results found for: BNP   Roxan Hockey M.D on 08/04/2019 at 3:53 PM  Go to [email or web address] - for contact info  [facility] - Office  [phone]

## 2019-08-04 NOTE — Progress Notes (Signed)
Physical Therapy Treatment Patient Details Name: Angelica Wood MRN: [id number] DOB: January 29, 1936 Today's Date: 08/04/2019    History of Present Illness 83 y.o. female admitted for R DA-THA. PMH: fibromyalgia, B TSA, DM, HTN    PT Comments    Pt very tearful this am and wanting to go home.  Explained to pt level of assist she has been requiring and that she was requiring and that she only had her elderly sister to assist at home.  Pt with improved activity tolerance despite c/o pain but continues to require significant assist for all mobility tasks.   Follow Up Recommendations  Home health PT     Equipment Recommendations  Rolling walker with 5" wheels;3in1 (PT)    Recommendations for Other Services       Precautions / Restrictions Precautions Precautions: Fall Restrictions Weight Bearing Restrictions: No RLE Weight Bearing: Weight bearing as tolerated    Mobility  Bed Mobility Overal bed mobility: Needs Assistance Bed Mobility: Supine to Sit     Supine to sit: Min assist;Mod assist;+2 for physical assistance;+2 for safety/equipment     General bed mobility comments: Increased time with step-by-step cues for sequence and to self assist with L LE.  Physical assist to manage R LE and to control trunk   Transfers Overall transfer level: Needs assistance Equipment used: Rolling walker (2 wheeled) Transfers: Sit to/from Stand Sit to Stand: Min assist;Mod assist;+2 physical assistance;+2 safety/equipment;From elevated surface         General transfer comment: increased time; cues for LE management and use of UEs to self assist..    Ambulation/Gait Ambulation/Gait assistance: Min assist;+2 safety/equipment Gait Distance (Feet): 27 Feet Assistive device: Rolling walker (2 wheeled) Gait Pattern/deviations: Step-to pattern;Decreased step length - right;Decreased step length - left;Shuffle;Trunk flexed Gait velocity: decr   General Gait Details: Increased time with cues  for sequence, posture, and increased UE WB;  Physical assist for balance/support    Stairs             Wheelchair Mobility    Modified Rankin (Stroke Patients Only)       Balance Overall balance assessment: Needs assistance Sitting-balance support: Feet supported Sitting balance-Leahy Scale: Good     Standing balance support: Bilateral upper extremity supported Standing balance-Leahy Scale: Poor                              Cognition Arousal/Alertness: Awake/alert Behavior During Therapy: WFL for tasks assessed/performed;Anxious;Impulsive Overall Cognitive Status: Within Functional Limits for tasks assessed                                 General Comments: Pt requiring frequent repetition of cues to achieve follow through      Exercises Total Joint Exercises Ankle Circles/Pumps: AROM;Both;10 reps;Supine Quad Sets: AROM;Both;Supine;Other (comment)(Pt tolerated 2 reps only 2* pain) Heel Slides: AAROM;Right;Supine;15 reps Hip ABduction/ADduction: AAROM;Right;10 reps;Supine    General Comments        Pertinent Vitals/Pain Pain Assessment: Faces Faces Pain Scale: Hurts whole lot Pain Location: R hip Pain Descriptors / Indicators: Guarding;Grimacing;Crying;Moaning Pain Intervention(s): Limited activity within patient's tolerance;Monitored during session;Premedicated before session;Ice applied    Home Living                      Prior Function            PT Goals (current  goals can now be found in the care plan section) Acute Rehab PT Goals Patient Stated Goal: decrease pain PT Goal Formulation: With patient Time For Goal Achievement: 08/08/19 Potential to Achieve Goals: Fair Progress towards PT goals: Progressing toward goals    Frequency    7X/week      PT Plan Discharge plan needs to be updated    Co-evaluation              AM-PAC PT "6 Clicks" Mobility   Outcome Measure  Help needed turning from  your back to your side while in a flat bed without using bedrails?: Total Help needed moving from lying on your back to sitting on the side of a flat bed without using bedrails?: A Lot Help needed moving to and from a bed to a chair (including a wheelchair)?: A Lot Help needed standing up from a chair using your arms (e.g., wheelchair or bedside chair)?: A Lot Help needed to walk in hospital room?: A Lot Help needed climbing 3-5 steps with a railing? : Total 6 Click Score: 10    End of Session Equipment Utilized During Treatment: Gait belt Activity Tolerance: Patient limited by pain Patient left: in chair;with call bell/phone within reach;with chair alarm set Nurse Communication: Mobility status PT Visit Diagnosis: Muscle weakness (generalized) (M62.81);Pain;Difficulty in walking, not elsewhere classified (R26.2) Pain - Right/Left: Right Pain - part of body: Hip     Time: [id number] PT Time Calculation (min) (ACUTE ONLY): 31 min  Charges:  $Gait Training: 8-22 mins $Therapeutic Exercise: 8-22 mins                     [place] Pager [phone] Office [phone]    Willys Salvino 08/04/2019, 11:11 AM

## 2019-08-04 NOTE — Progress Notes (Signed)
    Home health agencies that serve [phone].        [place] Quality of Patient Care Rating Patient Survey Summary Rating  [facility] [phone] 4 out of 5 stars 4 out of [place] [phone] 4  out of 5 stars 3 out of [place] [phone]) [phone] 4 out of 5 stars 4 out of 5 stars  [facility] [phone] 3 out of 5 stars 3 out of [place] [phone] 3  out of 5 stars 4 out of [place] [phone] 3  out of 5 stars 4 out of [place] [phone] 2  out of 5 stars 4 out of [place] number Footnote as displayed on [place]  1 This agency provides services under a federal waiver program to non-traditional, chronic long term population.  2 This agency provides services to a special needs population.  3 Not Available.  4 The number of patient episodes for this measure is too small to report.  5 This measure currently does not have data or provider has been certified/recertified for less than 6 months.  6 The national average for this measure is not provided because of state-to-state differences in data collection.  7 Medicare is not displaying rates for this measure for any home health agency, because of an issue with the data.  8 There were problems with the data and they are being corrected.  9 Zero, or very few, patients met the survey's rules for inclusion. The scores shown, if any, reflect a very small number of surveys and may not accurately tell how an agency is doing.  10 Survey results are based on less than 12 months of data.  11 Fewer than 70 patients completed the survey. Use the scores shown, if any, with caution as the number of surveys may be too low to accurately tell how an agency  is doing.  12 No survey results are available for this period.  13 Data suppressed by CMS for one or more quarters.

## 2019-08-04 NOTE — TOC Progression Note (Signed)
Transition of Care [facility]) - Progression Note    Patient Details  Name: Angelica Wood MRN: [id number] Date of Birth: 12-Feb-1936  [facility] CM/SW Contact  Joaquin Courts, RN Phone Number: 08/04/2019, 10:35 AM  Clinical Narrative:    CM spoke with patient at bedside. Patient set up with [facility] for HHPT. Patient reports she has a rolling walker and 3-in-1.    Expected Discharge Plan: [place] Barriers to Discharge: No Barriers Identified  Expected Discharge Plan and Services Expected Discharge Plan: [place]   Discharge Planning Services: CM Consult Post Acute Care Choice: Durable Medical Equipment, Home Health Living arrangements for the past 2 months: Single Family Home Expected Discharge Date: 08/02/19               DME Arranged: Gilford Rile rolling, 3-N-1 DME Agency: [facility] Date DME Agency Contacted: 08/02/19 Time DME Agency Contacted: [phone] Representative spoke with at DME Agency: nathan HH Arranged: PT [place] Date [place]: 08/02/19 Time [place]: [place] Representative spoke with at [place]: cassie   Social Determinants of Health ([place]) Interventions    Readmission Risk Interventions No flowsheet data found.

## 2019-08-06 LAB — GLUCOSE, CAPILLARY: Glucose-Capillary: 35 mg/dL — CL (ref 70–99)

## 2019-08-07 NOTE — Discharge Summary (Signed)
Physician Discharge Summary   Patient ID: Angelica Wood MRN: [id number] DOB/AGE: 1936-01-09 84 y.o.  Admit date: 08/01/2019 Discharge date: 08/04/2019  Primary Diagnosis: Osteoarthritis of the Right  hip.  Admission Diagnoses:  Past Medical History:  Diagnosis Date  . Anemia    hx of  . Arthritis   . Bronchitis    hx of  . Chronic fatigue   . Diabetes mellitus   . Fibromyalgia   . GERD (gastroesophageal reflux disease)   . Hypertension    [name]  . Hypothyroidism   . Irritable bowel syndrome (IBS)    hx of  . Migraine    hx of  . Pneumonia    hx of  . PONV (postoperative nausea and vomiting)   . Shortness of breath    with exertion  . Sleep apnea    Pt denies  . Thyroid disease   . Urgency of urination   . Urinary incontinence   . Vertigo    hx of   Discharge Diagnoses:   Principal Problem:   OA (osteoarthritis) of hip Active Problems:   Type 2 diabetes mellitus with other specified complication (HCC)   HTN (hypertension)   Hypothyroidism  Estimated body mass index is 28.95 kg/m as calculated from the following:   Height as of this encounter: 5\' 5"  (1.651 m).   Weight as of this encounter: 78.9 kg.  Procedure:  Procedure(s) (LRB): TOTAL HIP ARTHROPLASTY ANTERIOR APPROACH (Right)   Consults: internal medicine  HPI: Angelica Wood is a 83 y.o. female who has advanced end-  stage arthritis of their Right  hip with progressively worsening pain and  dysfunction.The patient has failed nonoperative management and presents for  total hip arthroplasty.   Laboratory Data: Admission on 08/01/2019, Discharged on 08/04/2019  Component Date Value Ref Range Status  . Hgb A1c MFr Bld 08/02/2019 6.9* 4.8 - 5.6 % Final   Comment: (NOTE) Pre diabetes:          5.7%-6.4% Diabetes:              >6.4% Glycemic control for   <7.0% adults with diabetes   . Mean Plasma Glucose 08/02/2019 151.33  mg/dL Final   Performed at [facility] [address],  [place], [place] [address]  . Glucose-Capillary 08/01/2019 70  70 - 99 mg/dL Final  . Glucose-Capillary 08/01/2019 67* 70 - 99 mg/dL Final  . Glucose-Capillary 08/01/2019 109* 70 - 99 mg/dL Final  . Glucose-Capillary 08/01/2019 35* 70 - 99 mg/dL Final   Comment: REPEATED TO VERIFY, CHARGE CREDITED Performed at [facility], [place] [address], [place], [place] [address]   . Comment 1 08/01/2019 Notify RN   Final  . Glucose-Capillary 08/01/2019 37* 70 - 99 mg/dL Final  . Comment 1 08/01/2019 Document in Chart   Final  . Glucose-Capillary 08/01/2019 83  70 - 99 mg/dL Final  . Glucose-Capillary 08/01/2019 143* 70 - 99 mg/dL Final  . Glucose-Capillary 08/01/2019 46* 70 - 99 mg/dL Final  . Glucose-Capillary 08/01/2019 84  70 - 99 mg/dL Final  . WBC 08/02/2019 8.0  4.0 - 10.5 K/uL Final  . RBC 08/02/2019 3.73* 3.87 - 5.11 MIL/uL Final  . Hemoglobin 08/02/2019 10.8* 12.0 - 15.0 g/dL Final  . HCT 08/02/2019 34.0* 36.0 - 46.0 % Final  . MCV 08/02/2019 91.2  80.0 - 100.0 fL Final  . MCH 08/02/2019 29.0  26.0 - 34.0 pg Final  . MCHC 08/02/2019 31.8  30.0 -  36.0 g/dL Final  . RDW 08/02/2019 13.2  11.5 - 15.5 % Final  . Platelets 08/02/2019 184  150 - 400 K/uL Final  . nRBC 08/02/2019 0.0  0.0 - 0.2 % Final   Performed at [facility], [place] [address], [place], [place] [address]  . Sodium 08/02/2019 136  135 - 145 mmol/L Final  . Potassium 08/02/2019 4.1  3.5 - 5.1 mmol/L Final  . Chloride 08/02/2019 102  98 - 111 mmol/L Final  . CO2 08/02/2019 25  22 - 32 mmol/L Final  . Glucose, Bld 08/02/2019 186* 70 - 99 mg/dL Final  . BUN 08/02/2019 14  8 - 23 mg/dL Final  . Creatinine, Ser 08/02/2019 0.99  0.44 - 1.00 mg/dL Final  . Calcium 08/02/2019 8.1* 8.9 - 10.3 mg/dL Final  . GFR calc non Af Amer 08/02/2019 53* >60 mL/min Final  . GFR calc Af Amer 08/02/2019 >60  >60 mL/min Final  . Anion gap 08/02/2019 9  5 - 15 Final   Performed at [facility], [place] [address], [place], [place] [address]  . Glucose-Capillary 08/01/2019 83  70 - 99 mg/dL Final  . Glucose-Capillary 08/02/2019 141* 70 - 99 mg/dL Final  . Glucose-Capillary 08/02/2019 165* 70 - 99 mg/dL Final  . Glucose-Capillary 08/02/2019 157* 70 - 99 mg/dL Final  . Glucose-Capillary 08/02/2019 68* 70 - 99 mg/dL Final  . Glucose-Capillary 08/02/2019 76  70 - 99 mg/dL Final  . WBC 08/03/2019 9.5  4.0 - 10.5 K/uL Final  . RBC 08/03/2019 3.89  3.87 - 5.11 MIL/uL Final  . Hemoglobin 08/03/2019 11.4* 12.0 - 15.0 g/dL Final  . HCT 08/03/2019 35.5* 36.0 - 46.0 % Final  . MCV 08/03/2019 91.3  80.0 - 100.0 fL Final  . MCH 08/03/2019 29.3  26.0 - 34.0 pg Final  . MCHC 08/03/2019 32.1  30.0 - 36.0 g/dL Final  . RDW 08/03/2019 13.2  11.5 - 15.5 % Final  . Platelets 08/03/2019 181  150 - 400 K/uL Final  . nRBC 08/03/2019 0.0  0.0 - 0.2 % Final   Performed at [facility], [place] [address] [place], [place] [address]  . Sodium 08/03/2019 139  135 - 145 mmol/L Final  . Potassium 08/03/2019 4.0  3.5 - 5.1 mmol/L Final  . Chloride 08/03/2019 105  98 - 111 mmol/L Final  . CO2 08/03/2019 27  22 - 32 mmol/L Final  . Glucose, Bld 08/03/2019 47* 70 - 99 mg/dL Final  . BUN 08/03/2019 15  8 - 23 mg/dL Final  . Creatinine, Ser 08/03/2019 0.89  0.44 - 1.00 mg/dL Final  . Calcium 08/03/2019 8.4* 8.9 - 10.3 mg/dL Final  . GFR calc non Af Amer 08/03/2019 >60  >60 mL/min Final  . GFR calc Af Amer 08/03/2019 >60  >60 mL/min Final  . Anion gap 08/03/2019 7  5 - 15 Final   Performed at [facility], [place] [address], [place],  [address]  . Glucose-Capillary 08/02/2019 91  70 - 99 mg/dL Final  . Glucose-Capillary 08/03/2019 19* 70 - 99 mg/dL Final  . Comment 1 08/03/2019 Notify RN   Final  . Glucose-Capillary 08/03/2019 23* 70 - 99 mg/dL Final  . Comment 1 08/03/2019 Notify RN   Final  . Glucose-Capillary 08/03/2019 46* 70 - 99 mg/dL Final  . Glucose-Capillary 08/03/2019  40* 70 - 99 mg/dL Final  . Comment 1 08/03/2019 Notify RN   Final  . Sodium 08/03/2019  134* 135 - 145 mmol/L Final  . Potassium 08/03/2019 3.9  3.5 - 5.1 mmol/L Final  . Chloride 08/03/2019 98  98 - 111 mmol/L Final  . CO2 08/03/2019 27  22 - 32 mmol/L Final  . Glucose, Bld 08/03/2019 165* 70 - 99 mg/dL Final  . BUN 08/03/2019 14  8 - 23 mg/dL Final  . Creatinine, Ser 08/03/2019 0.82  0.44 - 1.00 mg/dL Final  . Calcium 08/03/2019 8.3* 8.9 - 10.3 mg/dL Final  . GFR calc non Af Amer 08/03/2019 >60  >60 mL/min Final  . GFR calc Af Amer 08/03/2019 >60  >60 mL/min Final  . Anion gap 08/03/2019 9  5 - 15 Final   Performed at [facility], [place] [address], [place], [place] [address]  . Glucose-Capillary 08/03/2019 161* 70 - 99 mg/dL Final  . Glucose-Capillary 08/03/2019 135* 70 - 99 mg/dL Final  . Glucose-Capillary 08/03/2019 108* 70 - 99 mg/dL Final  . WBC 08/04/2019 8.8  4.0 - 10.5 K/uL Final  . RBC 08/04/2019 3.76* 3.87 - 5.11 MIL/uL Final  . Hemoglobin 08/04/2019 10.9* 12.0 - 15.0 g/dL Final  . HCT 08/04/2019 34.1* 36.0 - 46.0 % Final  . MCV 08/04/2019 90.7  80.0 - 100.0 fL Final  . MCH 08/04/2019 29.0  26.0 - 34.0 pg Final  . MCHC 08/04/2019 32.0  30.0 - 36.0 g/dL Final  . RDW 08/04/2019 13.2  11.5 - 15.5 % Final  . Platelets 08/04/2019 182  150 - 400 K/uL Final  . nRBC 08/04/2019 0.0  0.0 - 0.2 % Final   Performed at [facility], [place] [address], [place], [place] [address]  . Glucose-Capillary 08/03/2019 170* 70 - 99 mg/dL Final  . Glucose-Capillary 08/04/2019 255* 70 - 99 mg/dL Final  . Glucose-Capillary 08/04/2019 202* 70 - 99 mg/dL Final  . Comment 1 08/04/2019 Notify RN   Final  . Comment 2 08/04/2019 Document in Chart   Final  . Glucose-Capillary 08/04/2019 244* 70 - 99 mg/dL Final  . Glucose-Capillary 08/04/2019 208* 70 - 99 mg/dL Final  . Glucose-Capillary 08/04/2019 132* 70 - 99 mg/dL Final  Hospital Outpatient Visit on 07/28/2019   Component Date Value Ref Range Status  . SARS Coronavirus 2 07/28/2019 NEGATIVE  NEGATIVE Final   Comment: (NOTE) SARS-CoV-2 target nucleic acids are NOT DETECTED. The SARS-CoV-2 RNA is generally detectable in upper and lower respiratory specimens during the acute phase of infection. Negative results do not preclude SARS-CoV-2 infection, do not rule out co-infections with other pathogens, and should not be used as the sole basis for treatment or other patient management decisions. Negative results must be combined with clinical observations, patient history, and epidemiological information. The expected result is Negative. Fact Sheet for Patients: SugarRoll.be Fact Sheet for Healthcare Providers: https://www.woods-mathews.com/ This test is not yet approved or cleared by the [place] FDA and  has been authorized for detection and/or diagnosis of SARS-CoV-2 by FDA under an Emergency Use Authorization (EUA). This EUA will remain  in effect (meaning this test can be used) for the duration of the COVID-19 declaration under Section 56                          4(b)(1) of the Act, 21 U.S.C. section 360bbb-3(b)(1), unless the authorization is terminated or revoked sooner. Performed at [facility], [place] [address] [place], [place] [address]   Hospital Outpatient Visit on 07/26/2019  Component Date Value  Ref Range Status  . aPTT 07/26/2019 30  24 - 36 seconds Final   Performed at [facility], [place] [address], [place], [place] [address]  . WBC 07/26/2019 6.0  4.0 - 10.5 K/uL Final  . RBC 07/26/2019 4.73  3.87 - 5.11 MIL/uL Final  . Hemoglobin 07/26/2019 13.4  12.0 - 15.0 g/dL Final  . HCT 07/26/2019 42.8  36.0 - 46.0 % Final  . MCV 07/26/2019 90.5  80.0 - 100.0 fL Final  . MCH 07/26/2019 28.3  26.0 - 34.0 pg Final  . MCHC 07/26/2019 31.3  30.0 - 36.0 g/dL Final  . RDW 07/26/2019 13.3  11.5 - 15.5 % Final  . Platelets  07/26/2019 268  150 - 400 K/uL Final  . nRBC 07/26/2019 0.0  0.0 - 0.2 % Final   Performed at [facility], [place] [address], [place], [place] [address]  . Sodium 07/26/2019 140  135 - 145 mmol/L Final  . Potassium 07/26/2019 4.2  3.5 - 5.1 mmol/L Final  . Chloride 07/26/2019 104  98 - 111 mmol/L Final  . CO2 07/26/2019 27  22 - 32 mmol/L Final  . Glucose, Bld 07/26/2019 64* 70 - 99 mg/dL Final  . BUN 07/26/2019 23  8 - 23 mg/dL Final  . Creatinine, Ser 07/26/2019 1.02* 0.44 - 1.00 mg/dL Final  . Calcium 07/26/2019 9.4  8.9 - 10.3 mg/dL Final  . Total Protein 07/26/2019 6.6  6.5 - 8.1 g/dL Final  . Albumin 07/26/2019 3.7  3.5 - 5.0 g/dL Final  . AST 07/26/2019 16  15 - 41 U/L Final  . ALT 07/26/2019 18  0 - 44 U/L Final  . Alkaline Phosphatase 07/26/2019 62  38 - 126 U/L Final  . Total Bilirubin 07/26/2019 0.4  0.3 - 1.2 mg/dL Final  . GFR calc non Af Amer 07/26/2019 52* >60 mL/min Final  . GFR calc Af Amer 07/26/2019 60* >60 mL/min Final  . Anion gap 07/26/2019 9  5 - 15 Final   Performed at [facility], [place] [address], [place], [place] [address]  . Prothrombin Time 07/26/2019 12.3  11.4 - 15.2 seconds Final  . INR 07/26/2019 0.9  0.8 - 1.2 Final   Comment: (NOTE) INR goal varies based on device and disease states. Performed at [facility], [place] [address] [place], [place] [address]   . ABO/RH(D) 07/26/2019 A POS   Final  . Antibody Screen 07/26/2019 NEG   Final  . Sample Expiration 07/26/2019 08/04/2019,2359   Final  . Extend sample reason 07/26/2019    Final                   Value:NO TRANSFUSIONS OR PREGNANCY IN THE PAST 3 MONTHS Performed at [facility] [address], [place], [place] [address]   . Hgb A1c MFr Bld 07/26/2019 6.9* 4.8 - 5.6 % Final   Comment: (NOTE) Pre diabetes:          5.7%-6.4% Diabetes:              >6.4% Glycemic control for   <7.0% adults with diabetes   . Mean Plasma  Glucose 07/26/2019 151.33  mg/dL Final   Performed at [facility] [address] [place], [place] [address]  . MRSA, PCR 07/26/2019 NEGATIVE  NEGATIVE Final  . Staphylococcus aureus 07/26/2019 NEGATIVE  NEGATIVE Final   Comment: (NOTE) The Xpert SA Assay (FDA approved for NASAL specimens in  patients 45 years of age and older), is one component of a comprehensive surveillance program. It is not intended to diagnose infection nor to guide or monitor treatment. Performed at [facility], [place] [address], [place], [place] [address]   . Glucose-Capillary 07/26/2019 142* 70 - 99 mg/dL Final     X-Rays:Dg Pelvis Portable  Result Date: 08/01/2019 CLINICAL DATA:  83 year old female status post right hip replacement. Postoperative radiograph PACU. EXAM: PORTABLE PELVIS 1-2 VIEWS COMPARISON:  Earlier radiograph dated 08/01/2019 FINDINGS: There is a total right hip arthroplasty. The arthroplasty components appear intact and in anatomic alignment. No evidence of loosening. No acute fracture or dislocation. Small pockets of soft tissue air in the right thigh, postsurgical. A drainage catheter is partially visualized. IMPRESSION: Right total hip arthroplasty.  No complications. Electronically Signed   By: Anner Crete M.D.   On: 08/01/2019 10:48   Dg C-arm 1-60 Min-no Report  Result Date: 08/01/2019 Fluoroscopy was utilized by the requesting physician.  No radiographic interpretation.   Dg Hip Operative Unilat W Or W/o Pelvis Right  Result Date: 08/01/2019 CLINICAL DATA:  Surgery EXAM: OPERATIVE RIGHT HIP (WITH PELVIS IF PERFORMED) 3 VIEWS TECHNIQUE: Fluoroscopic spot image(s) were submitted for interpretation post-operatively. COMPARISON:  None. FINDINGS: Changes of right hip replacement. Normal AP alignment. No hardware bony complicating feature. IMPRESSION: Right hip replacement.  No complicating feature. Electronically Signed   By: Rolm Baptise M.D.   On: 08/01/2019 10:02     EKG: Orders placed or performed during the hospital encounter of 05/25/19  . EKG 12 lead  . EKG 12 lead     Hospital Course: Angelica Wood is a 83 y.o. who was admitted to [facility]. They were brought to the operating room on 08/01/2019 and underwent Procedure(s): [place].  Patient tolerated the procedure well and was later transferred to the recovery room and then to the orthopaedic floor for postoperative care. They were given PO and IV analgesics for pain control following their surgery. They were given 24 hours of postoperative antibiotics of  Anti-infectives (From admission, onward)   Start     Dose/Rate Route Frequency Ordered Stop   08/02/19 1000  minocycline (MINOCIN) capsule 100 mg  Status:  Discontinued     100 mg Oral Daily 08/01/19 1213 08/04/19 1913   08/01/19 1400  ceFAZolin (ANCEF) IVPB 2g/100 mL premix     2 g 200 mL/hr over 30 Minutes Intravenous Every 6 hours 08/01/19 1213 08/01/19 2239   08/01/19 0645  ceFAZolin (ANCEF) IVPB 2g/100 mL premix     2 g 200 mL/hr over 30 Minutes Intravenous On call to O.R. 08/01/19 [id number] 08/01/19 0857     and started on DVT prophylaxis in the form of Xarelto.   PT and OT were ordered for total joint protocol. Discharge planning consulted to help with postop disposition and equipment needs. Patient had a fair night on the evening of surgery. She was unable to progress with therapy secondary to pain. They started to get up OOB with therapy on POD #1. Hemovac drain was pulled without difficulty on day one. HHPT was ordered to facilitate safe discharge home. Continued to work with therapy into POD #2. Patient had multiple episodes of hypoglycemia, and hospitalist service was consulted for assistance with diabetic management. Pt was seen during rounds on day three and was ready to go home pending progress with therapy. Hospitalist team made recommendations for changes to patient's home insulin regimen, and  recommended follow up with PCP. Dressing was changed and the incision was clean and intact. Pt worked with therapy for two additional sessions and was meeting their goals. She was discharged to home later that day in stable condition.  Diet: Diabetic diet Activity: WBAT Follow-up: in 2 weeks Disposition: Home Discharged Condition: good   Discharge Instructions    Call MD / Call 911   Complete by: As directed    If you experience chest pain or shortness of breath, CALL 911 and be transported to the hospital emergency room.  If you develope a fever above 101 F, pus (white drainage) or increased drainage or redness at the wound, or calf pain, call your surgeon's office.   Constipation Prevention   Complete by: As directed    Drink plenty of fluids.  Prune juice may be helpful.  You may use a stool softener, such as Colace (over the counter) 100 mg twice a day.  Use MiraLax (over the counter) for constipation as needed.   Diet - low sodium heart healthy   Complete by: As directed    Diet Carb Modified   Complete by: As directed    Discharge instructions   Complete by: As directed    1)Reduce Levemir insulin to only 10 units at bedtime starting on Sunday, 08/05/2019 (please do not take the 65 units you were  taking previously as your blood sugars will get too low) 2) take NovoLog insulin 3 units  with meals 3 times a day 3) please follow-up with your primary care physician for further adjustment of your insulin regimen 4) please do not restart Ozempic   Dr. Gaynelle Arabian Total Joint Specialist [facility] [place], [place] [address] [phone]  [place] DIRECTIONS   Hip Rehabilitation, Guidelines Following Surgery  The results of a hip operation are greatly improved after range of motion and muscle strengthening exercises. Follow all safety measures which are given to protect your hip. If any of these exercises  cause increased pain or swelling in your joint, decrease the amount until you are comfortable again. Then slowly increase the exercises. Call your caregiver if you have problems or questions.   HOME CARE INSTRUCTIONS  Remove items at home which could result in a fall. This includes throw rugs or furniture in walking pathways.  ICE to the affected hip every three hours for 30 minutes at a time and then as needed for pain and swelling.  Continue to use ice on the hip for pain and swelling from surgery. You may notice swelling that will progress down to the foot and ankle.  This is normal after surgery.  Elevate the leg when you are not up walking on it.   Continue to use the breathing machine which will help keep your temperature down.  It is common for your temperature to cycle up and down following surgery, especially at night when you are not up moving around and exerting yourself.  The breathing machine keeps your lungs expanded and your temperature down.  DIET You may resume your previous home diet once your are discharged from the hospital.  DRESSING / WOUND CARE / SHOWERING You may change your dressing 3-5 days after surgery.  Then change the dressing every day with sterile gauze.  Please use good hand washing techniques before changing the dressing.  Do not use any lotions or creams on the incision until instructed by your surgeon. You may start showering once  you are discharged home but do not submerge the incision under water. Just pat the incision dry and apply a dry gauze dressing on daily. Change the surgical dressing daily and reapply a dry dressing each time.  ACTIVITY Walk with your walker as instructed. Use walker as long as suggested by your caregivers. Avoid periods of inactivity such as sitting longer than an hour when not asleep. This helps prevent blood clots.  You may resume a sexual relationship in one month or when given the OK by your doctor.  You may return to work once  you are cleared by your doctor.  Do not drive a car for 6 weeks or until released by you surgeon.  Do not drive while taking narcotics.  WEIGHT BEARING Weight bearing as tolerated with assist device (walker, cane, etc) as directed, use it as long as suggested by your surgeon or therapist, typically at least 4-6 weeks.  POSTOPERATIVE CONSTIPATION PROTOCOL Constipation - defined medically as fewer than three stools per week and severe constipation as less than one stool per week.  One of the most common issues patients have following surgery is constipation.  Even if you have a regular bowel pattern at home, your normal regimen is likely to be disrupted due to multiple reasons following surgery.  Combination of anesthesia, postoperative narcotics, change in appetite and fluid intake all can affect your bowels.  In order to avoid complications following surgery, here are some recommendations in order to help you during your recovery period.  Colace (docusate) - Pick up an over-the-counter form of Colace or another stool softener and take twice a day as long as you are requiring postoperative pain medications.  Take with a full glass of water daily.  If you experience loose stools or diarrhea, hold the colace until you stool forms back up.  If your symptoms do not get better within 1 week or if they get worse, check with your doctor.  Dulcolax (bisacodyl) - Pick up over-the-counter and take as directed by the product packaging as needed to assist with the movement of your bowels.  Take with a full glass of water.  Use this product as needed if not relieved by Colace only.   MiraLax (polyethylene glycol) - Pick up over-the-counter to have on hand.  MiraLax is a solution that will increase the amount of water in your bowels to assist with bowel movements.  Take as directed and can mix with a glass of water, juice, soda, coffee, or tea.  Take if you go more than two days without a movement. Do not use  MiraLax more than once per day. Call your doctor if you are still constipated or irregular after using this medication for 7 days in a row.  If you continue to have problems with postoperative constipation, please contact the office for further assistance and recommendations.  If you experience "the worst abdominal pain ever" or develop nausea or vomiting, please contact the office immediatly for further recommendations for treatment.  ITCHING  If you experience itching with your medications, try taking only a single pain pill, or even half a pain pill at a time.  You can also use Benadryl over the counter for itching or also to help with sleep.   TED HOSE STOCKINGS Wear the elastic stockings on both legs for three weeks following surgery during the day but you may remove then at night for sleeping.  MEDICATIONS See your medication summary on the "After Visit Summary" that the nursing  staff will review with you prior to discharge.  You may have some home medications which will be placed on hold until you complete the course of blood thinner medication.  It is important for you to complete the blood thinner medication as prescribed by your surgeon.  Continue your approved medications as instructed at time of discharge.  PRECAUTIONS If you experience chest pain or shortness of breath - call 911 immediately for transfer to the hospital emergency department.  If you develop a fever greater that 101 F, purulent drainage from wound, increased redness or drainage from wound, foul odor from the wound/dressing, or calf pain - CONTACT YOUR SURGEON.                                                   FOLLOW-UP APPOINTMENTS Make sure you keep all of your appointments after your operation with your surgeon and caregivers. You should call the office at the above phone number and make an appointment for approximately two weeks after the date of your surgery or on the date instructed by your surgeon outlined in the  "After Visit Summary".  RANGE OF MOTION AND STRENGTHENING EXERCISES  These exercises are designed to help you keep full movement of your hip joint. Follow your caregiver's or physical therapist's instructions. Perform all exercises about fifteen times, three times per day or as directed. Exercise both hips, even if you have had only one joint replacement. These exercises can be done on a training (exercise) mat, on the floor, on a table or on a bed. Use whatever works the best and is most comfortable for you. Use music or television while you are exercising so that the exercises are a pleasant break in your day. This will make your life better with the exercises acting as a break in routine you can look forward to.  Lying on your back, slowly slide your foot toward your buttocks, raising your knee up off the floor. Then slowly slide your foot back down until your leg is straight again.  Lying on your back spread your legs as far apart as you can without causing discomfort.  Lying on your side, raise your upper leg and foot straight up from the floor as far as is comfortable. Slowly lower the leg and repeat.  Lying on your back, tighten up the muscle in the front of your thigh (quadriceps muscles). You can do this by keeping your leg straight and trying to raise your heel off the floor. This helps strengthen the largest muscle supporting your knee.  Lying on your back, tighten up the muscles of your buttocks both with the legs straight and with the knee bent at a comfortable angle while keeping your heel on the floor.   IF YOU ARE TRANSFERRED TO A SKILLED REHAB FACILITY If the patient is transferred to a skilled rehab facility following release from the hospital, a list of the current medications will be sent to the facility for the patient to continue.  When discharged from the skilled rehab facility, please have the facility set up the patient's [place] prior to being released. Also,  the skilled facility will be responsible for providing the patient with their medications at time of release from the facility to include their pain medication, the muscle relaxants, and their blood thinner medication. If the  patient is still at the rehab facility at time of the two week follow up appointment, the skilled rehab facility will also need to assist the patient in arranging follow up appointment in our office and any transportation needs.  MAKE SURE YOU:  Understand these instructions.  Get help right away if you are not doing well or get worse.    Pick up stool softner and laxative for home use following surgery while on pain medications. Do not submerge incision under water. Please use good hand washing techniques while changing dressing each day. May shower starting three days after surgery. Please use a clean towel to pat the incision dry following showers. Continue to use ice for pain and swelling after surgery. Do not use any lotions or creams on the incision until instructed by your surgeon.   Increase activity slowly as tolerated   Complete by: As directed      Allergies as of 08/04/2019      Reactions   Gabapentin Nausea And Vomiting, Other (See Comments)   Unable to urinate   Adenosine Other (See Comments)   Unknown rxn   Glucophage [metformin Hydrochloride] Diarrhea   Meloxicam Nausea Only   Dizziness   Prednisone Nausea And Vomiting   Ace Inhibitors Other (See Comments)   Cough   Clindamycin Hcl Rash   All over torso    Macrodantin [nitrofurantoin Macrocrystal] Rash   Welts   Sulfa Antibiotics Rash   Welts   Tramadol Rash      Medication List    STOP taking these medications   Multivitamin Gummies Womens Chew   oxyCODONE-acetaminophen 5-325 MG tablet Commonly known as: PERCOCET/ROXICET   tiZANidine 4 MG capsule Commonly known as: Zanaflex     TAKE these medications   albuterol 108 (90 Base) MCG/ACT inhaler Commonly known as: VENTOLIN HFA  Inhale 2 puffs into the lungs every 6 (six) hours as needed for wheezing.   clobetasol cream 0.05 % Commonly known as: TEMOVATE Apply 1 application topically daily as needed (irritation).   DULoxetine 60 MG capsule Commonly known as: CYMBALTA Take 60 mg by mouth 2 (two) times daily.   HYDROcodone-acetaminophen 5-325 MG tablet Commonly known as: NORCO/VICODIN Take 1-2 tablets by mouth every 6 (six) hours as needed for moderate pain (pain score 4-6).   insulin detemir 100 UNIT/ML injection Commonly known as: LEVEMIR Inject 0.1 mLs (10 Units total) into the skin at bedtime. Start on Sunday 08/05/19 at bedtime What changed:   how much to take  when to take this  additional instructions   insulin lispro 100 UNIT/ML KwikPen Commonly known as: HumaLOG KwikPen Inject 0.03 mLs (3 Units total) into the skin 3 (three) times daily with meals.   levothyroxine 100 MCG tablet Commonly known as: SYNTHROID Take 100 mcg by mouth daily before breakfast.   LORazepam 0.5 MG tablet Commonly known as: ATIVAN Take 0.5 mg by mouth daily as needed for anxiety.   losartan-hydrochlorothiazide 100-12.5 MG tablet Commonly known as: HYZAAR Take 1 tablet by mouth daily.   methocarbamol 500 MG tablet Commonly known as: ROBAXIN Take 1 tablet (500 mg total) by mouth every 6 (six) hours as needed for muscle spasms.   metoprolol tartrate 50 MG tablet Commonly known as: LOPRESSOR Take 50 mg by mouth daily.   minocycline 100 MG capsule Commonly known as: MINOCIN Take 100 mg by mouth daily.   montelukast 10 MG tablet Commonly known as: SINGULAIR Take 10 mg by mouth daily.   rivaroxaban 10 MG Tabs  tablet Commonly known as: XARELTO Take 1 tablet (10 mg total) by mouth daily.   triamcinolone cream 0.1 % Commonly known as: KENALOG Apply 1 application topically 2 (two) times daily as needed (irritation).      Follow-up Information    Gaynelle Arabian, MD. Schedule an appointment as soon as  possible for a visit on 08/16/2019.   Specialty: Orthopedic Surgery Contact information: [address] [place] [address] [phone]        [facility] Follow up.   Specialty: Home Health Services Why: agency will provide home health physical therapy, agency will call you to schedule first visit. Contact information: [place] [phone]           Signed: Griffith Citron, PA-C Orthopedic Surgery 08/07/2019, 10:43 AM

## 2019-08-15 DIAGNOSIS — Z96641 Presence of right artificial hip joint: Secondary | ICD-10-CM | POA: Insufficient documentation

## 2019-09-15 DIAGNOSIS — Z6826 Body mass index (BMI) 26.0-26.9, adult: Secondary | ICD-10-CM | POA: Insufficient documentation

## 2019-12-13 DIAGNOSIS — R5383 Other fatigue: Secondary | ICD-10-CM | POA: Insufficient documentation

## 2019-12-18 DIAGNOSIS — D519 Vitamin B12 deficiency anemia, unspecified: Secondary | ICD-10-CM | POA: Insufficient documentation

## 2020-01-18 DIAGNOSIS — E538 Deficiency of other specified B group vitamins: Secondary | ICD-10-CM | POA: Insufficient documentation

## 2020-03-05 DIAGNOSIS — M542 Cervicalgia: Secondary | ICD-10-CM | POA: Insufficient documentation

## 2020-03-18 DIAGNOSIS — M542 Cervicalgia: Secondary | ICD-10-CM | POA: Diagnosis not present

## 2020-03-24 DIAGNOSIS — M542 Cervicalgia: Secondary | ICD-10-CM | POA: Diagnosis not present

## 2020-03-25 DIAGNOSIS — E782 Mixed hyperlipidemia: Secondary | ICD-10-CM | POA: Diagnosis not present

## 2020-03-25 DIAGNOSIS — I10 Essential (primary) hypertension: Secondary | ICD-10-CM | POA: Diagnosis not present

## 2020-03-25 DIAGNOSIS — E890 Postprocedural hypothyroidism: Secondary | ICD-10-CM | POA: Diagnosis not present

## 2020-03-25 DIAGNOSIS — E119 Type 2 diabetes mellitus without complications: Secondary | ICD-10-CM | POA: Diagnosis not present

## 2020-04-08 DIAGNOSIS — E119 Type 2 diabetes mellitus without complications: Secondary | ICD-10-CM | POA: Diagnosis not present

## 2020-04-08 DIAGNOSIS — H401131 Primary open-angle glaucoma, bilateral, mild stage: Secondary | ICD-10-CM | POA: Diagnosis not present

## 2020-04-11 DIAGNOSIS — I10 Essential (primary) hypertension: Secondary | ICD-10-CM | POA: Diagnosis not present

## 2020-04-11 DIAGNOSIS — E039 Hypothyroidism, unspecified: Secondary | ICD-10-CM | POA: Diagnosis not present

## 2020-04-15 DIAGNOSIS — M1712 Unilateral primary osteoarthritis, left knee: Secondary | ICD-10-CM | POA: Insufficient documentation

## 2020-05-06 DIAGNOSIS — K921 Melena: Secondary | ICD-10-CM | POA: Insufficient documentation

## 2020-06-20 DIAGNOSIS — H401131 Primary open-angle glaucoma, bilateral, mild stage: Secondary | ICD-10-CM | POA: Diagnosis not present

## 2020-07-02 DIAGNOSIS — M2392 Unspecified internal derangement of left knee: Secondary | ICD-10-CM | POA: Diagnosis not present

## 2020-07-02 DIAGNOSIS — M5137 Other intervertebral disc degeneration, lumbosacral region: Secondary | ICD-10-CM | POA: Diagnosis not present

## 2020-07-02 DIAGNOSIS — M532X7 Spinal instabilities, lumbosacral region: Secondary | ICD-10-CM | POA: Diagnosis not present

## 2020-07-02 DIAGNOSIS — M545 Low back pain: Secondary | ICD-10-CM | POA: Diagnosis not present

## 2020-07-02 DIAGNOSIS — M62838 Other muscle spasm: Secondary | ICD-10-CM | POA: Diagnosis not present

## 2020-07-02 DIAGNOSIS — M9903 Segmental and somatic dysfunction of lumbar region: Secondary | ICD-10-CM | POA: Diagnosis not present

## 2020-07-02 DIAGNOSIS — M9905 Segmental and somatic dysfunction of pelvic region: Secondary | ICD-10-CM | POA: Diagnosis not present

## 2020-07-02 DIAGNOSIS — M5416 Radiculopathy, lumbar region: Secondary | ICD-10-CM | POA: Diagnosis not present

## 2020-07-02 DIAGNOSIS — M5136 Other intervertebral disc degeneration, lumbar region: Secondary | ICD-10-CM | POA: Diagnosis not present

## 2020-07-03 DIAGNOSIS — E119 Type 2 diabetes mellitus without complications: Secondary | ICD-10-CM | POA: Diagnosis not present

## 2020-07-03 DIAGNOSIS — I10 Essential (primary) hypertension: Secondary | ICD-10-CM | POA: Diagnosis not present

## 2020-07-03 DIAGNOSIS — E890 Postprocedural hypothyroidism: Secondary | ICD-10-CM | POA: Diagnosis not present

## 2020-07-03 DIAGNOSIS — E782 Mixed hyperlipidemia: Secondary | ICD-10-CM | POA: Diagnosis not present

## 2020-07-06 DIAGNOSIS — Z794 Long term (current) use of insulin: Secondary | ICD-10-CM | POA: Diagnosis not present

## 2020-07-06 DIAGNOSIS — E1165 Type 2 diabetes mellitus with hyperglycemia: Secondary | ICD-10-CM | POA: Diagnosis not present

## 2020-07-31 ENCOUNTER — Other Ambulatory Visit: Payer: Self-pay

## 2020-07-31 ENCOUNTER — Encounter (HOSPITAL_COMMUNITY): Payer: Self-pay | Admitting: Emergency Medicine

## 2020-07-31 ENCOUNTER — Emergency Department (HOSPITAL_COMMUNITY)
Admission: EM | Admit: 2020-07-31 | Discharge: 2020-07-31 | Disposition: A | Discharge status: Home / Self Care | Payer: Medicare Other | Attending: Emergency Medicine | Admitting: Emergency Medicine

## 2020-07-31 ENCOUNTER — Emergency Department (HOSPITAL_COMMUNITY): Payer: Medicare Other

## 2020-07-31 DIAGNOSIS — W19XXXA Unspecified fall, initial encounter: Secondary | ICD-10-CM

## 2020-07-31 DIAGNOSIS — E162 Hypoglycemia, unspecified: Secondary | ICD-10-CM | POA: Diagnosis not present

## 2020-07-31 DIAGNOSIS — R410 Disorientation, unspecified: Secondary | ICD-10-CM | POA: Insufficient documentation

## 2020-07-31 DIAGNOSIS — Z79899 Other long term (current) drug therapy: Secondary | ICD-10-CM | POA: Diagnosis not present

## 2020-07-31 DIAGNOSIS — R5383 Other fatigue: Secondary | ICD-10-CM | POA: Insufficient documentation

## 2020-07-31 DIAGNOSIS — J9811 Atelectasis: Secondary | ICD-10-CM | POA: Diagnosis not present

## 2020-07-31 DIAGNOSIS — Z87891 Personal history of nicotine dependence: Secondary | ICD-10-CM | POA: Insufficient documentation

## 2020-07-31 DIAGNOSIS — I10 Essential (primary) hypertension: Secondary | ICD-10-CM | POA: Insufficient documentation

## 2020-07-31 DIAGNOSIS — I4510 Unspecified right bundle-branch block: Secondary | ICD-10-CM | POA: Diagnosis not present

## 2020-07-31 DIAGNOSIS — Z794 Long term (current) use of insulin: Secondary | ICD-10-CM | POA: Insufficient documentation

## 2020-07-31 DIAGNOSIS — R001 Bradycardia, unspecified: Secondary | ICD-10-CM | POA: Diagnosis not present

## 2020-07-31 DIAGNOSIS — Z20822 Contact with and (suspected) exposure to covid-19: Secondary | ICD-10-CM | POA: Insufficient documentation

## 2020-07-31 DIAGNOSIS — S0990XA Unspecified injury of head, initial encounter: Secondary | ICD-10-CM | POA: Diagnosis not present

## 2020-07-31 DIAGNOSIS — E11649 Type 2 diabetes mellitus with hypoglycemia without coma: Secondary | ICD-10-CM | POA: Diagnosis not present

## 2020-07-31 DIAGNOSIS — E039 Hypothyroidism, unspecified: Secondary | ICD-10-CM | POA: Insufficient documentation

## 2020-07-31 DIAGNOSIS — E119 Type 2 diabetes mellitus without complications: Secondary | ICD-10-CM | POA: Insufficient documentation

## 2020-07-31 DIAGNOSIS — Z96641 Presence of right artificial hip joint: Secondary | ICD-10-CM | POA: Insufficient documentation

## 2020-07-31 DIAGNOSIS — Z7951 Long term (current) use of inhaled steroids: Secondary | ICD-10-CM | POA: Insufficient documentation

## 2020-07-31 DIAGNOSIS — R4182 Altered mental status, unspecified: Secondary | ICD-10-CM | POA: Diagnosis present

## 2020-07-31 DIAGNOSIS — I499 Cardiac arrhythmia, unspecified: Secondary | ICD-10-CM | POA: Diagnosis not present

## 2020-07-31 DIAGNOSIS — Z743 Need for continuous supervision: Secondary | ICD-10-CM | POA: Diagnosis not present

## 2020-07-31 LAB — BASIC METABOLIC PANEL
Anion gap: 11 (ref 5–15)
BUN: 29 mg/dL — ABNORMAL HIGH (ref 8–23)
CO2: 24 mmol/L (ref 22–32)
Calcium: 8.9 mg/dL (ref 8.9–10.3)
Chloride: 101 mmol/L (ref 98–111)
Creatinine, Ser: 0.88 mg/dL (ref 0.44–1.00)
GFR calc Af Amer: >60 mL/min (ref >60)
GFR calc non Af Amer: >60 mL/min (ref >60)
Glucose, Bld: 262 mg/dL — ABNORMAL HIGH (ref 70–99)
Potassium: 4.5 mmol/L (ref 3.5–5.1)
Sodium: 136 mmol/L (ref 135–145)

## 2020-07-31 LAB — SARS CORONAVIRUS 2 BY RT PCR (HOSPITAL ORDER, PERFORMED IN ~~LOC~~ HOSPITAL LAB): SARS Coronavirus 2: NEGATIVE

## 2020-07-31 LAB — CBC WITH DIFFERENTIAL/PLATELET
Abs Immature Granulocytes: 0.04 10*3/uL (ref 0.00–0.07)
Basophils Absolute: 0 10*3/uL (ref 0.0–0.1)
Basophils Relative: 0 %
Eosinophils Absolute: 0.1 10*3/uL (ref 0.0–0.5)
Eosinophils Relative: 1 %
HCT: 40.4 % (ref 36.0–46.0)
Hemoglobin: 12.8 g/dL (ref 12.0–15.0)
Immature Granulocytes: 1 %
Lymphocytes Relative: 13 %
Lymphs Abs: 1.1 10*3/uL (ref 0.7–4.0)
MCH: 28.3 pg (ref 26.0–34.0)
MCHC: 31.7 g/dL (ref 30.0–36.0)
MCV: 89.4 fL (ref 80.0–100.0)
Monocytes Absolute: 0.5 10*3/uL (ref 0.1–1.0)
Monocytes Relative: 6 %
Neutro Abs: 6.6 10*3/uL (ref 1.7–7.7)
Neutrophils Relative %: 79 %
Platelets: 191 10*3/uL (ref 150–400)
RBC: 4.52 MIL/uL (ref 3.87–5.11)
RDW: 13.2 % (ref 11.5–15.5)
WBC: 8.4 10*3/uL (ref 4.0–10.5)
nRBC: 0 % (ref 0.0–0.2)

## 2020-07-31 LAB — TROPONIN I (HIGH SENSITIVITY)
Troponin I (High Sensitivity): 9 ng/L (ref <18)
Troponin I (High Sensitivity): 10 ng/L (ref <18)

## 2020-07-31 LAB — URINALYSIS, ROUTINE W REFLEX MICROSCOPIC
Bilirubin Urine: NEGATIVE
Glucose, UA: 50 mg/dL — AB
Hgb urine dipstick: NEGATIVE
Ketones, ur: NEGATIVE mg/dL
Nitrite: NEGATIVE
Protein, ur: NEGATIVE mg/dL
Specific Gravity, Urine: 1.006 (ref 1.005–1.030)
pH: 5 (ref 5.0–8.0)

## 2020-07-31 LAB — CBG MONITORING, ED: Glucose-Capillary: 121 mg/dL — ABNORMAL HIGH (ref 70–99)

## 2020-07-31 NOTE — ED Triage Notes (Signed)
Pt grandson called out for fall and altered mental status. Pt was laying on floor of bathroom yelling out for grandson. CBG on scene was 68. Oral glucose given. Per EMS pt was diaphoretic on arrival. Pt c/o "not feeling well." HR enroute was 40s-50s. Pt has hx of HTN.

## 2020-07-31 NOTE — ED Provider Notes (Signed)
[facility] EMERGENCY DEPARTMENT Provider Note   CSN: [id number] Arrival date & time: 07/31/20  0445     History Chief Complaint  Patient presents with  . Fall    Angelica Wood is a 84 y.o. female.  Patient presents to the emergency department for evaluation of a fall with altered mental status. Patient's grandson reports that he heard her yelling out and found her on the floor in the bathroom. She could not get up and seemed confused and agitated. EMS was called to the home. They report that the patient was hypoglycemic and diaphoretic. Patient was given oral glucose and seems to have improved. At arrival to the ER she is awake, alert and oriented. She reports that she does not really remember what happened, however. She denies chest pain, shortness of breath, heart palpitations, musculoskeletal pain, headache, dizziness. She reports that she felt well all day and went to bed feeling like her normal self.        Past Medical History:  Diagnosis Date  . Anemia    hx of  . Arthritis   . Bronchitis    hx of  . Chronic fatigue   . Diabetes mellitus   . Fibromyalgia   . GERD (gastroesophageal reflux disease)   . Hypertension    [name]  . Hypothyroidism   . Irritable bowel syndrome (IBS)    hx of  . Migraine    hx of  . Pneumonia    hx of  . PONV (postoperative nausea and vomiting)   . Shortness of breath    with exertion  . Sleep apnea    Pt denies  . Thyroid disease   . Urgency of urination   . Urinary incontinence   . Vertigo    hx of    Patient Active Problem List   Diagnosis Date Noted  . Type 2 diabetes mellitus with other specified complication ([place]) 28/41/3244  . HTN (hypertension) 08/04/2019  . Hypothyroidism 08/04/2019  . OA (osteoarthritis) of hip 08/01/2019  . H/O total shoulder replacement 02/27/2013  . Pain in joint, shoulder region 02/27/2013  . Muscle weakness (generalized) 02/27/2013  . Shoulder arthritis 06/30/2012    Past Surgical  History:  Procedure Laterality Date  . ABDOMINAL HYSTERECTOMY    . APPENDECTOMY    . CHOLECYSTECTOMY    . COLONOSCOPY W/ POLYPECTOMY    . DILATION AND CURETTAGE OF UTERUS    . EYE SURGERY     right cataract removed, laser surgery for glaucoma  . JOINT REPLACEMENT Right    thumb  . TONSILLECTOMY    . TOTAL HIP ARTHROPLASTY Right 08/01/2019   Procedure: TOTAL HIP ARTHROPLASTY ANTERIOR APPROACH;  Surgeon: Gaynelle Arabian, MD;  Location: [facility];  Service: Orthopedics;  Laterality: Right;  171min  . TOTAL SHOULDER ARTHROPLASTY  06/29/2012   Procedure: TOTAL SHOULDER ARTHROPLASTY;  Surgeon: Marin Shutter, MD;  Location: [facility];  Service: Orthopedics;  Laterality: Left;  left total shoulder arthroplasty  . TOTAL SHOULDER ARTHROPLASTY Right 02/08/2013   Procedure: TOTAL SHOULDER ARTHROPLASTY;  Surgeon: Marin Shutter, MD;  Location: [facility];  Service: Orthopedics;  Laterality: Right;     OB History   No obstetric history on file.     History reviewed. No pertinent family history.  Social History   Tobacco Use  . Smoking status: Former Smoker    Packs/day: 0.25    Years: 4.00    Pack years: 1.00    Types: Cigarettes    Quit  date: 74    Years since quitting: 51.6  . Smokeless tobacco: Never Used  Vaping Use  . Vaping Use: Never used  Substance Use Topics  . Alcohol use: No  . Drug use: No    Home Medications Prior to Admission medications   Medication Sig Start Date End Date Taking? Authorizing Provider  albuterol (PROVENTIL HFA;VENTOLIN HFA) 108 (90 BASE) MCG/ACT inhaler Inhale 2 puffs into the lungs every 6 (six) hours as needed for wheezing.    [provider]  clobetasol cream (TEMOVATE) 1.28 % Apply 1 application topically daily as needed (irritation).     [provider]  DULoxetine (CYMBALTA) 60 MG capsule Take 60 mg by mouth 2 (two) times daily.    [provider]  HYDROcodone-acetaminophen (NORCO/VICODIN) 5-325 MG tablet Take 1-2 tablets by  mouth every 6 (six) hours as needed for moderate pain (pain score 4-6). 08/02/19   Constable, Amber, PA-C  insulin detemir (LEVEMIR) 100 UNIT/ML injection Inject 0.1 mLs (10 Units total) into the skin at bedtime. Start on Sunday 08/05/19 at bedtime 08/05/19   Emokpae, Courage, MD  insulin lispro (HUMALOG KWIKPEN) 100 UNIT/ML KwikPen Inject 0.03 mLs (3 Units total) into the skin 3 (three) times daily with meals. 08/04/19   Roxan Hockey, MD  levothyroxine (SYNTHROID, LEVOTHROID) 100 MCG tablet Take 100 mcg by mouth daily before breakfast.     [provider]  LORazepam (ATIVAN) 0.5 MG tablet Take 0.5 mg by mouth daily as needed for anxiety.    [provider]  losartan-hydrochlorothiazide (HYZAAR) 100-12.5 MG per tablet Take 1 tablet by mouth daily.    [provider]  methocarbamol (ROBAXIN) 500 MG tablet Take 1 tablet (500 mg total) by mouth every 6 (six) hours as needed for muscle spasms. 08/02/19   Constable, Amber, PA-C  metoprolol (LOPRESSOR) 50 MG tablet Take 50 mg by mouth daily.     [provider]  minocycline (MINOCIN) 100 MG capsule Take 100 mg by mouth daily.     [provider]  montelukast (SINGULAIR) 10 MG tablet Take 10 mg by mouth daily.     [provider]  triamcinolone cream (KENALOG) 0.1 % Apply 1 application topically 2 (two) times daily as needed (irritation).     [provider]  rivaroxaban (XARELTO) 10 MG TABS tablet Take 1 tablet (10 mg total) by mouth daily. 08/02/19 07/31/20  Constable, Amber, PA-C    Allergies    Gabapentin, Adenosine, Glucophage [metformin hydrochloride], Meloxicam, Prednisone, Ace inhibitors, Clindamycin hcl, Macrodantin [nitrofurantoin macrocrystal], Sulfa antibiotics, and Tramadol  Review of Systems   Review of Systems  Constitutional: Positive for diaphoresis and fatigue.  Psychiatric/Behavioral: Positive for confusion.  All other systems reviewed and are negative.   Physical  Exam Updated Vital Signs BP (!) 130/53   Pulse (!) 56   Temp 98.1 F (36.7 C) (Oral)   Resp 14   Ht 5\' 5"  (1.651 m)   Wt 80.7 kg   SpO2 100%   BMI 29.62 kg/m   Physical Exam Vitals and nursing note reviewed.  Constitutional:      General: She is not in acute distress.    Appearance: Normal appearance. She is well-developed.  HENT:     Head: Normocephalic and atraumatic.     Right Ear: Hearing normal.     Left Ear: Hearing normal.     Nose: Nose normal.  Eyes:     Conjunctiva/sclera: Conjunctivae normal.     Pupils: Pupils are equal, round,  and reactive to light.  Cardiovascular:     Rate and Rhythm: Regular rhythm.     Heart sounds: S1 normal and S2 normal. No murmur heard.  No friction rub. No gallop.   Pulmonary:     Effort: Pulmonary effort is normal. No respiratory distress.     Breath sounds: Normal breath sounds.  Chest:     Chest wall: No tenderness.  Abdominal:     General: Bowel sounds are normal.     Palpations: Abdomen is soft.     Tenderness: There is no abdominal tenderness. There is no guarding or rebound. Negative signs include Murphy's sign and McBurney's sign.     Hernia: No hernia is present.  Musculoskeletal:        General: Normal range of motion.     Cervical back: Normal range of motion and neck supple.  Skin:    General: Skin is warm and dry.     Findings: No rash.  Neurological:     Mental Status: She is alert and oriented to person, place, and time.     GCS: GCS eye subscore is 4. GCS verbal subscore is 5. GCS motor subscore is 6.     Cranial Nerves: No cranial nerve deficit.     Sensory: No sensory deficit.     Coordination: Coordination normal.  Psychiatric:        Speech: Speech normal.        Behavior: Behavior normal.        Thought Content: Thought content normal.     ED Results / Procedures / Treatments   Labs (all labs ordered are listed, but only abnormal results are displayed) Labs Reviewed  BASIC METABOLIC PANEL -  Abnormal; Notable for the following components:      Result Value   Glucose, Bld 262 (*)    BUN 29 (*)    All other components within normal limits  CBG MONITORING, ED - Abnormal; Notable for the following components:   Glucose-Capillary 121 (*)    All other components within normal limits  SARS CORONAVIRUS 2 BY RT PCR (HOSPITAL ORDER, [facility])  CBC WITH DIFFERENTIAL/PLATELET  URINALYSIS, ROUTINE W REFLEX MICROSCOPIC  TROPONIN I (HIGH SENSITIVITY)  TROPONIN I (HIGH SENSITIVITY)    EKG EKG Interpretation  Date/Time:  Thursday July 31 2020 05:10:19 EDT Ventricular Rate:  53 PR Interval:    QRS Duration: 160 QT Interval:  536 QTC Calculation: 504 R Axis:   116 Text Interpretation: Sinus rhythm Atrial premature complex Right bundle branch block No significant change since last tracing Confirmed by Orpah Greek [phone]) on 07/31/2020 5:29:59 AM   Radiology CT HEAD WO CONTRAST  Result Date: 07/31/2020 CLINICAL DATA:  Fall with head injury.  Bradycardia EXAM: CT HEAD WITHOUT CONTRAST TECHNIQUE: Contiguous axial images were obtained from the base of the skull through the vertex without intravenous contrast. COMPARISON:  03/03/2020 FINDINGS: Brain: No evidence of acute infarction, hemorrhage, hydrocephalus, extra-axial collection or mass lesion/mass effect. Vascular: No hyperdense vessel or unexpected calcification. Skull: Normal. Negative for fracture or focal lesion. Sinuses/Orbits: No acute finding. IMPRESSION: Negative head CT. Electronically Signed   By: Monte Fantasia M.D.   On: 07/31/2020 05:44   DG Chest Port 1 View  Result Date: 07/31/2020 CLINICAL DATA:  Fall. EXAM: PORTABLE CHEST 1 VIEW COMPARISON:  03/03/2020. FINDINGS: Mediastinum and hilar structures normal. Heart size normal. Mild bibasilar atelectasis. No pleural effusion or pneumothorax. Thoracic spine scoliosis concave left. Bilateral shoulder  replacements. No acute bony  abnormality. IMPRESSION: 1.  Mild bibasilar atelectasis.  No pneumothorax. 2. Thoracic spine scoliosis concave left. Bilateral shoulder replacements. No acute bony abnormality identified. Electronically Signed   By: Marcello Moores  Register   On: 07/31/2020 05:42    Procedures Procedures (including critical care time)  Medications Ordered in ED Medications - No data to display  ED Course  I have reviewed the triage vital signs and the nursing notes.  Pertinent labs & imaging results that were available during my care of the patient were reviewed by me and considered in my medical decision making (see chart for details).    MDM Rules/Calculators/A&P                          Patient presents after a fall with confusion and diaphoresis in the setting of low blood sugar.  Patient was given oral glucose and symptoms resolved.  At the time of arrival to the ER she is awake, alert, oriented without any specific complaints.  She does report that she has had a similar episode when they changed her diabetes medications to drop her A1c so she can get hip surgery.  She has been monitored for an extended period of time here in the ER and has done well.  Blood work and x-rays including head CT are unremarkable.  She never had any chest pain but with the diaphoresis troponins were ordered.  Troponin was negative.  EKG is nonischemic, unchanged from previous.  She has had some borderline bradycardia but this has been seen before and she is on Lopressor.  Patient will be signed out to oncoming ER physician to follow-up on urinalysis to make sure she does not have a urinary tract infection.  She will then be ambulated and if she does well, likely can be discharged.  Final Clinical Impression(s) / ED Diagnoses Final diagnoses:  Fall, initial encounter  Hypoglycemia    Rx / DC Orders ED Discharge Orders    None       Trinka Keshishyan, Gwenyth Allegra, MD 07/31/20 [phone]

## 2020-07-31 NOTE — Discharge Instructions (Addendum)
You were evaluated in the Emergency Department and after careful evaluation, we did not find any emergent condition requiring admission or further testing in the hospital.  Your exam/testing today was overall reassuring.  Your symptoms seem to be due to an episode of low blood sugar.  Please call your primary care doctor office to discuss your insulin dosing and your blood sugar testing issues.  Please return to the Emergency Department if you experience any worsening of your condition.  Thank you for allowing [place] to be a part of your care.

## 2020-07-31 NOTE — ED Provider Notes (Signed)
  Provider Note MRN:  [id number]  Arrival date & time: 07/31/20    ED Course and Medical Decision Making  Assumed care from Dr. Waverly Ferrari at shift change.  Work-up is reassuring, urinalysis unremarkable, troponin negative x2.  Patient seems to be having trouble with her glucose measuring device, she will call the PCP today for assistance.  Appropriate for discharge.  Procedures  Final Clinical Impressions(s) / ED Diagnoses     ICD-10-CM   1. Fall, initial encounter  W19.XXXA   2. Hypoglycemia  E16.2     ED Discharge Orders    None        Discharge Instructions     You were evaluated in the Emergency Department and after careful evaluation, we did not find any emergent condition requiring admission or further testing in the hospital.  Your exam/testing today was overall reassuring.  Your symptoms seem to be due to an episode of low blood sugar.  Please call your primary care doctor office to discuss your insulin dosing and your blood sugar testing issues.  Please return to the Emergency Department if you experience any worsening of your condition.  Thank you for allowing [place] to be a part of your care.     Barth Kirks. [place] Small, [place] [email or web address]    Maudie Flakes, MD 07/31/20 [phone]

## 2020-08-05 DIAGNOSIS — Z794 Long term (current) use of insulin: Secondary | ICD-10-CM | POA: Diagnosis not present

## 2020-08-05 DIAGNOSIS — E1165 Type 2 diabetes mellitus with hyperglycemia: Secondary | ICD-10-CM | POA: Diagnosis not present

## 2020-08-09 IMAGING — DX PORTABLE PELVIS 1-2 VIEWS
1 series · 1 of 1 positions shown · non-contrast
Comparison: Earlier radiograph dated 08/01/2019

CLINICAL DATA: 83-year-old female status post right hip
replacement. Postoperative radiograph PACU.

EXAM:
PORTABLE PELVIS 1-2 VIEWS

[pelvis ap]
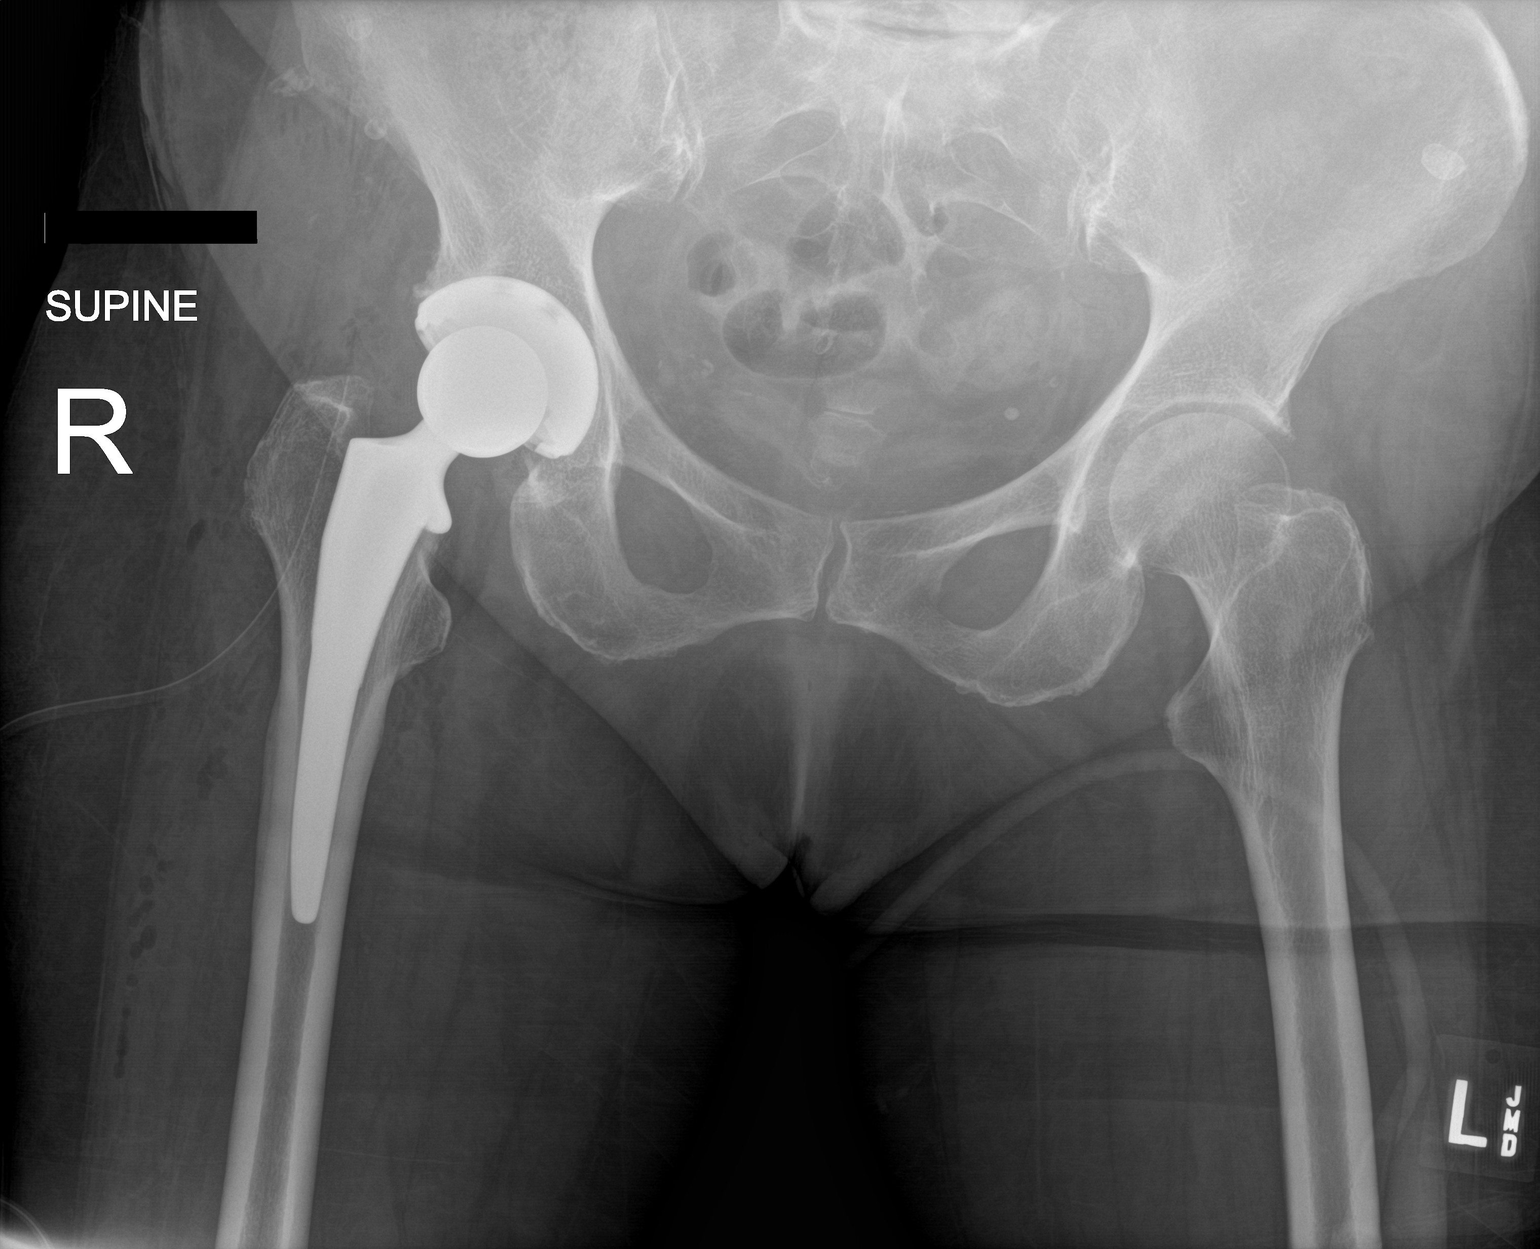

[1 of 1 positions shown; findings below may reference images not displayed]

FINDINGS: There is a total right hip arthroplasty. The arthroplasty components
appear intact and in anatomic alignment. No evidence of loosening.
No acute fracture or dislocation. Small pockets of soft tissue air
in the right thigh, postsurgical. A drainage catheter is partially
visualized.
IMPRESSION: Right total hip arthroplasty.  No complications.

## 2020-08-12 ENCOUNTER — Encounter (INDEPENDENT_AMBULATORY_CARE_PROVIDER_SITE_OTHER): Payer: Self-pay | Admitting: Gastroenterology

## 2020-08-12 DIAGNOSIS — I129 Hypertensive chronic kidney disease with stage 1 through stage 4 chronic kidney disease, or unspecified chronic kidney disease: Secondary | ICD-10-CM | POA: Diagnosis not present

## 2020-08-12 DIAGNOSIS — E1122 Type 2 diabetes mellitus with diabetic chronic kidney disease: Secondary | ICD-10-CM | POA: Diagnosis not present

## 2020-08-12 DIAGNOSIS — L298 Other pruritus: Secondary | ICD-10-CM | POA: Diagnosis not present

## 2020-08-12 DIAGNOSIS — G9009 Other idiopathic peripheral autonomic neuropathy: Secondary | ICD-10-CM | POA: Diagnosis not present

## 2020-08-12 DIAGNOSIS — N183 Chronic kidney disease, stage 3 unspecified: Secondary | ICD-10-CM | POA: Diagnosis not present

## 2020-08-12 DIAGNOSIS — E7849 Other hyperlipidemia: Secondary | ICD-10-CM | POA: Diagnosis not present

## 2020-08-12 DIAGNOSIS — L304 Erythema intertrigo: Secondary | ICD-10-CM | POA: Diagnosis not present

## 2020-08-12 DIAGNOSIS — L821 Other seborrheic keratosis: Secondary | ICD-10-CM | POA: Diagnosis not present

## 2020-08-26 DIAGNOSIS — H401131 Primary open-angle glaucoma, bilateral, mild stage: Secondary | ICD-10-CM | POA: Diagnosis not present

## 2020-09-04 ENCOUNTER — Telehealth (INDEPENDENT_AMBULATORY_CARE_PROVIDER_SITE_OTHER): Payer: Self-pay | Admitting: *Deleted

## 2020-09-04 ENCOUNTER — Other Ambulatory Visit (INDEPENDENT_AMBULATORY_CARE_PROVIDER_SITE_OTHER): Payer: Self-pay | Admitting: *Deleted

## 2020-09-04 ENCOUNTER — Encounter (INDEPENDENT_AMBULATORY_CARE_PROVIDER_SITE_OTHER): Payer: Self-pay | Admitting: Gastroenterology

## 2020-09-04 ENCOUNTER — Ambulatory Visit (INDEPENDENT_AMBULATORY_CARE_PROVIDER_SITE_OTHER): Payer: Medicare Other | Admitting: Gastroenterology

## 2020-09-04 ENCOUNTER — Other Ambulatory Visit: Payer: Self-pay

## 2020-09-04 ENCOUNTER — Encounter (INDEPENDENT_AMBULATORY_CARE_PROVIDER_SITE_OTHER): Payer: Self-pay | Admitting: *Deleted

## 2020-09-04 VITALS — BP 152/80 | HR 67 | Temp 96.8°F | Ht 65.0 in | Wt 182.4 lb

## 2020-09-04 DIAGNOSIS — K625 Hemorrhage of anus and rectum: Secondary | ICD-10-CM | POA: Diagnosis not present

## 2020-09-04 DIAGNOSIS — R198 Other specified symptoms and signs involving the digestive system and abdomen: Secondary | ICD-10-CM | POA: Diagnosis not present

## 2020-09-04 DIAGNOSIS — Z794 Long term (current) use of insulin: Secondary | ICD-10-CM | POA: Diagnosis not present

## 2020-09-04 DIAGNOSIS — R194 Change in bowel habit: Secondary | ICD-10-CM

## 2020-09-04 DIAGNOSIS — E1165 Type 2 diabetes mellitus with hyperglycemia: Secondary | ICD-10-CM | POA: Diagnosis not present

## 2020-09-04 MED ORDER — PLENVU 140 G PO SOLR: 1.0000 | Freq: Once | ORAL | 0 refills | Status: AC

## 2020-09-04 NOTE — H&P (View-Only) (Signed)
 Patient profile: Angelica Wood is a 84 y.o. female seen for evaluation of change in bowel habits. New patient to clinic.  Past medical history of diabetes, hypothyroidism, hypertension.  History of Present Illness: Angelica Wood is seen today for change in bowel habits - she reports combination of both constipation and diarrhea. Unable to state which is more frequent. She reports trying miralax in past - this causes diarrhea. She denies any improvement w/ stool softener (caused diarrhea as well).  The patient seems to alternate with diarrhea-she has identified fruits w/ peelings as triggers of diarrhea but feels these are important for her nutrition.  States sometimes she does not have a bowel movement for several days but will have diarrhea several days other times. Has some mild abd discomfort that can occur with either constipation or diarrhea. Feels sometimes doesn't digest foods and stools have mucus in them.  She also has noted blood in her stool several times that was bright red, worse in May 2021.  She feels it may be related to hemorrhoids but she is not sure. She denies any rectal pain.   She denies any GERD symptoms or nausea vomiting.  Appetite good weight stable.  No dysphagia. She reports recent weight gain is due to insulin.   Wt Readings from Last 3 Encounters:  09/04/20 182 lb 6.4 oz (82.7 kg)  07/31/20 178 lb (80.7 kg)  08/01/19 173 lb 15.1 oz (78.9 kg)     Last Colonoscopy: Per patient last around 2007 and had polyps on both colonoscopies in the past Last Endoscopy: None prior   Past Medical History:  Past Medical History:  Diagnosis Date  . Anemia    hx of  . Arthritis   . Bronchitis    hx of  . Chronic fatigue   . Diabetes mellitus   . Fibromyalgia   . GERD (gastroesophageal reflux disease)   . Hypertension    [name]  . Hypothyroidism   . Irritable bowel syndrome (IBS)    hx of  . Migraine    hx of  . Pneumonia    hx of  . PONV (postoperative nausea  and vomiting)   . Shortness of breath    with exertion  . Sleep apnea    Pt denies  . Thyroid disease   . Urgency of urination   . Urinary incontinence   . Vertigo    hx of    Problem List: Patient Active Problem List   Diagnosis Date Noted  . Type 2 diabetes mellitus with other specified complication (HCC) 08/04/2019  . HTN (hypertension) 08/04/2019  . Hypothyroidism 08/04/2019  . OA (osteoarthritis) of hip 08/01/2019  . H/O total shoulder replacement 02/27/2013  . Pain in joint, shoulder region 02/27/2013  . Muscle weakness (generalized) 02/27/2013  . Shoulder arthritis 06/30/2012    Past Surgical History: Past Surgical History:  Procedure Laterality Date  . ABDOMINAL HYSTERECTOMY    . APPENDECTOMY    . CHOLECYSTECTOMY    . COLONOSCOPY W/ POLYPECTOMY    . DILATION AND CURETTAGE OF UTERUS    . EYE SURGERY     right cataract removed, laser surgery for glaucoma  . JOINT REPLACEMENT Right    thumb  . TONSILLECTOMY    . TOTAL HIP ARTHROPLASTY Right 08/01/2019   Procedure: TOTAL HIP ARTHROPLASTY ANTERIOR APPROACH;  Surgeon: Aluisio, Frank, MD;  Location: [facility];  Service: Orthopedics;  Laterality: Right;  100min  . TOTAL SHOULDER ARTHROPLASTY  06/29/2012   Procedure:   TOTAL SHOULDER ARTHROPLASTY;  Surgeon: Kevin M Supple, MD;  Location: [facility];  Service: Orthopedics;  Laterality: Left;  left total shoulder arthroplasty  . TOTAL SHOULDER ARTHROPLASTY Right 02/08/2013   Procedure: TOTAL SHOULDER ARTHROPLASTY;  Surgeon: Kevin M Supple, MD;  Location: [facility];  Service: Orthopedics;  Laterality: Right;    Allergies: Allergies  Allergen Reactions  . Gabapentin Nausea And Vomiting and Other (See Comments)    Unable to urinate  . Adenosine Other (See Comments)    Unknown rxn   . Glucophage [Metformin Hydrochloride] Diarrhea  . Meloxicam Nausea Only    Dizziness  . Prednisone Nausea And Vomiting  . Ace Inhibitors Other (See Comments)    Cough  . Clindamycin Hcl Rash    All over  torso   . Macrodantin [Nitrofurantoin Macrocrystal] Rash    Welts  . Sulfa Antibiotics Rash    Welts  . Tramadol Rash      Home Medications:  Current Outpatient Medications:  .  Biotin 10000 MCG TABS, Take 10,000 mcg by mouth daily., Disp: , Rfl:  .  cetirizine (ZYRTEC) 10 MG tablet, Take 10 mg by mouth daily., Disp: , Rfl:  .  CINNAMON PO, Take 2,000 mg by mouth daily., Disp: , Rfl:  .  DULoxetine (CYMBALTA) 60 MG capsule, Take 60 mg by mouth 2 (two) times daily., Disp: , Rfl:  .  insulin detemir (LEVEMIR) 100 UNIT/ML injection, Inject 0.1 mLs (10 Units total) into the skin at bedtime. Start on Sunday 08/05/19 at bedtime, Disp: 10 mL, Rfl: 11 .  insulin lispro (HUMALOG KWIKPEN) 100 UNIT/ML KwikPen, Inject 0.03 mLs (3 Units total) into the skin 3 (three) times daily with meals., Disp: 15 mL, Rfl: 11 .  levothyroxine (SYNTHROID, LEVOTHROID) 100 MCG tablet, Take 100 mcg by mouth daily before breakfast. , Disp: , Rfl:  .  losartan-hydrochlorothiazide (HYZAAR) 100-12.5 MG per tablet, Take 1 tablet by mouth daily., Disp: , Rfl:  .  metoprolol (LOPRESSOR) 50 MG tablet, Take 50 mg by mouth daily. , Disp: , Rfl:  .  montelukast (SINGULAIR) 10 MG tablet, Take 10 mg by mouth daily. , Disp: , Rfl:  .  OVER THE COUNTER MEDICATION, daily. Immune + daily wellness, Disp: , Rfl:  .  albuterol (PROVENTIL HFA;VENTOLIN HFA) 108 (90 BASE) MCG/ACT inhaler, Inhale 2 puffs into the lungs every 6 (six) hours as needed for wheezing. (Patient not taking: Reported on 09/04/2020), Disp: , Rfl:  .  methocarbamol (ROBAXIN) 500 MG tablet, Take 1 tablet (500 mg total) by mouth every 6 (six) hours as needed for muscle spasms., Disp: 40 tablet, Rfl: 1 .  PEG-KCl-NaCl-NaSulf-Na Asc-C (PLENVU) 140 g SOLR, Take 1 kit by mouth once for 1 dose., Disp: 1 each, Rfl: 0   Family History: Denies family history of colon polyps or colon cancer.  Social History:   reports that she quit smoking about 51 years ago. Her smoking use  included cigarettes. She has a 1.00 pack-year smoking history. She has never used smokeless tobacco. She reports that she does not drink alcohol and does not use drugs.   Review of Systems: Constitutional: Denies weight loss/weight gain  Eyes: No changes in vision. ENT: No oral lesions, sore throat.  GI: see HPI.  Heme/Lymph: No easy bruising.  CV: No chest pain.  GU: No hematuria.  Integumentary: No rashes.  Neuro: No headaches.  Psych: No depression/anxiety.  Endocrine: No heat/cold intolerance.  Allergic/Immunologic: No urticaria.  Resp: No cough, SOB.  Musculoskeletal: No joint   swelling.    Physical Examination: BP (!) 152/80 (BP Location: Right Arm, Patient Position: Sitting, Cuff Size: Normal)   Pulse 67   Temp (!) 96.8 F (36 C) (Temporal)   Ht 5' 5" (1.651 m)   Wt 182 lb 6.4 oz (82.7 kg)   BMI 30.35 kg/m  Gen: NAD, alert and oriented x 4 HEENT: PEERLA, EOMI, Neck: supple, no JVD Chest: CTA bilaterally, no wheezes, crackles, or other adventitious sounds CV: RRR, no m/g/c/r Abd: soft, NT, ND, +BS in all four quadrants; no HSM, guarding, ridigity, or rebound tenderness Ext: no edema, well perfused with 2+ pulses, Skin: no rash or lesions noted on observed skin Lymph: no noted LAD  Data Reviewed:  August 2021-cbc normal, BMP w/ glucose 262.   July 2021-Na 135, glucose 326, GFR 48. A1c 9.7   Assessment/Plan: Angelica Wood is a 84 y.o. female   1.  Change in bowel habits/rectal bleeding-seems to alternate between constipation/diarrhea. Associated with small-volume rectal bleeding. Suspect symptoms may be due to irritable bowel with anal outlet bleeding but given her last colonoscopy was 14 years ago and she has a history of polyps will repeat colonoscopy to exclude underlying pathology.  We discussed some diet modifications.  She has never tried fiber or probiotic and she will try these.  She has tried MiraLAX and stool softener for constipation which both caused  diarrhea   Patient denies CP, SOB, and use of blood thinners. I discussed the risks and benefits of procedure including bleeding, perforation, infection, missed lesions, medication reactions and possible hospitalization or surgery if complications. All questions answered.  She was on Xarelto for 1 month after hip surgery last year but is no longer taking.  She denies any NSAID use.  Angelica Wood was seen today for rectal bleeding.  Diagnoses and all orders for this visit:  Change in bowel habits  Rectal bleeding   I personally performed the service, non-incident to. (WP)  Taylen Wendland, PA-C [facility] 

## 2020-09-04 NOTE — Patient Instructions (Addendum)
Try fiber supplement such as citrucel or Benefiber. Try [facility] [employer] or Align for probiotics

## 2020-09-04 NOTE — Telephone Encounter (Signed)
Patient needs Plenvu (copay card) ° °

## 2020-09-04 NOTE — Progress Notes (Signed)
Patient profile: Angelica Wood is a 84 y.o. female seen for evaluation of change in bowel habits. New patient to clinic.  Past medical history of diabetes, hypothyroidism, hypertension.  History of Present Illness: Angelica Wood is seen today for change in bowel habits - she reports combination of both constipation and diarrhea. Unable to state which is more frequent. She reports trying miralax in past - this causes diarrhea. She denies any improvement w/ stool softener (caused diarrhea as well).  The patient seems to alternate with diarrhea-she has identified fruits w/ peelings as triggers of diarrhea but feels these are important for her nutrition.  States sometimes she does not have a bowel movement for several days but will have diarrhea several days other times. Has some mild abd discomfort that can occur with either constipation or diarrhea. Feels sometimes doesn't digest foods and stools have mucus in them.  She also has noted blood in her stool several times that was bright red, worse in May 2021.  She feels it may be related to hemorrhoids but she is not sure. She denies any rectal pain.   She denies any GERD symptoms or nausea vomiting.  Appetite good weight stable.  No dysphagia. She reports recent weight gain is due to insulin.   Wt Readings from Last 3 Encounters:  09/04/20 182 lb 6.4 oz (82.7 kg)  07/31/20 178 lb (80.7 kg)  08/01/19 173 lb 15.1 oz (78.9 kg)     Last Colonoscopy: Per patient last around 2007 and had polyps on both colonoscopies in the past Last Endoscopy: None prior   Past Medical History:  Past Medical History:  Diagnosis Date  . Anemia    hx of  . Arthritis   . Bronchitis    hx of  . Chronic fatigue   . Diabetes mellitus   . Fibromyalgia   . GERD (gastroesophageal reflux disease)   . Hypertension    [name]  . Hypothyroidism   . Irritable bowel syndrome (IBS)    hx of  . Migraine    hx of  . Pneumonia    hx of  . PONV (postoperative nausea  and vomiting)   . Shortness of breath    with exertion  . Sleep apnea    Pt denies  . Thyroid disease   . Urgency of urination   . Urinary incontinence   . Vertigo    hx of    Problem List: Patient Active Problem List   Diagnosis Date Noted  . Type 2 diabetes mellitus with other specified complication ([place]) 44/96/7591  . HTN (hypertension) 08/04/2019  . Hypothyroidism 08/04/2019  . OA (osteoarthritis) of hip 08/01/2019  . H/O total shoulder replacement 02/27/2013  . Pain in joint, shoulder region 02/27/2013  . Muscle weakness (generalized) 02/27/2013  . Shoulder arthritis 06/30/2012    Past Surgical History: Past Surgical History:  Procedure Laterality Date  . ABDOMINAL HYSTERECTOMY    . APPENDECTOMY    . CHOLECYSTECTOMY    . COLONOSCOPY W/ POLYPECTOMY    . DILATION AND CURETTAGE OF UTERUS    . EYE SURGERY     right cataract removed, laser surgery for glaucoma  . JOINT REPLACEMENT Right    thumb  . TONSILLECTOMY    . TOTAL HIP ARTHROPLASTY Right 08/01/2019   Procedure: TOTAL HIP ARTHROPLASTY ANTERIOR APPROACH;  Surgeon: Gaynelle Arabian, MD;  Location: [facility];  Service: Orthopedics;  Laterality: Right;  142mn  . TOTAL SHOULDER ARTHROPLASTY  06/29/2012   Procedure:  TOTAL SHOULDER ARTHROPLASTY;  Surgeon: Marin Shutter, MD;  Location: [place];  Service: Orthopedics;  Laterality: Left;  left total shoulder arthroplasty  . TOTAL SHOULDER ARTHROPLASTY Right 02/08/2013   Procedure: TOTAL SHOULDER ARTHROPLASTY;  Surgeon: Marin Shutter, MD;  Location: [facility];  Service: Orthopedics;  Laterality: Right;    Allergies: Allergies  Allergen Reactions  . Gabapentin Nausea And Vomiting and Other (See Comments)    Unable to urinate  . Adenosine Other (See Comments)    Unknown rxn   . Glucophage [Metformin Hydrochloride] Diarrhea  . Meloxicam Nausea Only    Dizziness  . Prednisone Nausea And Vomiting  . Ace Inhibitors Other (See Comments)    Cough  . Clindamycin Hcl Rash    All over  torso   . Macrodantin [Nitrofurantoin Macrocrystal] Rash    Welts  . Sulfa Antibiotics Rash    Welts  . Tramadol Rash      Home Medications:  Current Outpatient Medications:  .  Biotin 10000 MCG TABS, Take 10,000 mcg by mouth daily., Disp: , Rfl:  .  cetirizine (ZYRTEC) 10 MG tablet, Take 10 mg by mouth daily., Disp: , Rfl:  .  CINNAMON PO, Take 2,000 mg by mouth daily., Disp: , Rfl:  .  DULoxetine (CYMBALTA) 60 MG capsule, Take 60 mg by mouth 2 (two) times daily., Disp: , Rfl:  .  insulin detemir (LEVEMIR) 100 UNIT/ML injection, Inject 0.1 mLs (10 Units total) into the skin at bedtime. Start on Sunday 08/05/19 at bedtime, Disp: 10 mL, Rfl: 11 .  insulin lispro (HUMALOG KWIKPEN) 100 UNIT/ML KwikPen, Inject 0.03 mLs (3 Units total) into the skin 3 (three) times daily with meals., Disp: 15 mL, Rfl: 11 .  levothyroxine (SYNTHROID, LEVOTHROID) 100 MCG tablet, Take 100 mcg by mouth daily before breakfast. , Disp: , Rfl:  .  losartan-hydrochlorothiazide (HYZAAR) 100-12.5 MG per tablet, Take 1 tablet by mouth daily., Disp: , Rfl:  .  metoprolol (LOPRESSOR) 50 MG tablet, Take 50 mg by mouth daily. , Disp: , Rfl:  .  montelukast (SINGULAIR) 10 MG tablet, Take 10 mg by mouth daily. , Disp: , Rfl:  .  OVER THE COUNTER MEDICATION, daily. Immune + daily wellness, Disp: , Rfl:  .  albuterol (PROVENTIL HFA;VENTOLIN HFA) 108 (90 BASE) MCG/ACT inhaler, Inhale 2 puffs into the lungs every 6 (six) hours as needed for wheezing. (Patient not taking: Reported on 09/04/2020), Disp: , Rfl:  .  methocarbamol (ROBAXIN) 500 MG tablet, Take 1 tablet (500 mg total) by mouth every 6 (six) hours as needed for muscle spasms., Disp: 40 tablet, Rfl: 1 .  PEG-KCl-NaCl-NaSulf-Na Asc-C (PLENVU) 140 g SOLR, Take 1 kit by mouth once for 1 dose., Disp: 1 each, Rfl: 0   Family History: Denies family history of colon polyps or colon cancer.  Social History:   reports that she quit smoking about 51 years ago. Her smoking use  included cigarettes. She has a 1.00 pack-year smoking history. She has never used smokeless tobacco. She reports that she does not drink alcohol and does not use drugs.   Review of Systems: Constitutional: Denies weight loss/weight gain  Eyes: No changes in vision. ENT: No oral lesions, sore throat.  GI: see HPI.  Heme/Lymph: No easy bruising.  CV: No chest pain.  GU: No hematuria.  Integumentary: No rashes.  Neuro: No headaches.  Psych: No depression/anxiety.  Endocrine: No heat/cold intolerance.  Allergic/Immunologic: No urticaria.  Resp: No cough, SOB.  Musculoskeletal: No joint  swelling.    Physical Examination: BP (!) 152/80 (BP Location: Right Arm, Patient Position: Sitting, Cuff Size: Normal)   Pulse 67   Temp (!) 96.8 F (36 C) (Temporal)   Ht _0  (1.651 m)   Wt 182 lb 6.4 oz (82.7 kg)   BMI 30.35 kg/m  Gen: NAD, alert and oriented x 4 HEENT: PEERLA, EOMI, Neck: supple, no JVD Chest: CTA bilaterally, no wheezes, crackles, or other adventitious sounds CV: RRR, no m/g/c/r Abd: soft, NT, ND, +BS in all four quadrants; no HSM, guarding, ridigity, or rebound tenderness Ext: no edema, well perfused with 2+ pulses, Skin: no rash or lesions noted on observed skin Lymph: no noted LAD  Data Reviewed:  August 2021-cbc normal, BMP w/ glucose 262.   July 2021-Na 135, glucose 326, GFR 48. A1c 9.7   Assessment/Plan: Ms. Hanken is a 84 y.o. female   1.  Change in bowel habits/rectal bleeding-seems to alternate between constipation/diarrhea. Associated with small-volume rectal bleeding. Suspect symptoms may be due to irritable bowel with anal outlet bleeding but given her last colonoscopy was 14 years ago and she has a history of polyps will repeat colonoscopy to exclude underlying pathology.  We discussed some diet modifications.  She has never tried fiber or probiotic and she will try these.  She has tried MiraLAX and stool softener for constipation which both caused  diarrhea   Patient denies CP, SOB, and use of blood thinners. I discussed the risks and benefits of procedure including bleeding, perforation, infection, missed lesions, medication reactions and possible hospitalization or surgery if complications. All questions answered.  She was on Xarelto for 1 month after hip surgery last year but is no longer taking.  She denies any NSAID use.  Cynitha was seen today for rectal bleeding.  Diagnoses and all orders for this visit:  Change in bowel habits  Rectal bleeding   I personally performed the service, non-incident to. (WP)  Laurine Blazer, [facility]

## 2020-09-05 DIAGNOSIS — R198 Other specified symptoms and signs involving the digestive system and abdomen: Secondary | ICD-10-CM | POA: Insufficient documentation

## 2020-09-25 ENCOUNTER — Other Ambulatory Visit (HOSPITAL_COMMUNITY)
Admission: RE | Admit: 2020-09-25 | Discharge: 2020-09-25 | Disposition: A | Discharge status: Home / Self Care | Payer: Medicare (Managed Care) | Source: Ambulatory Visit | Attending: Gastroenterology | Admitting: Gastroenterology

## 2020-09-25 ENCOUNTER — Encounter (HOSPITAL_COMMUNITY): Payer: Self-pay

## 2020-09-25 ENCOUNTER — Other Ambulatory Visit: Payer: Self-pay

## 2020-09-25 ENCOUNTER — Encounter (HOSPITAL_COMMUNITY)
Admission: RE | Admit: 2020-09-25 | Discharge: 2020-09-25 | Disposition: A | Discharge status: Home / Self Care | Payer: Medicare (Managed Care) | Source: Ambulatory Visit | Attending: Gastroenterology | Admitting: Gastroenterology

## 2020-09-25 DIAGNOSIS — Z01812 Encounter for preprocedural laboratory examination: Secondary | ICD-10-CM | POA: Insufficient documentation

## 2020-09-25 DIAGNOSIS — Z20822 Contact with and (suspected) exposure to covid-19: Secondary | ICD-10-CM | POA: Diagnosis not present

## 2020-09-25 HISTORY — DX: Anxiety disorder, unspecified: F41.9

## 2020-09-25 LAB — SARS CORONAVIRUS 2 (TAT 6-24 HRS): SARS Coronavirus 2: NEGATIVE

## 2020-09-25 NOTE — Patient Instructions (Signed)
Angelica Wood  09/25/2020     @PREFPERIOPPHARMACY @   Your procedure is scheduled on 09/26/20.  Report to [facility] at 12:00 P.M.  Call this number if you have problems the morning of surgery:  [phone]   Remember:  Do not eat or drink after midnight.    Take these medicines the morning of surgery with A SIP OF WATER zyrtec, cymbalta, levothyroxine, robaxin, metoprolol. Take only 5 units of Levemir @ bedtime.    Do not wear jewelry, make-up or nail polish.  Do not wear lotions, powders, or perfumes, or deodorant.  Do not shave 48 hours prior to surgery.  Men may shave face and neck.  Do not bring valuables to the hospital.  [facility] is not responsible for any belongings or valuables.  Contacts, dentures or bridgework may not be worn into surgery.  Leave your suitcase in the car.  After surgery it may be brought to your room.  For patients admitted to the hospital, discharge time will be determined by your treatment team.  Patients discharged the day of surgery will not be allowed to drive home.   Name and phone number of your driver:   family Special instructions:  Follow colon prep instructions given to you from the office.  Please read over the following fact sheets that you were given. Anesthesia Post-op Instructions and Care and Recovery After Surgery      PATIENT INSTRUCTIONS POST-ANESTHESIA  IMMEDIATELY FOLLOWING SURGERY:  Do not drive or operate machinery for the first twenty four hours after surgery.  Do not make any important decisions for twenty four hours after surgery or while taking narcotic pain medications or sedatives.  If you develop intractable nausea and vomiting or a severe headache please notify your doctor immediately.  FOLLOW-UP:  Please make an appointment with your surgeon as instructed. You do not need to follow up with anesthesia unless specifically instructed to do so.  WOUND CARE INSTRUCTIONS (if applicable):  Keep a dry clean  dressing on the anesthesia/puncture wound site if there is drainage.  Once the wound has quit draining you may leave it open to air.  Generally you should leave the bandage intact for twenty four hours unless there is drainage.  If the epidural site drains for more than 36-48 hours please call the anesthesia department.  QUESTIONS?:  Please feel free to call your physician or the hospital operator if you have any questions, and they will be happy to assist you.       Monitored Anesthesia Care Anesthesia is a term that refers to techniques, procedures, and medicines that help a person stay safe and comfortable during a medical procedure. Monitored anesthesia care, or sedation, is one type of anesthesia. Your anesthesia specialist may recommend sedation if you will be having a procedure that does not require you to be unconscious, such as:  Cataract surgery.  A dental procedure.  A biopsy.  A colonoscopy. During the procedure, you may receive a medicine to help you relax (sedative). There are three levels of sedation:  Mild sedation. At this level, you may feel awake and relaxed. You will be able to follow directions.  Moderate sedation. At this level, you will be sleepy. You may not remember the procedure.  Deep sedation. At this level, you will be asleep. You will not remember the procedure. The more medicine you are given, the deeper your level of sedation will be. Depending on how you respond to the procedure, the anesthesia  specialist may change your level of sedation or the type of anesthesia to fit your needs. An anesthesia specialist will monitor you closely during the procedure. Let your health care provider know about:  Any allergies you have.  All medicines you are taking, including vitamins, herbs, eye drops, creams, and over-the-counter medicines.  Any use of steroids (by mouth or as a cream).  Any problems you or family members have had with sedatives and anesthetic  medicines.  Any blood disorders you have.  Any surgeries you have had.  Any medical conditions you have, such as sleep apnea.  Whether you are pregnant or may be pregnant.  Any use of cigarettes, alcohol, or street drugs. What are the risks? Generally, this is a safe procedure. However, problems may occur, including:  Getting too much medicine (oversedation).  Nausea.  Allergic reaction to medicines.  Trouble breathing. If this happens, a breathing tube may be used to help with breathing. It will be removed when you are awake and breathing on your own.  Heart trouble.  Lung trouble. Before the procedure Staying hydrated Follow instructions from your health care provider about hydration, which may include:  Up to 2 hours before the procedure - you may continue to drink clear liquids, such as water, clear fruit juice, black coffee, and plain tea. Eating and drinking restrictions Follow instructions from your health care provider about eating and drinking, which may include:  8 hours before the procedure - stop eating heavy meals or foods such as meat, fried foods, or fatty foods.  6 hours before the procedure - stop eating light meals or foods, such as toast or cereal.  6 hours before the procedure - stop drinking milk or drinks that contain milk.  2 hours before the procedure - stop drinking clear liquids. Medicines Ask your health care provider about:  Changing or stopping your regular medicines. This is especially important if you are taking diabetes medicines or blood thinners.  Taking medicines such as aspirin and ibuprofen. These medicines can thin your blood. Do not take these medicines before your procedure if your health care provider instructs you not to. Tests and exams  You will have a physical exam.  You may have blood tests done to show: ? How well your kidneys and liver are working. ? How well your blood can clot. General instructions  Plan to have  someone take you home from the hospital or clinic.  If you will be going home right after the procedure, plan to have someone with you for 24 hours.  What happens during the procedure?  Your blood pressure, heart rate, breathing, level of pain and overall condition will be monitored.  An IV tube will be inserted into one of your veins.  Your anesthesia specialist will give you medicines as needed to keep you comfortable during the procedure. This may mean changing the level of sedation.  The procedure will be performed. After the procedure  Your blood pressure, heart rate, breathing rate, and blood oxygen level will be monitored until the medicines you were given have worn off.  Do not drive for 24 hours if you received a sedative.  You may: ? Feel sleepy, clumsy, or nauseous. ? Feel forgetful about what happened after the procedure. ? Have a sore throat if you had a breathing tube during the procedure. ? Vomit. This information is not intended to replace advice given to you by your health care provider. Make sure you discuss any questions you have  with your health care provider. Document Revised: 11/11/2017 Document Reviewed: 03/21/2016 Elsevier Patient Education  [place].  Colonoscopy, Adult A colonoscopy is a procedure to look at the entire large intestine. This procedure is done using a long, thin, flexible tube that has a camera on the end. You may have a colonoscopy:  As a part of normal colorectal screening.  If you have certain symptoms, such as: ? A low number of red blood cells in your blood (anemia). ? Diarrhea that does not go away. ? Pain in your abdomen. ? Blood in your stool. A colonoscopy can help screen for and diagnose medical problems, including:  Tumors.  Extra tissue that grows where mucus forms (polyps).  Inflammation.  Areas of bleeding. Tell your health care provider about:  Any allergies you have.  All medicines you are taking,  including vitamins, herbs, eye drops, creams, and over-the-counter medicines.  Any problems you or family members have had with anesthetic medicines.  Any blood disorders you have.  Any surgeries you have had.  Any medical conditions you have.  Any problems you have had with having bowel movements.  Whether you are pregnant or may be pregnant. What are the risks? Generally, this is a safe procedure. However, problems may occur, including:  Bleeding.  Damage to your intestine.  Allergic reactions to medicines given during the procedure.  Infection. This is rare. What happens before the procedure? Eating and drinking restrictions Follow instructions from your health care provider about eating or drinking restrictions, which may include:  A few days before the procedure: ? Follow a low-fiber diet. ? Avoid nuts, seeds, dried fruit, raw fruits, and vegetables.  1-3 days before the procedure: ? Eat only gelatin dessert or ice pops. ? Drink only clear liquids, such as water, clear juice, clear broth or bouillon, black coffee or tea, or clear soft drinks or sports drinks. ? Avoid liquids that contain red or purple dye.  The day of the procedure: ? Do not eat solid foods. You may continue to drink clear liquids until up to 2 hours before the procedure. ? Do not eat or drink anything starting 2 hours before the procedure, or within the time period that your health care provider recommends. Bowel prep If you were prescribed a bowel prep to take by mouth (orally) to clean out your colon:  Take it as told by your health care provider. Starting the day before your procedure, you will need to drink a large amount of liquid medicine. The liquid will cause you to have many bowel movements of loose stool until your stool becomes almost clear or light green.  If your skin or the opening between the buttocks (anus) gets irritated from diarrhea, you may relieve the irritation using: ? Wipes  with medicine in them, such as adult wet wipes with aloe and vitamin E. ? A product to soothe skin, such as petroleum jelly.  If you vomit while drinking the bowel prep: ? Take a break for up to 60 minutes. ? Begin the bowel prep again. ? Call your health care provider if you keep vomiting or you cannot take the bowel prep without vomiting.  To clean out your colon, you may also be given: ? Laxative medicines. These help you have a bowel movement. ? Instructions for enema use. An enema is liquid medicine injected into your rectum. Medicines Ask your health care provider about:  Changing or stopping your regular medicines or supplements. This is especially important if  you are taking iron supplements, diabetes medicines, or blood thinners.  Taking medicines such as aspirin and ibuprofen. These medicines can thin your blood. Do not take these medicines unless your health care provider tells you to take them.  Taking over-the-counter medicines, vitamins, herbs, and supplements. General instructions  Ask your health care provider what steps will be taken to help prevent infection. These may include washing skin with a germ-killing soap.  Plan to have someone take you home from the hospital or clinic. What happens during the procedure?   An IV will be inserted into one of your veins.  You may be given one or more of the following: ? A medicine to help you relax (sedative). ? A medicine to numb the area (local anesthetic). ? A medicine to make you fall asleep (general anesthetic). This is rarely needed.  You will lie on your side with your knees bent.  The tube will: ? Have oil or gel put on it (be lubricated). ? Be inserted into your anus. ? Be gently eased through all parts of your large intestine.  Air will be sent into your colon to keep it open. This may cause some pressure or cramping.  Images will be taken with the camera and will appear on a screen.  A small tissue  sample may be removed to be looked at under a microscope (biopsy). The tissue may be sent to a lab for testing if any signs of problems are found.  If small polyps are found, they may be removed and checked for cancer cells.  When the procedure is finished, the tube will be removed. The procedure may vary among health care providers and hospitals. What happens after the procedure?  Your blood pressure, heart rate, breathing rate, and blood oxygen level will be monitored until you leave the hospital or clinic.  You may have a small amount of blood in your stool.  You may pass gas and have mild cramping or bloating in your abdomen. This is caused by the air that was used to open your colon during the exam.  Do not drive for 24 hours after the procedure.  It is up to you to get the results of your procedure. Ask your health care provider, or the department that is doing the procedure, when your results will be ready. Summary  A colonoscopy is a procedure to look at the entire large intestine.  Follow instructions from your health care provider about eating and drinking before the procedure.  If you were prescribed an oral bowel prep to clean out your colon, take it as told by your health care provider.  During the colonoscopy, a flexible tube with a camera on its end is inserted into the anus and then passed into the other parts of the large intestine. This information is not intended to replace advice given to you by your health care provider. Make sure you discuss any questions you have with your health care provider. Document Revised: 06/22/2019 Document Reviewed: 06/22/2019 Elsevier Patient Education  [place].

## 2020-09-26 ENCOUNTER — Ambulatory Visit (HOSPITAL_COMMUNITY)
Admission: RE | Admit: 2020-09-26 | Discharge: 2020-09-26 | Disposition: A | Discharge status: Home / Self Care | Payer: Medicare (Managed Care) | Attending: Gastroenterology | Admitting: Gastroenterology

## 2020-09-26 ENCOUNTER — Encounter (HOSPITAL_COMMUNITY)
Admission: RE | Disposition: A | Discharge status: Home / Self Care | Payer: Self-pay | Source: Home / Self Care | Attending: Gastroenterology

## 2020-09-26 ENCOUNTER — Other Ambulatory Visit: Payer: Self-pay

## 2020-09-26 ENCOUNTER — Ambulatory Visit (HOSPITAL_COMMUNITY): Payer: Medicare (Managed Care) | Admitting: Anesthesiology

## 2020-09-26 DIAGNOSIS — F419 Anxiety disorder, unspecified: Secondary | ICD-10-CM | POA: Insufficient documentation

## 2020-09-26 DIAGNOSIS — D125 Benign neoplasm of sigmoid colon: Secondary | ICD-10-CM | POA: Diagnosis not present

## 2020-09-26 DIAGNOSIS — K219 Gastro-esophageal reflux disease without esophagitis: Secondary | ICD-10-CM | POA: Insufficient documentation

## 2020-09-26 DIAGNOSIS — G4730 Sleep apnea, unspecified: Secondary | ICD-10-CM | POA: Diagnosis not present

## 2020-09-26 DIAGNOSIS — E119 Type 2 diabetes mellitus without complications: Secondary | ICD-10-CM | POA: Diagnosis not present

## 2020-09-26 DIAGNOSIS — Z96691 Finger-joint replacement of right hand: Secondary | ICD-10-CM | POA: Diagnosis not present

## 2020-09-26 DIAGNOSIS — E039 Hypothyroidism, unspecified: Secondary | ICD-10-CM | POA: Diagnosis not present

## 2020-09-26 DIAGNOSIS — Z96611 Presence of right artificial shoulder joint: Secondary | ICD-10-CM | POA: Diagnosis not present

## 2020-09-26 DIAGNOSIS — Z96641 Presence of right artificial hip joint: Secondary | ICD-10-CM | POA: Diagnosis not present

## 2020-09-26 DIAGNOSIS — M1610 Unilateral primary osteoarthritis, unspecified hip: Secondary | ICD-10-CM | POA: Insufficient documentation

## 2020-09-26 DIAGNOSIS — Z87891 Personal history of nicotine dependence: Secondary | ICD-10-CM | POA: Insufficient documentation

## 2020-09-26 DIAGNOSIS — Z794 Long term (current) use of insulin: Secondary | ICD-10-CM | POA: Diagnosis not present

## 2020-09-26 DIAGNOSIS — K625 Hemorrhage of anus and rectum: Secondary | ICD-10-CM | POA: Diagnosis not present

## 2020-09-26 DIAGNOSIS — I10 Essential (primary) hypertension: Secondary | ICD-10-CM | POA: Insufficient documentation

## 2020-09-26 DIAGNOSIS — K648 Other hemorrhoids: Secondary | ICD-10-CM

## 2020-09-26 DIAGNOSIS — R197 Diarrhea, unspecified: Secondary | ICD-10-CM | POA: Insufficient documentation

## 2020-09-26 DIAGNOSIS — Z96612 Presence of left artificial shoulder joint: Secondary | ICD-10-CM | POA: Diagnosis not present

## 2020-09-26 DIAGNOSIS — Z79890 Hormone replacement therapy (postmenopausal): Secondary | ICD-10-CM | POA: Insufficient documentation

## 2020-09-26 DIAGNOSIS — M797 Fibromyalgia: Secondary | ICD-10-CM | POA: Insufficient documentation

## 2020-09-26 DIAGNOSIS — D122 Benign neoplasm of ascending colon: Secondary | ICD-10-CM | POA: Insufficient documentation

## 2020-09-26 DIAGNOSIS — Z79899 Other long term (current) drug therapy: Secondary | ICD-10-CM | POA: Diagnosis not present

## 2020-09-26 HISTORY — PX: POLYPECTOMY: SHX5525

## 2020-09-26 HISTORY — PX: COLONOSCOPY WITH PROPOFOL: SHX5780

## 2020-09-26 HISTORY — PX: BIOPSY: SHX5522

## 2020-09-26 LAB — GLUCOSE, CAPILLARY
Glucose-Capillary: 116 mg/dL — ABNORMAL HIGH (ref 70–99)
Glucose-Capillary: 92 mg/dL (ref 70–99)
Glucose-Capillary: 118 mg/dL — ABNORMAL HIGH (ref 70–99)
Glucose-Capillary: 114 mg/dL — ABNORMAL HIGH (ref 70–99)

## 2020-09-26 SURGERY — COLONOSCOPY WITH PROPOFOL
Anesthesia: General

## 2020-09-26 MED ORDER — STERILE WATER FOR IRRIGATION IR SOLN
Status: DC | PRN
  Administered 2020-09-26: 200 mL

## 2020-09-26 MED ORDER — LACTATED RINGERS IV SOLN: Freq: Once | INTRAVENOUS | Status: AC

## 2020-09-26 MED ORDER — DEXTROSE 50 % IV SOLN
INTRAVENOUS | Status: AC
  Filled 2020-09-26: qty 50

## 2020-09-26 MED ORDER — PROPOFOL 500 MG/50ML IV EMUL
INTRAVENOUS | Status: DC | PRN
  Administered 2020-09-26: 100 ug/kg/min via INTRAVENOUS

## 2020-09-26 MED ORDER — LACTATED RINGERS IV SOLN: INTRAVENOUS | Status: DC | PRN

## 2020-09-26 MED ORDER — DEXTROSE 50 % IV SOLN
25.0000 mL | Freq: Once | INTRAVENOUS | Status: AC
  Administered 2020-09-26: 25 mL via INTRAVENOUS

## 2020-09-26 MED ORDER — DEXTROSE 50 % IV SOLN
12.5000 mL | Freq: Once | INTRAVENOUS | Status: AC
  Administered 2020-09-26: 12.5 mL via INTRAVENOUS

## 2020-09-26 NOTE — Anesthesia Postprocedure Evaluation (Signed)
Anesthesia Post Note  Patient: Angelica Wood  Procedure(s) Performed: COLONOSCOPY WITH PROPOFOL (N/A ) POLYPECTOMY BIOPSY  Patient location during evaluation: PACU Anesthesia Type: General Level of consciousness: awake, oriented, awake and alert and patient cooperative Pain management: satisfactory to patient Vital Signs Assessment: post-procedure vital signs reviewed and stable Respiratory status: spontaneous breathing, nonlabored ventilation and respiratory function stable Cardiovascular status: stable Postop Assessment: no apparent nausea or vomiting Anesthetic complications: no   No complications documented.   Last Vitals:  Vitals:   09/26/20 1242  BP: (!) 135/53  Pulse: (!) 55  Resp: 13  Temp: 36.6 C  SpO2: 100%    Last Pain:  Vitals:   09/26/20 1452  PainSc: 0-No pain                 Carollynn Pennywell

## 2020-09-26 NOTE — Interval H&P Note (Signed)
History and Physical Interval Note:  09/26/2020 30:58 PM  84 year old female with past medical history of diabetes, fibromyalgia, GERD, hypertension, hypothyroidism, IBS, OSA and hypothyroidism, who comes to the hospital for evaluation of changing her bowel movement frequency.  The patient has presented intermittent episodes of constipation and diarrhea.  Reports that most of the time she has prolonged constipation for several days.  States that whenever she is ready to have a bowel movement she will have abdominal pain that improves after she defecates but she reports having explosive diarrhea for up to 3 days.  Noted some blood in her stool in the past.  Her last colonoscopy was performed around 2007, she reports she had polyps at that time.  BP (!) 135/53   Pulse (!) 55   Temp 97.9 F (36.6 C)   Resp 13   Ht 5\' 5"  (1.651 m)   SpO2 100%   BMI 29.95 kg/m  GENERAL: The patient is AO x3, in no acute distress. HEENT: Head is normocephalic and atraumatic. EOMI are intact. Mouth is well hydrated and without lesions. NECK: Supple. No masses LUNGS: Clear to auscultation. No presence of rhonchi/wheezing/rales. Adequate chest expansion HEART: RRR, normal s1 and s2. ABDOMEN: Soft, nontender, no guarding, no peritoneal signs, and nondistended. BS +. No masses. EXTREMITIES: Without any cyanosis, clubbing, rash, lesions or edema. NEUROLOGIC: AOx3, no focal motor deficit. SKIN: no jaundice, no rashes   [name]  has presented today for surgery, with the diagnosis of rectal bleeding.  The various methods of treatment have been discussed with the patient and family. After consideration of risks, benefits and other options for treatment, the patient has consented to  Procedure(s) with comments: COLONOSCOPY WITH PROPOFOL (N/A) - 215 as a surgical intervention.  The patient's history has been reviewed, patient examined, no change in status, stable for surgery.  I have reviewed the patient's chart  and labs.  Questions were answered to the patient's satisfaction.     Angelica Wood

## 2020-09-26 NOTE — Anesthesia Preprocedure Evaluation (Signed)
Anesthesia Evaluation  Patient identified by MRN, date of birth, ID band Patient awake    Reviewed: Allergy & Precautions, NPO status , Patient's Chart, lab work & pertinent test results, reviewed documented beta blocker date and time   History of Anesthesia Complications (+) PONV and history of anesthetic complications  Airway Mallampati: II  TM Distance: >3 FB Neck ROM: Full    Dental  (+) Teeth Intact, Dental Advisory Given   Pulmonary shortness of breath and with exertion, sleep apnea (snoring, never been tested) , pneumonia, resolved, former smoker,    Pulmonary exam normal breath sounds clear to auscultation       Cardiovascular hypertension, Pt. on medications and Pt. on home beta blockers Normal cardiovascular exam Rhythm:Regular Rate:Normal     Neuro/Psych  Headaches, Anxiety  Neuromuscular disease    GI/Hepatic GERD  Medicated and Controlled,  Endo/Other  diabetes, Well Controlled, Type 2, Insulin DependentHypothyroidism   Renal/GU      Musculoskeletal  (+) Arthritis , Fibromyalgia -  Abdominal   Peds  Hematology  (+) anemia ,   Anesthesia Other Findings Took half dose of insulin this morning.  Reproductive/Obstetrics                             Anesthesia Physical Anesthesia Plan  ASA: III  Anesthesia Plan: General   Post-op Pain Management:    Induction: Intravenous  PONV Risk Score and Plan: TIVA  Airway Management Planned: Nasal Cannula, Natural Airway and Simple Face Mask  Additional Equipment:   Intra-op Plan:   Post-operative Plan:   Informed Consent: I have reviewed the patients History and Physical, chart, labs and discussed the procedure including the risks, benefits and alternatives for the proposed anesthesia with the patient or authorized representative who has indicated his/her understanding and acceptance.     Dental advisory given  Plan  Discussed with: CRNA and Surgeon  Anesthesia Plan Comments:         Anesthesia Quick Evaluation

## 2020-09-26 NOTE — Discharge Instructions (Signed)
You are being discharged to home.  Resume your previous diet.  We are waiting for your pathology results.  Your physician has indicated that a repeat colonoscopy is not recommended due to your current age (81 years or older) for screening purposes.  Return to your GI clinic as previously scheduled.        Colonoscopy, Adult, Care After This sheet gives you information about how to care for yourself after your procedure. Your doctor may also give you more specific instructions. If you have problems or questions, call your doctor. What can I expect after the procedure? After the procedure, it is common to have:  A small amount of blood in your poop (stool) for 24 hours.  Some gas.  Mild cramping or bloating in your belly (abdomen). Follow these instructions at home: Eating and drinking   Drink enough fluid to keep your pee (urine) pale yellow.  Follow instructions from your doctor about what you cannot eat or drink.  Return to your normal diet as told by your doctor. Avoid heavy or fried foods that are hard to digest. Activity  Rest as told by your doctor.  Do not sit for a long time without moving. Get up to take short walks every 1-2 hours. This is important. Ask for help if you feel weak or unsteady.  Return to your normal activities as told by your doctor. Ask your doctor what activities are safe for you. To help cramping and bloating:   Try walking around.  Put heat on your belly as told by your doctor. Use the heat source that your doctor recommends, such as a moist heat pack or a heating pad. ? Put a towel between your skin and the heat source. ? Leave the heat on for 20-30 minutes. ? Remove the heat if your skin turns bright red. This is very important if you are unable to feel pain, heat, or cold. You may have a greater risk of getting burned. General instructions  For the first 24 hours after the procedure: ? Do not drive or use machinery. ? Do not sign  important documents. ? Do not drink alcohol. ? Do your daily activities more slowly than normal. ? Eat foods that are soft and easy to digest.  Take over-the-counter or prescription medicines only as told by your doctor.  Keep all follow-up visits as told by your doctor. This is important. Contact a doctor if:  You have blood in your poop 2-3 days after the procedure. Get help right away if:  You have more than a small amount of blood in your poop.  You see large clumps of tissue (blood clots) in your poop.  Your belly is swollen.  You feel like you may vomit (nauseous).  You vomit.  You have a fever.  You have belly pain that gets worse, and medicine does not help your pain. Summary  After the procedure, it is common to have a small amount of blood in your poop. You may also have mild cramping and bloating in your belly.  For the first 24 hours after the procedure, do not drive or use machinery, do not sign important documents, and do not drink alcohol.  Get help right away if you have a lot of blood in your poop, feel like you may vomit, have a fever, or have more belly pain. This information is not intended to replace advice given to you by your health care provider. Make sure you discuss any questions you  have with your health care provider. Document Revised: 06/25/2019 Document Reviewed: 06/25/2019 Elsevier Patient Education  [place] After These instructions provide you with information about caring for yourself after your procedure. Your health care provider may also give you more specific instructions. Your treatment has been planned according to current medical practices, but problems sometimes occur. Call your health care provider if you have any problems or questions after your procedure. What can I expect after the procedure? After your procedure, you may:  Feel sleepy for several hours.  Feel clumsy and have  poor balance for several hours.  Feel forgetful about what happened after the procedure.  Have poor judgment for several hours.  Feel nauseous or vomit.  Have a sore throat if you had a breathing tube during the procedure. Follow these instructions at home: For at least 24 hours after the procedure:      Have a responsible adult stay with you. It is important to have someone help care for you until you are awake and alert.  Rest as needed.  Do not: ? Participate in activities in which you could fall or become injured. ? Drive. ? Use heavy machinery. ? Drink alcohol. ? Take sleeping pills or medicines that cause drowsiness. ? Make important decisions or sign legal documents. ? Take care of children on your own. Eating and drinking  Follow the diet that is recommended by your health care provider.  If you vomit, drink water, juice, or soup when you can drink without vomiting.  Make sure you have little or no nausea before eating solid foods. General instructions  Take over-the-counter and prescription medicines only as told by your health care provider.  If you have sleep apnea, surgery and certain medicines can increase your risk for breathing problems. Follow instructions from your health care provider about wearing your sleep device: ? Anytime you are sleeping, including during daytime naps. ? While taking prescription pain medicines, sleeping medicines, or medicines that make you drowsy.  If you smoke, do not smoke without supervision.  Keep all follow-up visits as told by your health care provider. This is important. Contact a health care provider if:  You keep feeling nauseous or you keep vomiting.  You feel light-headed.  You develop a rash.  You have a fever. Get help right away if:  You have trouble breathing. Summary  For several hours after your procedure, you may feel sleepy and have poor judgment.  Have a responsible adult stay with you for at  least 24 hours or until you are awake and alert. This information is not intended to replace advice given to you by your health care provider. Make sure you discuss any questions you have with your health care provider. Document Revised: 02/27/2018 Document Reviewed: 03/21/2016 Elsevier Patient Education  [place].

## 2020-09-26 NOTE — Op Note (Signed)
[facility] Patient Name: Angelica Wood Procedure Date: 09/26/2020 2:32 PM MRN: [id number] Date of Birth: 1936-03-24 Attending MD: Maylon Peppers ,  CSN: [id number] Age: 84 Admit Type: Outpatient Procedure:                Colonoscopy Indications:              Clinically significant diarrhea of unexplained                            origin Providers:                Maylon Peppers, Caprice Kluver, Crystal Page, Casimer Bilis, Technician, Aram Candela Referring MD:              Medicines:                Monitored Anesthesia Care Complications:            No immediate complications. Estimated Blood Loss:     Estimated blood loss: none. Procedure:                Pre-Anesthesia Assessment:                           - Prior to the procedure, a History and Physical                            was performed, and patient medications, allergies                            and sensitivities were reviewed. The patient's                            tolerance of previous anesthesia was reviewed.                           - The risks and benefits of the procedure and the                            sedation options and risks were discussed with the                            patient. All questions were answered and informed                            consent was obtained.                           - ASA Grade Assessment: III - A patient with severe                            systemic disease.                           After obtaining informed consent, the colonoscope  was passed under direct vision. Throughout the                            procedure, the patient's blood pressure, pulse, and                            oxygen saturations were monitored continuously. The                            [id number]-[id number] ([id number]) scope was introduced through                            the anus and advanced to the the cecum, identified                             by appendiceal orifice and ileocecal valve. The                            colonoscopy was performed without difficulty. The                            patient tolerated the procedure well. Scope                            withdrawal time was 13 minutes. The quality of the                            bowel preparation was excellent. Scope In: 2:56:20 PM Scope Out: 3:26:41 PM Scope Withdrawal Time: 0 hours 16 minutes 19 seconds  Total Procedure Duration: 0 hours 30 minutes 21 seconds  Findings:      The perianal and digital rectal examinations were normal.      A 7 mm polyp was found in the ascending colon. The polyp was flat. Area       was successfully injected with 7 mL Orise gel for a lift polypectomy.       The polyp was removed with a cold snare. Resection and retrieval were       complete.      A 3 mm polyp was found in the ascending colon. The polyp was sessile.       The polyp was removed with a cold biopsy forceps. Resection and       retrieval were complete.      A 3 mm polyp was found in the sigmoid colon. The polyp was sessile. The       polyp was removed with a cold snare. Resection and retrieval were       complete.      The rest of the examined colon appeared normal. Biopsies for histology       were taken with a cold forceps from the right colon and left colon for       evaluation of microscopic colitis.      Non-bleeding internal hemorrhoids were found during retroflexion. The       hemorrhoids were small. Impression:               - One 7 mm polyp in the ascending colon, removed  with a cold snare. Resected and retrieved. Injected.                           - One 3 mm polyp in the ascending colon, removed                            with a cold biopsy forceps. Resected and retrieved.                           - One 3 mm polyp in the sigmoid colon, removed with                            a cold snare. Resected and retrieved.                            - The rest of the examined colon is normal.                            Biopsied.                           - Non-bleeding internal hemorrhoids. Moderate Sedation:      Per Anesthesia Care Recommendation:           - Discharge patient to home (ambulatory).                           - Resume previous diet.                           - Await pathology results.                           - Repeat colonoscopy is not recommended due to                            current age (33 years or older) for screening                            purposes.                           - Return to GI clinic as previously scheduled. Procedure Code(s):        --- Professional ---                           [id number], GC, Colonoscopy, flexible; with removal of                            tumor(s), polyp(s), or other lesion(s) by snare                            technique                           45381, Colonoscopy, flexible; with directed  submucosal injection(s), any substance                           45380, 59, Colonoscopy, flexible; with biopsy,                            single or multiple Diagnosis Code(s):        --- Professional ---                           K63.5, Polyp of colon                           K64.8, Other hemorrhoids                           R19.7, Diarrhea, unspecified CPT copyright 2019 American Medical Association. All rights reserved. The codes documented in this report are preliminary and upon coder review may  be revised to meet current compliance requirements. Maylon Peppers, MD Maylon Peppers,  09/26/2020 3:35:12 PM This report has been signed electronically. Number of Addenda: 0

## 2020-09-26 NOTE — Transfer of Care (Signed)
Immediate Anesthesia Transfer of Care Note  Patient: Angelica Wood  Procedure(s) Performed: COLONOSCOPY WITH PROPOFOL (N/A ) POLYPECTOMY BIOPSY  Patient Location: PACU  Anesthesia Type:General  Level of Consciousness: awake, alert , oriented and patient cooperative  Airway & Oxygen Therapy: Patient Spontanous Breathing  Post-op Assessment: Report given to RN, Post -op Vital signs reviewed and stable and Patient moving all extremities X 4  Post vital signs: Reviewed and stable  Last Vitals:  Vitals Value Taken Time  BP    Temp    Pulse 70 09/26/20 1532  Resp 13 09/26/20 1532  SpO2 90 % 09/26/20 1532  Vitals shown include unvalidated device data.  Last Pain:  Vitals:   09/26/20 1452  PainSc: 0-No pain         Complications: No complications documented.

## 2020-09-30 LAB — SURGICAL PATHOLOGY

## 2020-10-01 ENCOUNTER — Encounter (HOSPITAL_COMMUNITY): Payer: Self-pay | Admitting: Gastroenterology

## 2020-10-16 ENCOUNTER — Other Ambulatory Visit (HOSPITAL_COMMUNITY): Payer: Self-pay | Admitting: Family Medicine

## 2020-10-16 DIAGNOSIS — Z1231 Encounter for screening mammogram for malignant neoplasm of breast: Secondary | ICD-10-CM

## 2020-10-31 ENCOUNTER — Ambulatory Visit (HOSPITAL_COMMUNITY): Payer: Medicare (Managed Care)

## 2020-11-10 ENCOUNTER — Other Ambulatory Visit: Payer: Self-pay

## 2020-11-10 ENCOUNTER — Ambulatory Visit (HOSPITAL_COMMUNITY)
Admission: RE | Admit: 2020-11-10 | Discharge: 2020-11-10 | Disposition: A | Discharge status: Home / Self Care | Payer: Medicare (Managed Care) | Source: Ambulatory Visit | Attending: Family Medicine | Admitting: Family Medicine

## 2020-11-10 DIAGNOSIS — Z1231 Encounter for screening mammogram for malignant neoplasm of breast: Secondary | ICD-10-CM | POA: Insufficient documentation

## 2021-02-25 ENCOUNTER — Other Ambulatory Visit: Payer: Self-pay

## 2021-02-25 DIAGNOSIS — I878 Other specified disorders of veins: Secondary | ICD-10-CM

## 2021-02-25 DIAGNOSIS — E785 Hyperlipidemia, unspecified: Secondary | ICD-10-CM | POA: Insufficient documentation

## 2021-02-26 ENCOUNTER — Ambulatory Visit (HOSPITAL_COMMUNITY)
Admission: RE | Admit: 2021-02-26 | Discharge: 2021-02-26 | Disposition: A | Discharge status: Home / Self Care | Payer: Medicare HMO | Source: Ambulatory Visit | Attending: Vascular Surgery | Admitting: Vascular Surgery

## 2021-02-26 ENCOUNTER — Ambulatory Visit (INDEPENDENT_AMBULATORY_CARE_PROVIDER_SITE_OTHER): Payer: Medicare HMO | Admitting: Vascular Surgery

## 2021-02-26 ENCOUNTER — Other Ambulatory Visit: Payer: Self-pay

## 2021-02-26 VITALS — BP 138/80 | HR 65 | Temp 97.7°F | Resp 20 | Ht 65.0 in | Wt 178.0 lb

## 2021-02-26 DIAGNOSIS — I83813 Varicose veins of bilateral lower extremities with pain: Secondary | ICD-10-CM

## 2021-02-26 DIAGNOSIS — I878 Other specified disorders of veins: Secondary | ICD-10-CM | POA: Insufficient documentation

## 2021-02-26 NOTE — Progress Notes (Signed)
Referring Physician: Dr Gladstone Lighter Patient name: Angelica Wood MRN: [id number] DOB: 10-May-1936 Sex: female  REASON FOR CONSULT: Dusky feet  HPI: Angelica Wood is a 85 y.o. female, with about 61-month history of color changes in her feet.  Feet will get more dusky when her feet are dangling down or also in cold rooms.  She states she had some sort of evaluation at [facility] for this many years ago.  She states this may have been present for several years but it is more prominent over the last couple of years.  She also complains of some numbness and tingling and a sensation of walking on cotton wool on the bottom of her feet.  She has had diabetes for greater than 20 years.  She has worn thigh-high compression stockings for many years.  She states these make her legs feel better and less achy.  She also recently injured her left second toe with a nail tremor.  This is healing.  She did have a short course of antibiotics.  She does not describe claudication symptoms.  She does not have rest pain.  She has had a few varicosities on the right lateral aspect of her leg but these have been present since she was in her 26s.  Other medical problems include hypertension is controlled.  Past Medical History:  Diagnosis Date  . Anemia    hx of  . Anxiety   . Arthritis   . Bronchitis    hx of  . Chronic fatigue   . Diabetes mellitus   . Fibromyalgia   . GERD (gastroesophageal reflux disease)   . Hypertension    [name]  . Hypothyroidism   . Irritable bowel syndrome (IBS)    hx of  . Migraine    hx of  . Pneumonia    hx of  . PONV (postoperative nausea and vomiting)   . Shortness of breath    with exertion  . Sleep apnea    Pt denies  . Thyroid disease   . Urgency of urination   . Urinary incontinence   . Vertigo    hx of   Past Surgical History:  Procedure Laterality Date  . ABDOMINAL HYSTERECTOMY    . APPENDECTOMY    . BIOPSY  09/26/2020   Procedure: BIOPSY;  Surgeon: Harvel Quale, MD;  Location: [facility];  Service: Gastroenterology;;  . CHOLECYSTECTOMY    . COLONOSCOPY W/ POLYPECTOMY    . COLONOSCOPY WITH PROPOFOL N/A 09/26/2020   Procedure: COLONOSCOPY WITH PROPOFOL;  Surgeon: Harvel Quale, MD;  Location: [facility];  Service: Gastroenterology;  Laterality: N/A;  215  . DILATION AND CURETTAGE OF UTERUS    . EYE SURGERY     right cataract removed, laser surgery for glaucoma  . JOINT REPLACEMENT Right    thumb  . POLYPECTOMY  09/26/2020   Procedure: POLYPECTOMY;  Surgeon: Harvel Quale, MD;  Location: [facility];  Service: Gastroenterology;;  . TONSILLECTOMY    . TOTAL HIP ARTHROPLASTY Right 08/01/2019   Procedure: TOTAL HIP ARTHROPLASTY ANTERIOR APPROACH;  Surgeon: Gaynelle Arabian, MD;  Location: [facility];  Service: Orthopedics;  Laterality: Right;  169min  . TOTAL SHOULDER ARTHROPLASTY  06/29/2012   Procedure: TOTAL SHOULDER ARTHROPLASTY;  Surgeon: Marin Shutter, MD;  Location: [facility];  Service: Orthopedics;  Laterality: Left;  left total shoulder arthroplasty  . TOTAL SHOULDER ARTHROPLASTY Right 02/08/2013   Procedure: TOTAL SHOULDER ARTHROPLASTY;  Surgeon: Lennette Bihari  Sundra Aland, MD;  Location: [place] [place];  Service: Orthopedics;  Laterality: Right;    No family history on file.  SOCIAL HISTORY: Social History   Socioeconomic History  . Marital status: Widowed    Spouse name: Not on file  . Number of children: Not on file  . Years of education: Not on file  . Highest education level: Not on file  Occupational History  . Not on file  Tobacco Use  . Smoking status: Former Smoker    Packs/day: 0.25    Years: 4.00    Pack years: 1.00    Types: Cigarettes    Quit date: 1970    Years since quitting: 52.2  . Smokeless tobacco: Never Used  Vaping Use  . Vaping Use: Never used  Substance and Sexual Activity  . Alcohol use: No  . Drug use: No  . Sexual activity: Not on file  Other Topics Concern  . Not on file   Social History Narrative  . Not on file   Social Determinants of Health   Financial Resource Strain: Not on file  Food Insecurity: Not on file  Transportation Needs: Not on file  Physical Activity: Not on file  Stress: Not on file  Social Connections: Not on file  Intimate Partner Violence: Not on file    Allergies  Allergen Reactions  . Gabapentin Nausea And Vomiting and Other (See Comments)    Unable to urinate  . Adenosine Other (See Comments)    Unknown rxn   . Glucophage [Metformin Hydrochloride] Diarrhea  . Meloxicam Nausea Only    Dizziness  . Prednisone Nausea And Vomiting  . Ace Inhibitors Other (See Comments)    Cough  . Clindamycin Hcl Rash    All over torso   . Macrodantin [Nitrofurantoin Macrocrystal] Rash    Welts  . Sulfa Antibiotics Rash    Welts  . Tramadol Rash    Current Outpatient Medications  Medication Sig Dispense Refill  . albuterol (PROVENTIL HFA;VENTOLIN HFA) 108 (90 BASE) MCG/ACT inhaler Inhale 2 puffs into the lungs every 6 (six) hours as needed for wheezing.     . Biotin 10000 MCG TABS Take 10,000 mcg by mouth daily.    . cetirizine (ZYRTEC) 10 MG tablet Take 10 mg by mouth daily.    Marland Kitchen CINNAMON PO Take 2,000 mg by mouth daily.    . DULoxetine (CYMBALTA) 60 MG capsule Take 60 mg by mouth 2 (two) times daily.    . insulin detemir (LEVEMIR) 100 UNIT/ML injection Inject 0.1 mLs (10 Units total) into the skin at bedtime. Start on Sunday 08/05/19 at bedtime 10 mL 11  . insulin lispro (HUMALOG KWIKPEN) 100 UNIT/ML KwikPen Inject 0.03 mLs (3 Units total) into the skin 3 (three) times daily with meals. 15 mL 11  . levothyroxine (SYNTHROID, LEVOTHROID) 100 MCG tablet Take 100 mcg by mouth daily before breakfast.     . losartan-hydrochlorothiazide (HYZAAR) 100-12.5 MG per tablet Take 1 tablet by mouth daily.    . methocarbamol (ROBAXIN) 500 MG tablet Take 1 tablet (500 mg total) by mouth every 6 (six) hours as needed for muscle spasms. 40 tablet 1  .  metoprolol (LOPRESSOR) 50 MG tablet Take 50 mg by mouth daily.     . montelukast (SINGULAIR) 10 MG tablet Take 10 mg by mouth daily.     Marland Kitchen OVER THE COUNTER MEDICATION daily. Immune + daily wellness     No current facility-administered medications for this visit.    ROS:  General:  No weight loss, Fever, chills  HEENT: No recent headaches, no nasal bleeding, no visual changes, no sore throat  Neurologic: No dizziness, blackouts, seizures. No recent symptoms of stroke or mini- stroke. No recent episodes of slurred speech, or temporary blindness.  Cardiac: No recent episodes of chest pain/pressure, no shortness of breath at rest.  No shortness of breath with exertion.  Denies history of atrial fibrillation or irregular heartbeat  Vascular: No history of rest pain in feet.  No history of claudication.  No history of non-healing ulcer, No history of DVT   Pulmonary: No home oxygen, no productive cough, no hemoptysis,  No asthma or wheezing  Musculoskeletal:  [ ]  Arthritis, [ ]  Low back pain,  [ ]  Joint pain  Hematologic:No history of hypercoagulable state.  No history of easy bleeding.  No history of anemia  Gastrointestinal: No hematochezia or melena,  No gastroesophageal reflux, no trouble swallowing  Urinary: [ ]  chronic Kidney disease, [ ]  on HD - [ ]  MWF or [ ]  TTHS, [ ]  Burning with urination, [ ]  Frequent urination, [ ]  Difficulty urinating;   Skin: No rashes  Psychological: No history of anxiety,  No history of depression   Physical Examination Vitals:   02/26/21 1240  BP: 138/80  Pulse: 65  Resp: 20  Temp: 97.7 F (36.5 C)  SpO2: 95%  Weight: 178 lb (80.7 kg)  Height: 5\' 5"  (1.651 m)    General:  Alert and oriented, no acute distress HEENT: Normal Neck: No JVD Cardiac: Regular Rate and Rhythm Skin: No rash, toes slightly dusky and bluish in appearance bilaterally, cluster of varicose veins on the right lateral aspect of the thigh around the knee proximally 4 to  5 mm diameter Extremity Pulses:  2+ radial, brachial, femoral, dorsalis pedis, posterior tibial pulses bilaterally Musculoskeletal: No deformity or edema  Neurologic: Upper and lower extremity motor 5/5 and symmetric  DATA:  Patient had a venous duplex for reflux today.  This showed reflux in the saphenofemoral junction and in portions of the greater saphenous vein bilaterally.  However, the greater saphenous vein is quite small less than 3 mm bilaterally.  She also has some refluxing perforators in the distal calf.  ASSESSMENT: 85 year old female with some evidence of superficial venous and perforator reflux.  Vein is not of large enough diameter to consider laser ablation.  Patient was reassured that she is not at risk of limb loss.  He has easily palpable pulses in her feet bilaterally.  The dusky skin color changes are probably a vasospastic or congested appearance but are not ischemic.   PLAN: Patient was advised to elevate her legs when possible.  She will continue to check her feet daily and have any wounds addressed promptly if she has new wounds on her feet.  She should also wear lower extremity compression stockings for relief of swelling symptoms.    She will follow up on an as-needed basis.   Ruta Hinds, MD [facility] Office: [phone]

## 2021-03-20 ENCOUNTER — Other Ambulatory Visit: Payer: Self-pay

## 2021-03-20 ENCOUNTER — Emergency Department (HOSPITAL_COMMUNITY): Payer: Medicare HMO

## 2021-03-20 ENCOUNTER — Encounter (HOSPITAL_COMMUNITY): Payer: Self-pay | Admitting: Emergency Medicine

## 2021-03-20 ENCOUNTER — Emergency Department (HOSPITAL_COMMUNITY)
Admission: EM | Admit: 2021-03-20 | Discharge: 2021-03-20 | Disposition: A | Discharge status: Home / Self Care | Payer: Medicare HMO | Attending: Emergency Medicine | Admitting: Emergency Medicine

## 2021-03-20 DIAGNOSIS — E1142 Type 2 diabetes mellitus with diabetic polyneuropathy: Secondary | ICD-10-CM | POA: Insufficient documentation

## 2021-03-20 DIAGNOSIS — W19XXXA Unspecified fall, initial encounter: Secondary | ICD-10-CM | POA: Insufficient documentation

## 2021-03-20 DIAGNOSIS — Y92009 Unspecified place in unspecified non-institutional (private) residence as the place of occurrence of the external cause: Secondary | ICD-10-CM | POA: Insufficient documentation

## 2021-03-20 DIAGNOSIS — S42331A Displaced oblique fracture of shaft of humerus, right arm, initial encounter for closed fracture: Secondary | ICD-10-CM | POA: Insufficient documentation

## 2021-03-20 DIAGNOSIS — I129 Hypertensive chronic kidney disease with stage 1 through stage 4 chronic kidney disease, or unspecified chronic kidney disease: Secondary | ICD-10-CM | POA: Insufficient documentation

## 2021-03-20 DIAGNOSIS — Z87891 Personal history of nicotine dependence: Secondary | ICD-10-CM | POA: Insufficient documentation

## 2021-03-20 DIAGNOSIS — Z794 Long term (current) use of insulin: Secondary | ICD-10-CM | POA: Insufficient documentation

## 2021-03-20 DIAGNOSIS — Z79899 Other long term (current) drug therapy: Secondary | ICD-10-CM | POA: Insufficient documentation

## 2021-03-20 DIAGNOSIS — S4991XA Unspecified injury of right shoulder and upper arm, initial encounter: Secondary | ICD-10-CM | POA: Diagnosis present

## 2021-03-20 DIAGNOSIS — Z96611 Presence of right artificial shoulder joint: Secondary | ICD-10-CM | POA: Diagnosis not present

## 2021-03-20 DIAGNOSIS — Z7901 Long term (current) use of anticoagulants: Secondary | ICD-10-CM | POA: Diagnosis not present

## 2021-03-20 DIAGNOSIS — S0990XA Unspecified injury of head, initial encounter: Secondary | ICD-10-CM | POA: Diagnosis not present

## 2021-03-20 DIAGNOSIS — N183 Chronic kidney disease, stage 3 unspecified: Secondary | ICD-10-CM | POA: Insufficient documentation

## 2021-03-20 DIAGNOSIS — Z96691 Finger-joint replacement of right hand: Secondary | ICD-10-CM | POA: Diagnosis not present

## 2021-03-20 DIAGNOSIS — Z96641 Presence of right artificial hip joint: Secondary | ICD-10-CM | POA: Insufficient documentation

## 2021-03-20 DIAGNOSIS — E1122 Type 2 diabetes mellitus with diabetic chronic kidney disease: Secondary | ICD-10-CM | POA: Insufficient documentation

## 2021-03-20 DIAGNOSIS — E039 Hypothyroidism, unspecified: Secondary | ICD-10-CM | POA: Diagnosis not present

## 2021-03-20 DIAGNOSIS — Z8603 Personal history of neoplasm of uncertain behavior: Secondary | ICD-10-CM | POA: Insufficient documentation

## 2021-03-20 DIAGNOSIS — Z96612 Presence of left artificial shoulder joint: Secondary | ICD-10-CM | POA: Insufficient documentation

## 2021-03-20 MED ORDER — HYDROCODONE-ACETAMINOPHEN 5-325 MG PO TABS: 1.0000 | ORAL_TABLET | Freq: Three times a day (TID) | ORAL | 0 refills | Status: AC | PRN

## 2021-03-20 MED ORDER — METHOCARBAMOL 500 MG PO TABS
500.0000 mg | ORAL_TABLET | Freq: Once | ORAL | Status: AC
  Administered 2021-03-20: 500 mg via ORAL
  Filled 2021-03-20: qty 1

## 2021-03-20 MED ORDER — FENTANYL CITRATE (PF) 100 MCG/2ML IJ SOLN
50.0000 ug | Freq: Once | INTRAMUSCULAR | Status: AC
  Administered 2021-03-20: 50 ug via INTRAVENOUS
  Filled 2021-03-20: qty 2

## 2021-03-20 MED ORDER — HYDROCODONE-ACETAMINOPHEN 5-325 MG PO TABS
1.0000 | ORAL_TABLET | Freq: Once | ORAL | Status: AC
  Administered 2021-03-20: 1 via ORAL
  Filled 2021-03-20: qty 1

## 2021-03-20 MED ORDER — TIZANIDINE HCL 4 MG PO TABS: 4.0000 mg | ORAL_TABLET | Freq: Four times a day (QID) | ORAL | 0 refills | Status: DC | PRN

## 2021-03-20 NOTE — Discharge Instructions (Signed)
You are seen in the ER after you had a fall.  The x-rays confirm a large fracture to your right arm.  Dr. Onnie Graham will be happy to see you in his clinic on Monday morning.  Address for his clinic has been provided.  Pain medications and muscle relaxants have been prescribed to help you manage the discomfort.  Please ice the arm 4-6 times a days for 10 min to keep the swelling down.  The pain medicine and muscle relaxant do make people drowsy -therefore be extra careful if you take those medications to prevent falls.

## 2021-03-20 NOTE — ED Notes (Signed)
Patient transported to X-ray 

## 2021-03-20 NOTE — ED Triage Notes (Signed)
Pt was going outside when the wind caught her door causing her to lose balance and fall. Pt with obvious deformity to R arm (humeral area) and c/o pain as 10/10. EMS gave pt 170mcg Fentanyl en route.

## 2021-03-20 NOTE — ED Provider Notes (Signed)
[facility] EMERGENCY DEPARTMENT Provider Note   CSN: [id number] Arrival date & time: 03/20/21  1928     History Chief Complaint  Patient presents with  . Fall    Angelica Wood is a 85 y.o. female.  HPI     85 year old female comes in a chief complaint of fall She has history of right shoulder replacement.  She reports that she had a mechanical fall outside her home because of the wind.  She fell with outstretched arm onto a wall.  She complains of pain in her right shoulder.  Patient denies pain elsewhere, but she is aware that she struck her head due to the fall and did not lose any consciousness.  Patient is not on any blood thinners.  Past Medical History:  Diagnosis Date  . Anemia    hx of  . Anxiety   . Arthritis   . Bronchitis    hx of  . Chronic fatigue   . Diabetes mellitus   . Fibromyalgia   . GERD (gastroesophageal reflux disease)   . Hypertension    [name]  . Hypothyroidism   . Irritable bowel syndrome (IBS)    hx of  . Migraine    hx of  . Pneumonia    hx of  . PONV (postoperative nausea and vomiting)   . Shortness of breath    with exertion  . Sleep apnea    Pt denies  . Thyroid disease   . Urgency of urination   . Urinary incontinence   . Vertigo    hx of    Patient Active Problem List   Diagnosis Date Noted  . Hyperlipidemia, unspecified 02/25/2021  . Change in bowel movement 09/05/2020  . Hematochezia 05/06/2020  . Osteoarthritis of left knee 04/15/2020  . Neck pain 03/05/2020  . B12 deficiency 01/18/2020  . Vitamin B12 deficiency anemia, unspecified 12/18/2019  . Fatigue 12/13/2019  . Body mass index (BMI) 26.0-26.9, adult 09/15/2019  . History of revision of total replacement of right hip joint 08/15/2019  . Type 2 diabetes mellitus with other specified complication ([place]) 17/00/1749  . HTN (hypertension) 08/04/2019  . Hypothyroidism 08/04/2019  . OA (osteoarthritis) of hip 08/01/2019  . Allergic rhinitis due to pollen  05/18/2019  . Major depressive disorder, recurrent, moderate ([place]) 05/18/2019  . Cellulitis of right toe 04/19/2019  . Diabetic foot infection ([place]) 04/19/2019  . Local infection of the skin and subcutaneous tissue, unspecified 04/19/2019  . Urticaria, unspecified 04/19/2019  . Pain in joint of right hip 01/19/2019  . Other specified polyneuropathies 08/18/2017  . Acute bronchitis, unspecified 06/03/2016  . Fever, unspecified 06/03/2016  . Hyperkalemia 05/11/2016  . Muscle tension dysphonia 01/16/2016  . Vocal cord atrophy 01/16/2016  . Low back pain 09/07/2015  . Infectious gastroenteritis and colitis, unspecified 08/19/2015  . Precordial pain 04/20/2015  . Dysuria 02/25/2015  . Other specified personal risk factors, not elsewhere classified 01/23/2015  . Cramp of limb 06/24/2014  . Stage 3 chronic kidney disease ([place]) 05/17/2014  . Pruritic disorder 02/21/2014  . Need for prophylactic vaccination and inoculation against influenza 01/09/2014  . Irritable colon 10/09/2013  . Reflux esophagitis 06/12/2013  . H/O total shoulder replacement 02/27/2013  . Pain in joint, shoulder region 02/27/2013  . Muscle weakness (generalized) 02/27/2013  . Allergic rhinitis 08/23/2012  . Inflamed seborrheic keratosis 08/23/2012  . Major depressive disorder, single episode, in remission ([place]) 08/23/2012  . Myalgia and myositis 08/23/2012  . Obstructive sleep apnea 08/23/2012  .  Polyneuropathy in diabetes ([place]) 08/23/2012  . Solitary pulmonary nodule 08/23/2012  . Type 2 (non-insulin dependent type) or unspecified type diabetes mellitus with neurological manifestations, uncontrolled 08/23/2012  . Shoulder arthritis 06/30/2012  . Localized, primary osteoarthritis of shoulder region 06/30/2012  . Disorder of shoulder 06/30/2012  . Cough 04/17/2012  . Neoplasm of uncertain behavior of skin 02/12/2012  . Shortness of breath 12/03/2011  . Open wound of scalp 09/22/2011  . Inflammatory and toxic  neuropathy ([place]) 05/28/2011    Past Surgical History:  Procedure Laterality Date  . ABDOMINAL HYSTERECTOMY    . APPENDECTOMY    . BIOPSY  09/26/2020   Procedure: BIOPSY;  Surgeon: Harvel Quale, MD;  Location: [facility];  Service: Gastroenterology;;  . CHOLECYSTECTOMY    . COLONOSCOPY W/ POLYPECTOMY    . COLONOSCOPY WITH PROPOFOL N/A 09/26/2020   Procedure: COLONOSCOPY WITH PROPOFOL;  Surgeon: Harvel Quale, MD;  Location: [facility];  Service: Gastroenterology;  Laterality: N/A;  215  . DILATION AND CURETTAGE OF UTERUS    . EYE SURGERY     right cataract removed, laser surgery for glaucoma  . JOINT REPLACEMENT Right    thumb  . POLYPECTOMY  09/26/2020   Procedure: POLYPECTOMY;  Surgeon: Harvel Quale, MD;  Location: [facility];  Service: Gastroenterology;;  . TONSILLECTOMY    . TOTAL HIP ARTHROPLASTY Right 08/01/2019   Procedure: TOTAL HIP ARTHROPLASTY ANTERIOR APPROACH;  Surgeon: Gaynelle Arabian, MD;  Location: [facility];  Service: Orthopedics;  Laterality: Right;  122min  . TOTAL SHOULDER ARTHROPLASTY  06/29/2012   Procedure: TOTAL SHOULDER ARTHROPLASTY;  Surgeon: Marin Shutter, MD;  Location: [place];  Service: Orthopedics;  Laterality: Left;  left total shoulder arthroplasty  . TOTAL SHOULDER ARTHROPLASTY Right 02/08/2013   Procedure: TOTAL SHOULDER ARTHROPLASTY;  Surgeon: Marin Shutter, MD;  Location: [place];  Service: Orthopedics;  Laterality: Right;     OB History   No obstetric history on file.     History reviewed. No pertinent family history.  Social History   Tobacco Use  . Smoking status: Former Smoker    Packs/day: 0.25    Years: 4.00    Pack years: 1.00    Types: Cigarettes    Quit date: 1970    Years since quitting: 52.3  . Smokeless tobacco: Never Used  Vaping Use  . Vaping Use: Never used  Substance Use Topics  . Alcohol use: No  . Drug use: No    Home Medications Prior to Admission medications    Medication Sig Start Date End Date Taking? Authorizing Provider  albuterol (PROVENTIL HFA;VENTOLIN HFA) 108 (90 BASE) MCG/ACT inhaler Inhale 2 puffs into the lungs every 6 (six) hours as needed for wheezing.     [provider]  Biotin 10000 MCG TABS Take 10,000 mcg by mouth daily.    [provider]  cetirizine (ZYRTEC) 10 MG tablet Take 10 mg by mouth daily.    [provider]  CINNAMON PO Take 2,000 mg by mouth daily.    [provider]  DULoxetine (CYMBALTA) 60 MG capsule Take 60 mg by mouth 2 (two) times daily.    [provider]  insulin detemir (LEVEMIR) 100 UNIT/ML injection Inject 0.1 mLs (10 Units total) into the skin at bedtime. Start on Sunday 08/05/19 at bedtime Patient taking differently: Inject 22 Units into the skin at bedtime. Start on Sunday 08/05/19 at bedtime 08/05/19   Roxan Hockey, MD  insulin lispro (HUMALOG  KWIKPEN) 100 UNIT/ML KwikPen Inject 0.03 mLs (3 Units total) into the skin 3 (three) times daily with meals. Patient taking differently: Inject 4 Units into the skin 3 (three) times daily with meals. 08/04/19   Roxan Hockey, MD  levothyroxine (SYNTHROID, LEVOTHROID) 100 MCG tablet Take 100 mcg by mouth daily before breakfast.     [provider]  losartan-hydrochlorothiazide (HYZAAR) 100-12.5 MG per tablet Take 1 tablet by mouth daily.    [provider]  methocarbamol (ROBAXIN) 500 MG tablet Take 1 tablet (500 mg total) by mouth every 6 (six) hours as needed for muscle spasms. 08/02/19   Constable, Amber, PA-C  metoprolol (LOPRESSOR) 50 MG tablet Take 50 mg by mouth daily.     [provider]  montelukast (SINGULAIR) 10 MG tablet Take 10 mg by mouth daily.     [provider]  OVER THE COUNTER MEDICATION daily. Immune + daily wellness    [provider]  rivaroxaban (XARELTO) 10 MG TABS tablet Take 1 tablet (10 mg total) by mouth daily. 08/02/19 07/31/20  Constable, Amber, PA-C     Allergies    Gabapentin, Adenosine, Glucophage [metformin hydrochloride], Meloxicam, Prednisone, Ace inhibitors, Clindamycin hcl, Macrodantin [nitrofurantoin macrocrystal], Sulfa antibiotics, and Tramadol  Review of Systems   Review of Systems  Constitutional: Positive for activity change.  Respiratory: Negative for shortness of breath.   Cardiovascular: Negative for chest pain.  Skin: Positive for wound.  Neurological: Negative for syncope.  All other systems reviewed and are negative.   Physical Exam Updated Vital Signs BP 116/86   Pulse 66   Temp 98.4 F (36.9 C) (Oral)   Resp 18   Ht 5\' 5"  (1.651 m)   Wt 80.7 kg   SpO2 100%   BMI 29.62 kg/m   Physical Exam Vitals and nursing note reviewed.  Constitutional:      Appearance: She is well-developed.  HENT:     Head: Normocephalic and atraumatic.  Neck:     Comments: No midline c-spine tenderness, pt able to turn head to 45 degrees bilaterally without any pain and able to flex neck to the chest and extend without any pain or neurologic symptoms.  Cardiovascular:     Rate and Rhythm: Normal rate.  Pulmonary:     Effort: Pulmonary effort is normal.  Abdominal:     General: Bowel sounds are normal.  Musculoskeletal:     Cervical back: Normal range of motion and neck supple.     Comments: Patient has deformity over the right arm, with some bulging of soft tissue otherwise:  Head to toe evaluation shows no hematoma, bleeding of the scalp, no facial abrasions, no spine step offs, crepitus of the chest or neck, no tenderness to palpation of the bilateral upper and lower extremities, no gross deformities, no chest tenderness, no pelvic pain.   Skin:    General: Skin is warm and dry.  Neurological:     Mental Status: She is alert and oriented to person, place, and time.     Sensory: No sensory deficit.     ED Results / Procedures / Treatments   Labs (all labs ordered are listed, but only abnormal results are  displayed) Labs Reviewed - No data to display  EKG None  Radiology DG Chest 1 View  Result Date: 03/20/2021 CLINICAL DATA:  Fall prior to arrival with right humerus pain. EXAM: CHEST  1 VIEW COMPARISON:  Radiograph 07/31/2020 FINDINGS: The cardiomediastinal contours are normal. Upper normal heart size. Streaky  left lung base opacities. Pulmonary vasculature is normal. No consolidation, pleural effusion, or pneumothorax. Bilateral proximal humeral arthroplasty. No acute rib fractures are seen. IMPRESSION: Streaky left lung base opacities, likely atelectasis. Electronically Signed   By: Keith Rake M.D.   On: 03/20/2021 20:36   CT Head Wo Contrast  Result Date: 03/20/2021 CLINICAL DATA:  Head trauma, mod-severe EXAM: CT HEAD WITHOUT CONTRAST TECHNIQUE: Contiguous axial images were obtained from the base of the skull through the vertex without intravenous contrast. COMPARISON:  Head CT 07/31/2020 FINDINGS: Brain: Brain volume is normal for age. No intracranial hemorrhage, mass effect, or midline shift. No hydrocephalus. The basilar cisterns are patent. No evidence of territorial infarct or acute ischemia. No extra-axial or intracranial fluid collection. Vascular: Atherosclerosis of skullbase vasculature without hyperdense vessel or abnormal calcification. Skull: No fracture or focal lesion. Sinuses/Orbits: Paranasal sinuses and mastoid air cells are clear. The visualized orbits are unremarkable. Bilateral cataract resection. Other: None. IMPRESSION: No acute intracranial abnormality. No skull fracture. Electronically Signed   By: Keith Rake M.D.   On: 03/20/2021 22:04   DG Humerus Right  Result Date: 03/20/2021 CLINICAL DATA:  Fall prior to arrival with right humerus pain. EXAM: RIGHT HUMERUS - 2+ VIEW COMPARISON:  None. FINDINGS: Proximal right humeral arthroplasty. There is a displaced fracture of the mid humeral shaft distal to the humeral stem. Lateral displacement of 12 mm of distal  fracture fragment. There is apex anterior angulation. Elbow alignment is grossly maintained. Bones are diffusely under mineralized. IMPRESSION: Displaced angulated angulated mid humeral shaft fracture distal to the humeral stem of proximal humeral arthroplasty. Electronically Signed   By: Keith Rake M.D.   On: 03/20/2021 20:35    Procedures Procedures   Medications Ordered in ED Medications  fentaNYL (SUBLIMAZE) injection 50 mcg (50 mcg Intravenous Given 03/20/21 2115)  HYDROcodone-acetaminophen (NORCO/VICODIN) 5-325 MG per tablet 1 tablet (1 tablet Oral Given 03/20/21 2213)  methocarbamol (ROBAXIN) tablet 500 mg (500 mg Oral Given 03/20/21 2213)    ED Course  I have reviewed the triage vital signs and the nursing notes.  Pertinent labs & imaging results that were available during my care of the patient were reviewed by me and considered in my medical decision making (see chart for details).  Clinical Course as of 03/20/21 2223  Fri Mar 20, 2021  2220 Discussed case with Dr. Onnie Graham, orthopedics.  We discussed the imaging finding.  He is aware that patient is neurovascularly intact.  He recommends that patient be seen in the clinic on Monday.  This recommendation has been discussed with the patient. [AN]    Clinical Course User Index [AN] Varney Biles, MD   MDM Rules/Calculators/A&P                          85 year old female comes in a chief complaint of mechanical fall. CT head ordered, it is negative. C-spine has been cleared clinically.  Patient is neurovascularly intact, but I suspect that she has midshaft humeral fracture, possibly tear in the deltoid or brachialis muscle.  X-ray ordered.  Final Clinical Impression(s) / ED Diagnoses Final diagnoses:  Closed displaced oblique fracture of shaft of right humerus, initial encounter    Rx / DC Orders ED Discharge Orders    None       Varney Biles, MD 03/20/21 2224

## 2021-03-20 NOTE — ED Notes (Signed)
Patient transported to CT 

## 2021-08-09 IMAGING — DX DG CHEST 1V PORT
1 series · 1 of 1 positions shown · non-contrast
Comparison: 03/03/2020.

CLINICAL DATA: Fall.

EXAM:
PORTABLE CHEST 1 VIEW

[chest ap]
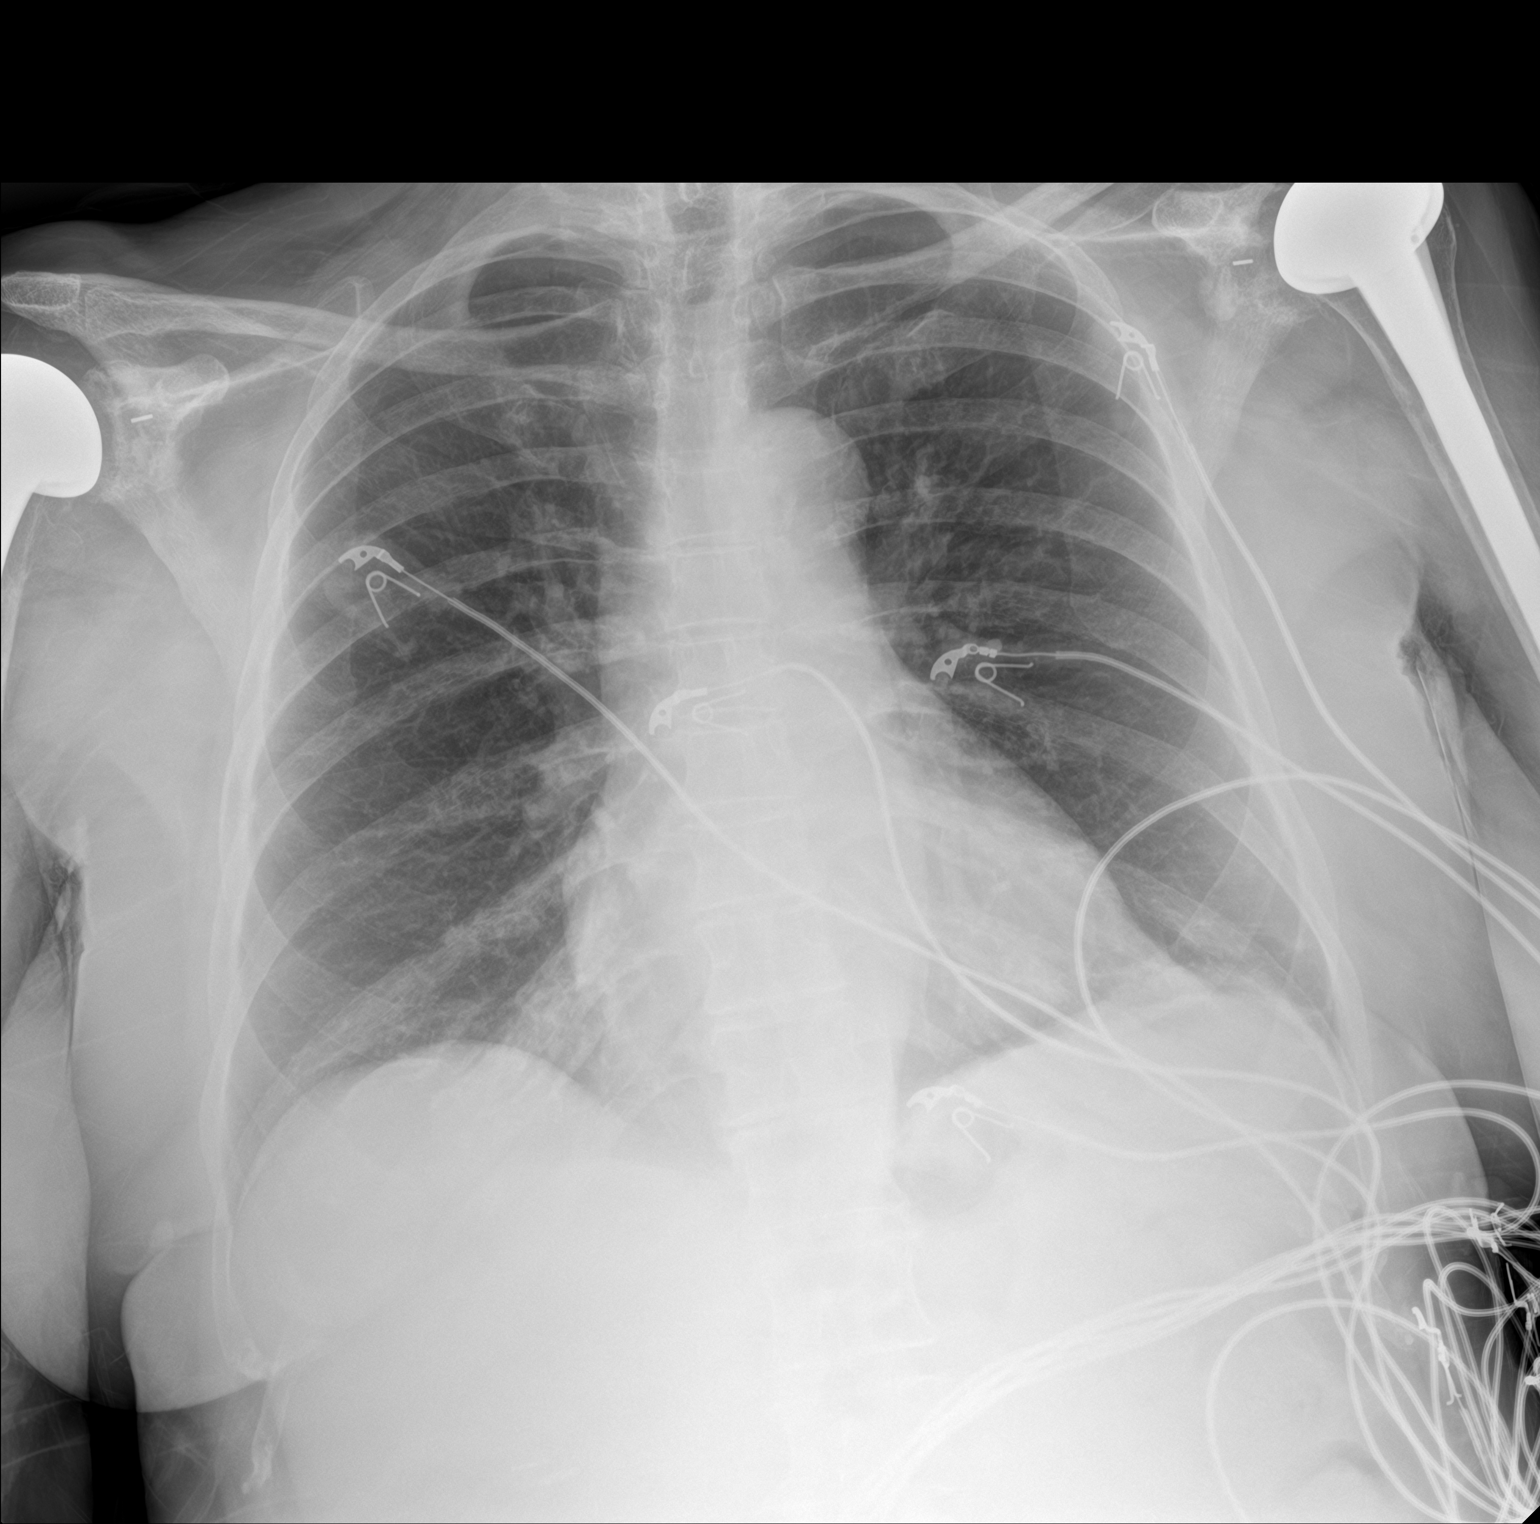

[1 of 1 positions shown; findings below may reference images not displayed]

FINDINGS: Mediastinum and hilar structures normal. Heart size normal. Mild
bibasilar atelectasis. No pleural effusion or pneumothorax. Thoracic
spine scoliosis concave left. Bilateral shoulder replacements. No
acute bony abnormality.
IMPRESSION: 1.  Mild bibasilar atelectasis.  No pneumothorax.

2. Thoracic spine scoliosis concave left. Bilateral shoulder
replacements. No acute bony abnormality identified.

## 2021-08-28 DIAGNOSIS — E1165 Type 2 diabetes mellitus with hyperglycemia: Secondary | ICD-10-CM | POA: Diagnosis not present

## 2021-08-28 DIAGNOSIS — N1831 Chronic kidney disease, stage 3a: Secondary | ICD-10-CM | POA: Diagnosis not present

## 2021-08-28 DIAGNOSIS — N3941 Urge incontinence: Secondary | ICD-10-CM | POA: Diagnosis not present

## 2021-08-28 DIAGNOSIS — E782 Mixed hyperlipidemia: Secondary | ICD-10-CM | POA: Diagnosis not present

## 2021-08-28 DIAGNOSIS — E039 Hypothyroidism, unspecified: Secondary | ICD-10-CM | POA: Diagnosis not present

## 2021-08-28 DIAGNOSIS — I10 Essential (primary) hypertension: Secondary | ICD-10-CM | POA: Diagnosis not present

## 2021-12-23 DIAGNOSIS — E1165 Type 2 diabetes mellitus with hyperglycemia: Secondary | ICD-10-CM | POA: Diagnosis not present

## 2021-12-23 DIAGNOSIS — N1831 Chronic kidney disease, stage 3a: Secondary | ICD-10-CM | POA: Diagnosis not present

## 2021-12-23 DIAGNOSIS — E782 Mixed hyperlipidemia: Secondary | ICD-10-CM | POA: Diagnosis not present

## 2021-12-23 DIAGNOSIS — I10 Essential (primary) hypertension: Secondary | ICD-10-CM | POA: Diagnosis not present

## 2021-12-23 DIAGNOSIS — M797 Fibromyalgia: Secondary | ICD-10-CM | POA: Diagnosis not present

## 2021-12-23 DIAGNOSIS — E039 Hypothyroidism, unspecified: Secondary | ICD-10-CM | POA: Diagnosis not present

## 2022-01-20 DIAGNOSIS — M7731 Calcaneal spur, right foot: Secondary | ICD-10-CM | POA: Diagnosis not present

## 2022-01-20 DIAGNOSIS — M19079 Primary osteoarthritis, unspecified ankle and foot: Secondary | ICD-10-CM | POA: Diagnosis not present

## 2022-01-20 DIAGNOSIS — E1140 Type 2 diabetes mellitus with diabetic neuropathy, unspecified: Secondary | ICD-10-CM | POA: Diagnosis not present

## 2022-01-20 DIAGNOSIS — M79671 Pain in right foot: Secondary | ICD-10-CM | POA: Diagnosis not present

## 2022-02-09 DIAGNOSIS — I10 Essential (primary) hypertension: Secondary | ICD-10-CM | POA: Diagnosis not present

## 2022-02-09 DIAGNOSIS — N08 Glomerular disorders in diseases classified elsewhere: Secondary | ICD-10-CM | POA: Diagnosis not present

## 2022-02-09 DIAGNOSIS — E1100 Type 2 diabetes mellitus with hyperosmolarity without nonketotic hyperglycemic-hyperosmolar coma (NKHHC): Secondary | ICD-10-CM | POA: Diagnosis not present

## 2022-03-24 DIAGNOSIS — M797 Fibromyalgia: Secondary | ICD-10-CM | POA: Diagnosis not present

## 2022-03-24 DIAGNOSIS — N1831 Chronic kidney disease, stage 3a: Secondary | ICD-10-CM | POA: Diagnosis not present

## 2022-03-24 DIAGNOSIS — E1165 Type 2 diabetes mellitus with hyperglycemia: Secondary | ICD-10-CM | POA: Diagnosis not present

## 2022-03-24 DIAGNOSIS — I10 Essential (primary) hypertension: Secondary | ICD-10-CM | POA: Diagnosis not present

## 2022-03-24 DIAGNOSIS — E039 Hypothyroidism, unspecified: Secondary | ICD-10-CM | POA: Diagnosis not present

## 2022-03-24 DIAGNOSIS — Z1231 Encounter for screening mammogram for malignant neoplasm of breast: Secondary | ICD-10-CM | POA: Diagnosis not present

## 2022-03-24 DIAGNOSIS — E782 Mixed hyperlipidemia: Secondary | ICD-10-CM | POA: Diagnosis not present

## 2022-03-25 ENCOUNTER — Other Ambulatory Visit (HOSPITAL_COMMUNITY): Payer: Self-pay | Admitting: Family Medicine

## 2022-03-25 DIAGNOSIS — Z1231 Encounter for screening mammogram for malignant neoplasm of breast: Secondary | ICD-10-CM

## 2022-03-29 IMAGING — CT CT HEAD W/O CM
3 series · 15 of 47 positions shown, 18 images · non-contrast
Comparison: Head CT 07/31/2020

CLINICAL DATA: Head trauma, mod-severe

EXAM:
CT HEAD WITHOUT CONTRAST
TECHNIQUE: Contiguous axial images were obtained from the base of the skull
through the vertex without intravenous contrast.

[Series 2: head w o · axial · 0.39mm/px · z∈[+78,+203]mm · 9 of 31 slices shown, 12 images]
[im 3/31  brain]
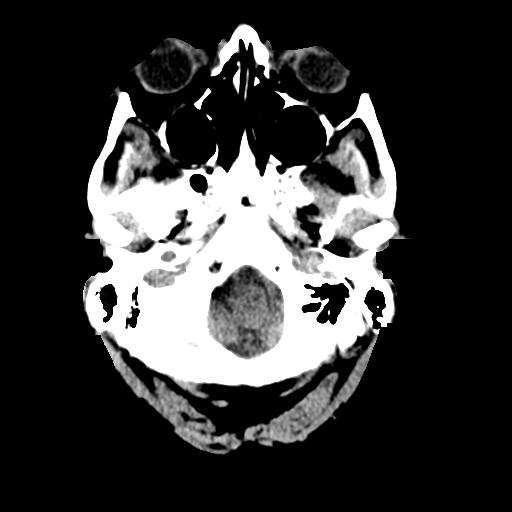
[im 3/31  bone]
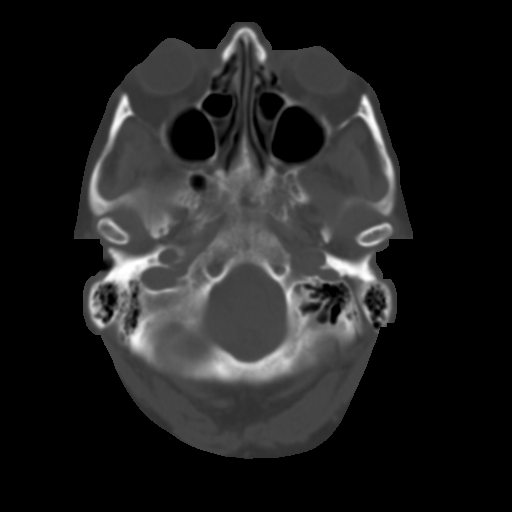
[im 6/31  brain]
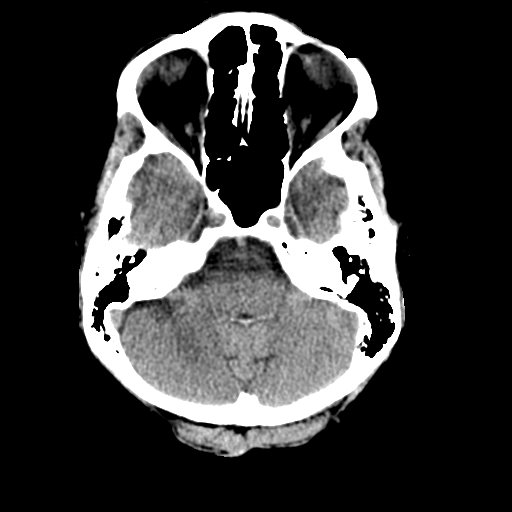
[im 9/31  brain]
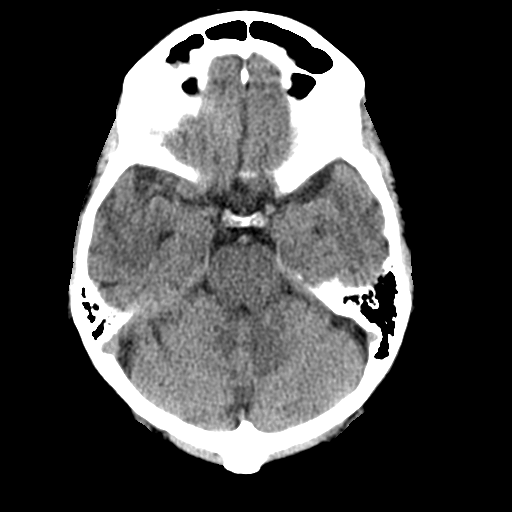
[im 12/31  brain]
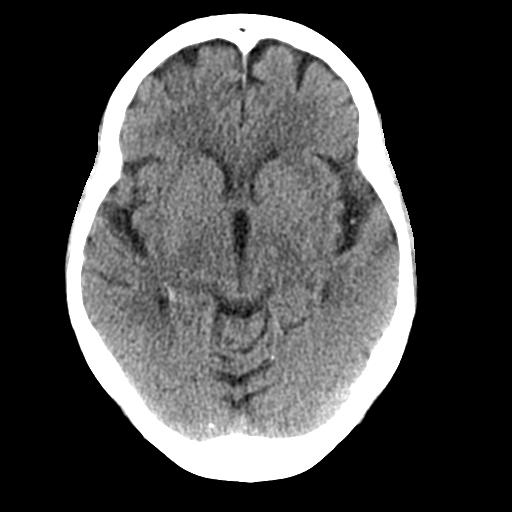
[im 16/31  brain]
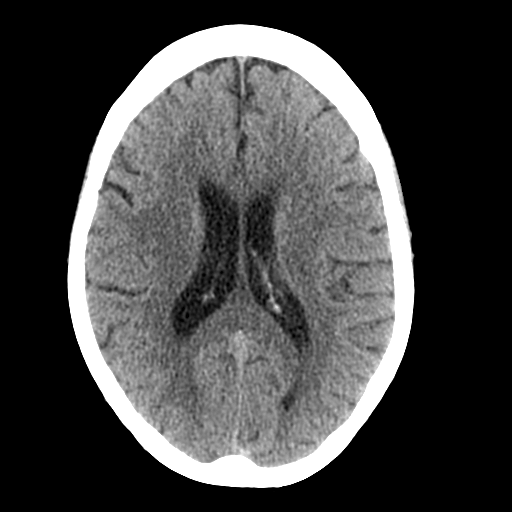
[im 16/31  bone]
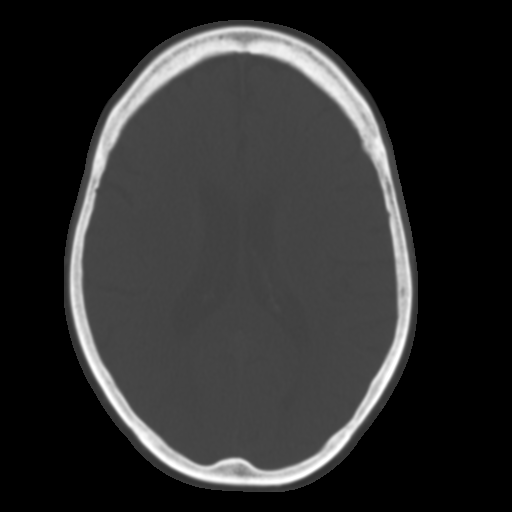
[im 19/31  brain]
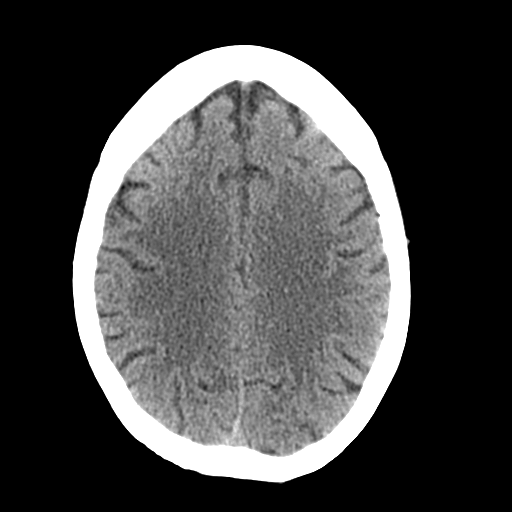
[im 22/31  brain]
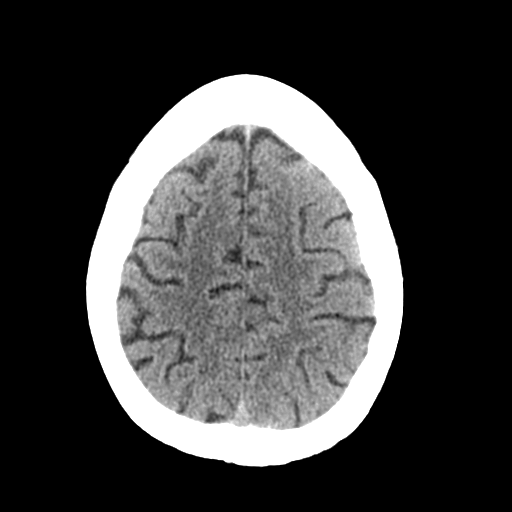
[im 25/31  brain]
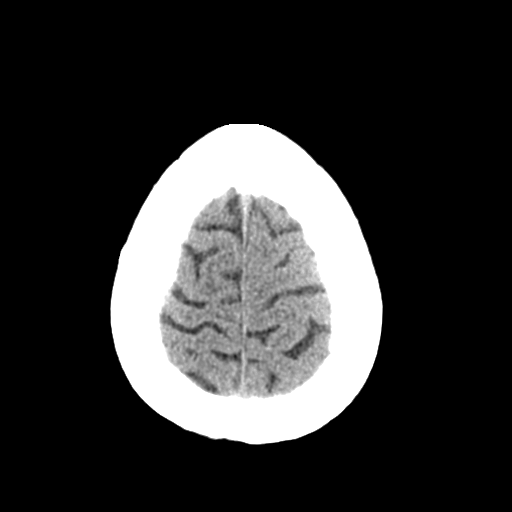
[im 28/31  brain]
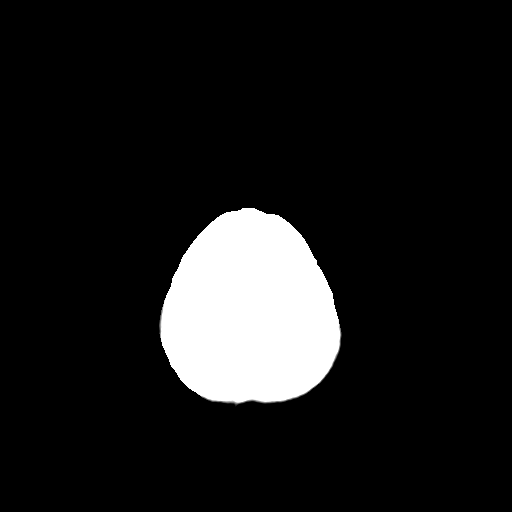
[im 28/31  bone]
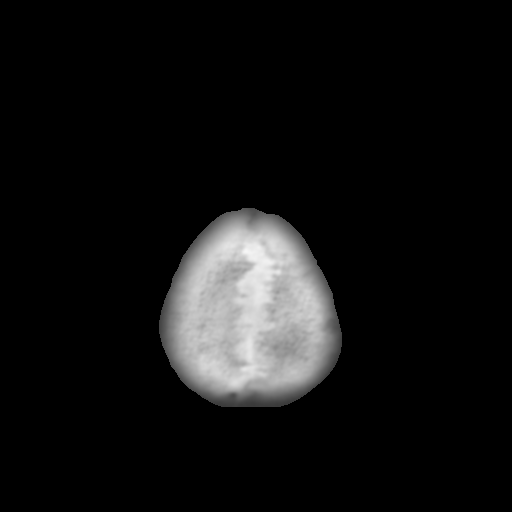

[Series 4: coronal soft · coronal · 0.31mm/px · 3 of 66 slices shown]
[im 22/66  brain]
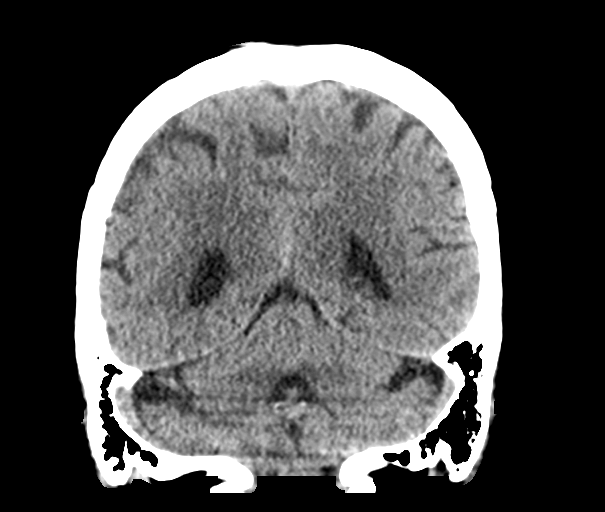
[im 29/66  brain]
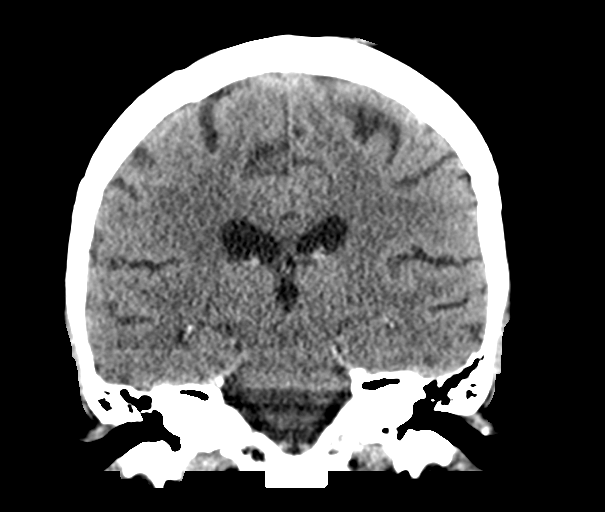
[im 37/66  brain]
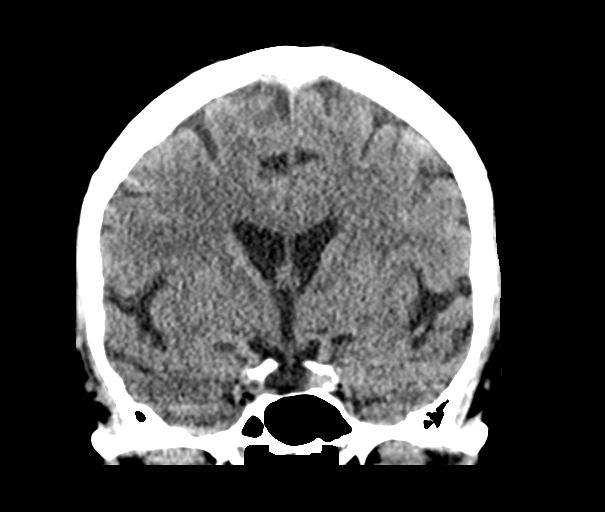

[Series 5: sagittal soft · sagittal · 0.31mm/px · 3 of 52 slices shown]
[im 18/52  brain]
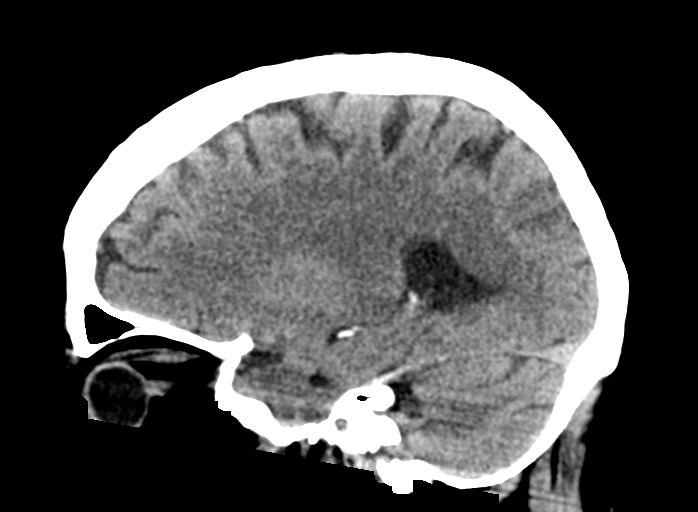
[im 26/52  brain]
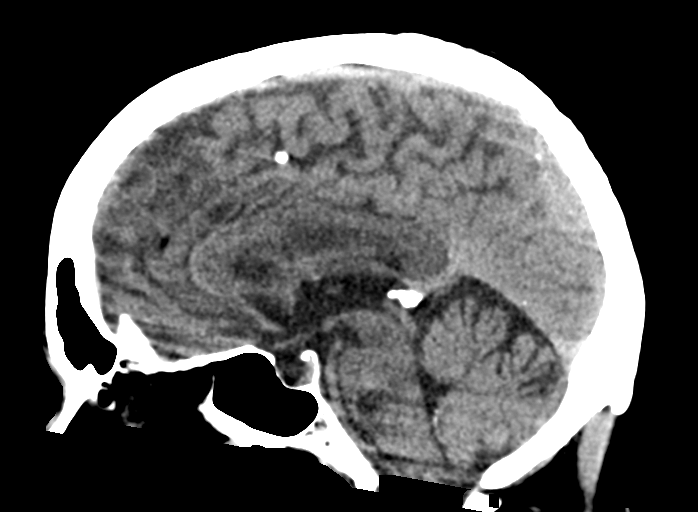
[im 35/52  brain]
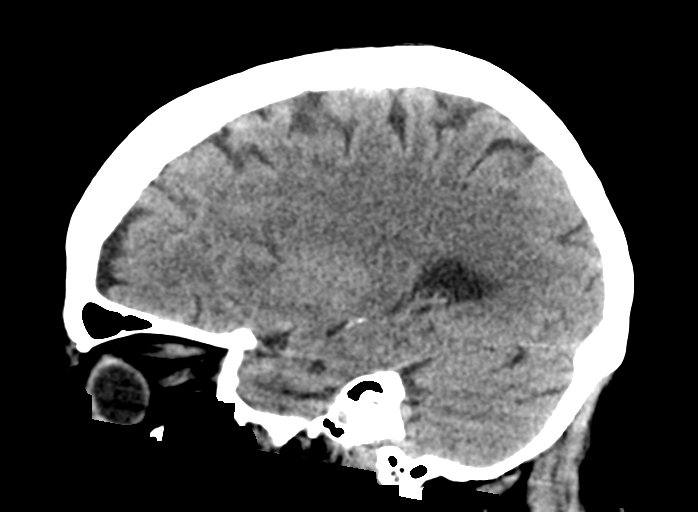

[15 of 47 positions shown; findings below may reference images not displayed]

FINDINGS: Brain: Brain volume is normal for age. No intracranial hemorrhage,
mass effect, or midline shift. No hydrocephalus. The basilar
cisterns are patent. No evidence of territorial infarct or acute
ischemia. No extra-axial or intracranial fluid collection.

Vascular: Atherosclerosis of skullbase vasculature without
hyperdense vessel or abnormal calcification.

Skull: No fracture or focal lesion.

Sinuses/Orbits: Paranasal sinuses and mastoid air cells are clear.
The visualized orbits are unremarkable. Bilateral cataract
resection.

Other: None.
IMPRESSION: No acute intracranial abnormality. No skull fracture.

## 2022-04-05 ENCOUNTER — Encounter (HOSPITAL_COMMUNITY): Payer: Self-pay

## 2022-04-05 ENCOUNTER — Ambulatory Visit (HOSPITAL_COMMUNITY)
Admission: RE | Admit: 2022-04-05 | Discharge: 2022-04-05 | Disposition: A | Discharge status: Home / Self Care | Payer: Medicare HMO | Source: Ambulatory Visit | Attending: Family Medicine | Admitting: Family Medicine

## 2022-04-05 DIAGNOSIS — Z1231 Encounter for screening mammogram for malignant neoplasm of breast: Secondary | ICD-10-CM | POA: Insufficient documentation

## 2022-04-12 ENCOUNTER — Ambulatory Visit (HOSPITAL_COMMUNITY)
Admission: RE | Admit: 2022-04-12 | Discharge: 2022-04-12 | Disposition: A | Discharge status: Home / Self Care | Payer: Medicare HMO | Source: Ambulatory Visit | Attending: Family Medicine | Admitting: Family Medicine

## 2022-04-12 DIAGNOSIS — Z1231 Encounter for screening mammogram for malignant neoplasm of breast: Secondary | ICD-10-CM | POA: Diagnosis not present

## 2022-04-14 DIAGNOSIS — M2010 Hallux valgus (acquired), unspecified foot: Secondary | ICD-10-CM | POA: Diagnosis not present

## 2022-04-14 DIAGNOSIS — M7730 Calcaneal spur, unspecified foot: Secondary | ICD-10-CM | POA: Diagnosis not present

## 2022-04-14 DIAGNOSIS — M79673 Pain in unspecified foot: Secondary | ICD-10-CM | POA: Diagnosis not present

## 2022-04-14 DIAGNOSIS — E1140 Type 2 diabetes mellitus with diabetic neuropathy, unspecified: Secondary | ICD-10-CM | POA: Diagnosis not present

## 2022-04-26 DIAGNOSIS — E119 Type 2 diabetes mellitus without complications: Secondary | ICD-10-CM | POA: Diagnosis not present

## 2022-04-28 DIAGNOSIS — L308 Other specified dermatitis: Secondary | ICD-10-CM | POA: Diagnosis not present

## 2022-04-28 DIAGNOSIS — L820 Inflamed seborrheic keratosis: Secondary | ICD-10-CM | POA: Diagnosis not present

## 2022-05-27 DIAGNOSIS — E119 Type 2 diabetes mellitus without complications: Secondary | ICD-10-CM | POA: Diagnosis not present

## 2022-06-07 DIAGNOSIS — E039 Hypothyroidism, unspecified: Secondary | ICD-10-CM | POA: Diagnosis not present

## 2022-06-07 DIAGNOSIS — E1142 Type 2 diabetes mellitus with diabetic polyneuropathy: Secondary | ICD-10-CM | POA: Diagnosis not present

## 2022-06-07 DIAGNOSIS — E1165 Type 2 diabetes mellitus with hyperglycemia: Secondary | ICD-10-CM | POA: Diagnosis not present

## 2022-06-07 DIAGNOSIS — E1122 Type 2 diabetes mellitus with diabetic chronic kidney disease: Secondary | ICD-10-CM | POA: Diagnosis not present

## 2022-06-07 DIAGNOSIS — G4733 Obstructive sleep apnea (adult) (pediatric): Secondary | ICD-10-CM | POA: Diagnosis not present

## 2022-06-07 DIAGNOSIS — M7918 Myalgia, other site: Secondary | ICD-10-CM | POA: Diagnosis not present

## 2022-06-07 DIAGNOSIS — E7849 Other hyperlipidemia: Secondary | ICD-10-CM | POA: Diagnosis not present

## 2022-06-07 DIAGNOSIS — M1611 Unilateral primary osteoarthritis, right hip: Secondary | ICD-10-CM | POA: Diagnosis not present

## 2022-06-07 DIAGNOSIS — R5383 Other fatigue: Secondary | ICD-10-CM | POA: Diagnosis not present

## 2022-06-07 DIAGNOSIS — M797 Fibromyalgia: Secondary | ICD-10-CM | POA: Diagnosis not present

## 2022-06-07 DIAGNOSIS — K2100 Gastro-esophageal reflux disease with esophagitis, without bleeding: Secondary | ICD-10-CM | POA: Diagnosis not present

## 2022-06-07 DIAGNOSIS — I10 Essential (primary) hypertension: Secondary | ICD-10-CM | POA: Diagnosis not present

## 2022-06-27 DIAGNOSIS — E119 Type 2 diabetes mellitus without complications: Secondary | ICD-10-CM | POA: Diagnosis not present

## 2022-06-30 DIAGNOSIS — E1165 Type 2 diabetes mellitus with hyperglycemia: Secondary | ICD-10-CM | POA: Diagnosis not present

## 2022-06-30 DIAGNOSIS — M797 Fibromyalgia: Secondary | ICD-10-CM | POA: Diagnosis not present

## 2022-06-30 DIAGNOSIS — I10 Essential (primary) hypertension: Secondary | ICD-10-CM | POA: Diagnosis not present

## 2022-06-30 DIAGNOSIS — E7800 Pure hypercholesterolemia, unspecified: Secondary | ICD-10-CM | POA: Diagnosis not present

## 2022-06-30 DIAGNOSIS — N1831 Chronic kidney disease, stage 3a: Secondary | ICD-10-CM | POA: Diagnosis not present

## 2022-06-30 DIAGNOSIS — E039 Hypothyroidism, unspecified: Secondary | ICD-10-CM | POA: Diagnosis not present

## 2022-06-30 LAB — HEMOGLOBIN A1C: Hemoglobin A1C: 7.9

## 2022-06-30 LAB — BASIC METABOLIC PANEL
BUN: 20 (ref 4–21)
BUN: 20 (ref 4–21)
Creatinine: 1.1 (ref 0.5–1.1)
Creatinine: 1.1 (ref 0.5–1.1)
Glucose: 183
Glucose: 183

## 2022-06-30 LAB — LIPID PANEL
LDL Cholesterol: 126
LDL Cholesterol: 126
Triglycerides: 175 — AB (ref 40–160)
Triglycerides: 175 — AB (ref 40–160)

## 2022-06-30 LAB — MICROALBUMIN, URINE: Microalb, Ur: 8

## 2022-06-30 LAB — COMPREHENSIVE METABOLIC PANEL
eGFR: 52
eGFR: 52

## 2022-06-30 LAB — TSH: TSH: 1.49 (ref 0.41–5.90)

## 2022-07-15 DIAGNOSIS — M7730 Calcaneal spur, unspecified foot: Secondary | ICD-10-CM | POA: Diagnosis not present

## 2022-07-15 DIAGNOSIS — M79673 Pain in unspecified foot: Secondary | ICD-10-CM | POA: Diagnosis not present

## 2022-07-15 DIAGNOSIS — E1140 Type 2 diabetes mellitus with diabetic neuropathy, unspecified: Secondary | ICD-10-CM | POA: Diagnosis not present

## 2022-07-15 DIAGNOSIS — L600 Ingrowing nail: Secondary | ICD-10-CM | POA: Diagnosis not present

## 2022-07-20 DIAGNOSIS — E119 Type 2 diabetes mellitus without complications: Secondary | ICD-10-CM | POA: Diagnosis not present

## 2022-07-20 DIAGNOSIS — Z794 Long term (current) use of insulin: Secondary | ICD-10-CM | POA: Diagnosis not present

## 2022-07-30 DIAGNOSIS — R410 Disorientation, unspecified: Secondary | ICD-10-CM | POA: Diagnosis not present

## 2022-07-30 DIAGNOSIS — R739 Hyperglycemia, unspecified: Secondary | ICD-10-CM | POA: Diagnosis not present

## 2022-07-30 DIAGNOSIS — R6889 Other general symptoms and signs: Secondary | ICD-10-CM | POA: Diagnosis not present

## 2022-07-30 DIAGNOSIS — S0990XA Unspecified injury of head, initial encounter: Secondary | ICD-10-CM | POA: Diagnosis not present

## 2022-07-30 DIAGNOSIS — Z743 Need for continuous supervision: Secondary | ICD-10-CM | POA: Diagnosis not present

## 2022-07-31 DIAGNOSIS — S8011XA Contusion of right lower leg, initial encounter: Secondary | ICD-10-CM | POA: Diagnosis not present

## 2022-07-31 DIAGNOSIS — S7011XA Contusion of right thigh, initial encounter: Secondary | ICD-10-CM | POA: Diagnosis not present

## 2022-07-31 DIAGNOSIS — Y9389 Activity, other specified: Secondary | ICD-10-CM | POA: Diagnosis not present

## 2022-07-31 DIAGNOSIS — S79912A Unspecified injury of left hip, initial encounter: Secondary | ICD-10-CM | POA: Diagnosis not present

## 2022-07-31 DIAGNOSIS — S065X0A Traumatic subdural hemorrhage without loss of consciousness, initial encounter: Secondary | ICD-10-CM | POA: Diagnosis not present

## 2022-07-31 DIAGNOSIS — E119 Type 2 diabetes mellitus without complications: Secondary | ICD-10-CM | POA: Diagnosis not present

## 2022-07-31 DIAGNOSIS — S3991XA Unspecified injury of abdomen, initial encounter: Secondary | ICD-10-CM | POA: Diagnosis not present

## 2022-07-31 DIAGNOSIS — S065XAA Traumatic subdural hemorrhage with loss of consciousness status unknown, initial encounter: Secondary | ICD-10-CM | POA: Diagnosis not present

## 2022-07-31 DIAGNOSIS — Z96641 Presence of right artificial hip joint: Secondary | ICD-10-CM | POA: Diagnosis not present

## 2022-07-31 DIAGNOSIS — Z7984 Long term (current) use of oral hypoglycemic drugs: Secondary | ICD-10-CM | POA: Diagnosis not present

## 2022-07-31 DIAGNOSIS — S79921A Unspecified injury of right thigh, initial encounter: Secondary | ICD-10-CM | POA: Diagnosis not present

## 2022-07-31 DIAGNOSIS — S7001XA Contusion of right hip, initial encounter: Secondary | ICD-10-CM | POA: Diagnosis not present

## 2022-07-31 DIAGNOSIS — G9340 Encephalopathy, unspecified: Secondary | ICD-10-CM | POA: Diagnosis not present

## 2022-07-31 DIAGNOSIS — S199XXA Unspecified injury of neck, initial encounter: Secondary | ICD-10-CM | POA: Diagnosis not present

## 2022-07-31 DIAGNOSIS — Z87891 Personal history of nicotine dependence: Secondary | ICD-10-CM | POA: Diagnosis not present

## 2022-07-31 DIAGNOSIS — I6201 Nontraumatic acute subdural hemorrhage: Secondary | ICD-10-CM | POA: Diagnosis not present

## 2022-07-31 DIAGNOSIS — S065X9A Traumatic subdural hemorrhage with loss of consciousness of unspecified duration, initial encounter: Secondary | ICD-10-CM | POA: Diagnosis not present

## 2022-07-31 DIAGNOSIS — M797 Fibromyalgia: Secondary | ICD-10-CM | POA: Diagnosis not present

## 2022-07-31 DIAGNOSIS — E039 Hypothyroidism, unspecified: Secondary | ICD-10-CM | POA: Diagnosis not present

## 2022-07-31 DIAGNOSIS — D62 Acute posthemorrhagic anemia: Secondary | ICD-10-CM | POA: Diagnosis not present

## 2022-07-31 DIAGNOSIS — K589 Irritable bowel syndrome without diarrhea: Secondary | ICD-10-CM | POA: Diagnosis not present

## 2022-07-31 DIAGNOSIS — R519 Headache, unspecified: Secondary | ICD-10-CM | POA: Diagnosis not present

## 2022-07-31 DIAGNOSIS — S299XXA Unspecified injury of thorax, initial encounter: Secondary | ICD-10-CM | POA: Diagnosis not present

## 2022-07-31 DIAGNOSIS — R9431 Abnormal electrocardiogram [ECG] [EKG]: Secondary | ICD-10-CM | POA: Diagnosis not present

## 2022-07-31 DIAGNOSIS — Y92410 Unspecified street and highway as the place of occurrence of the external cause: Secondary | ICD-10-CM | POA: Diagnosis not present

## 2022-07-31 DIAGNOSIS — Z79899 Other long term (current) drug therapy: Secondary | ICD-10-CM | POA: Diagnosis not present

## 2022-07-31 DIAGNOSIS — I4510 Unspecified right bundle-branch block: Secondary | ICD-10-CM | POA: Diagnosis not present

## 2022-07-31 DIAGNOSIS — M545 Low back pain, unspecified: Secondary | ICD-10-CM | POA: Diagnosis not present

## 2022-07-31 DIAGNOSIS — K219 Gastro-esophageal reflux disease without esophagitis: Secondary | ICD-10-CM | POA: Diagnosis not present

## 2022-07-31 DIAGNOSIS — Z794 Long term (current) use of insulin: Secondary | ICD-10-CM | POA: Diagnosis not present

## 2022-08-01 DIAGNOSIS — E162 Hypoglycemia, unspecified: Secondary | ICD-10-CM | POA: Diagnosis not present

## 2022-08-01 DIAGNOSIS — S7001XA Contusion of right hip, initial encounter: Secondary | ICD-10-CM | POA: Diagnosis not present

## 2022-08-01 DIAGNOSIS — S065XAA Traumatic subdural hemorrhage with loss of consciousness status unknown, initial encounter: Secondary | ICD-10-CM | POA: Diagnosis not present

## 2022-08-01 DIAGNOSIS — E11649 Type 2 diabetes mellitus with hypoglycemia without coma: Secondary | ICD-10-CM | POA: Diagnosis not present

## 2022-08-01 DIAGNOSIS — R58 Hemorrhage, not elsewhere classified: Secondary | ICD-10-CM | POA: Diagnosis not present

## 2022-08-01 DIAGNOSIS — M79652 Pain in left thigh: Secondary | ICD-10-CM | POA: Diagnosis not present

## 2022-08-01 DIAGNOSIS — Z794 Long term (current) use of insulin: Secondary | ICD-10-CM | POA: Diagnosis not present

## 2022-08-01 DIAGNOSIS — M25522 Pain in left elbow: Secondary | ICD-10-CM | POA: Diagnosis not present

## 2022-08-01 DIAGNOSIS — R350 Frequency of micturition: Secondary | ICD-10-CM | POA: Diagnosis not present

## 2022-08-10 DIAGNOSIS — M797 Fibromyalgia: Secondary | ICD-10-CM | POA: Diagnosis not present

## 2022-08-10 DIAGNOSIS — E1165 Type 2 diabetes mellitus with hyperglycemia: Secondary | ICD-10-CM | POA: Diagnosis not present

## 2022-08-10 DIAGNOSIS — S065XAA Traumatic subdural hemorrhage with loss of consciousness status unknown, initial encounter: Secondary | ICD-10-CM | POA: Diagnosis not present

## 2022-08-10 DIAGNOSIS — R262 Difficulty in walking, not elsewhere classified: Secondary | ICD-10-CM | POA: Diagnosis not present

## 2022-08-10 DIAGNOSIS — I10 Essential (primary) hypertension: Secondary | ICD-10-CM | POA: Diagnosis not present

## 2022-08-10 DIAGNOSIS — T148XXA Other injury of unspecified body region, initial encounter: Secondary | ICD-10-CM | POA: Diagnosis not present

## 2022-08-10 DIAGNOSIS — S7011XD Contusion of right thigh, subsequent encounter: Secondary | ICD-10-CM | POA: Diagnosis not present

## 2022-08-10 DIAGNOSIS — R0602 Shortness of breath: Secondary | ICD-10-CM | POA: Diagnosis not present

## 2022-08-10 DIAGNOSIS — Z09 Encounter for follow-up examination after completed treatment for conditions other than malignant neoplasm: Secondary | ICD-10-CM | POA: Diagnosis not present

## 2022-08-10 DIAGNOSIS — M533 Sacrococcygeal disorders, not elsewhere classified: Secondary | ICD-10-CM | POA: Diagnosis not present

## 2022-08-20 DIAGNOSIS — R262 Difficulty in walking, not elsewhere classified: Secondary | ICD-10-CM | POA: Diagnosis not present

## 2022-09-24 ENCOUNTER — Encounter: Payer: Medicare PPO | Admitting: Nurse Practitioner

## 2022-09-24 ENCOUNTER — Encounter: Payer: Self-pay | Admitting: Nurse Practitioner

## 2022-09-24 NOTE — Patient Instructions (Signed)

## 2022-09-28 NOTE — Progress Notes (Signed)
Erroneous encounter

## 2022-10-04 DIAGNOSIS — J029 Acute pharyngitis, unspecified: Secondary | ICD-10-CM | POA: Diagnosis not present

## 2022-10-04 DIAGNOSIS — E7800 Pure hypercholesterolemia, unspecified: Secondary | ICD-10-CM | POA: Diagnosis not present

## 2022-10-04 DIAGNOSIS — N1831 Chronic kidney disease, stage 3a: Secondary | ICD-10-CM | POA: Diagnosis not present

## 2022-10-04 DIAGNOSIS — H9313 Tinnitus, bilateral: Secondary | ICD-10-CM | POA: Diagnosis not present

## 2022-10-04 DIAGNOSIS — I10 Essential (primary) hypertension: Secondary | ICD-10-CM | POA: Diagnosis not present

## 2022-10-04 DIAGNOSIS — E039 Hypothyroidism, unspecified: Secondary | ICD-10-CM | POA: Diagnosis not present

## 2022-10-04 DIAGNOSIS — M797 Fibromyalgia: Secondary | ICD-10-CM | POA: Diagnosis not present

## 2022-10-04 DIAGNOSIS — E1165 Type 2 diabetes mellitus with hyperglycemia: Secondary | ICD-10-CM | POA: Diagnosis not present

## 2022-10-12 DIAGNOSIS — L918 Other hypertrophic disorders of the skin: Secondary | ICD-10-CM | POA: Diagnosis not present

## 2022-10-12 DIAGNOSIS — L298 Other pruritus: Secondary | ICD-10-CM | POA: Diagnosis not present

## 2022-10-12 DIAGNOSIS — L821 Other seborrheic keratosis: Secondary | ICD-10-CM | POA: Diagnosis not present

## 2022-10-12 DIAGNOSIS — L570 Actinic keratosis: Secondary | ICD-10-CM | POA: Diagnosis not present

## 2022-10-13 NOTE — Patient Instructions (Signed)

## 2022-10-14 ENCOUNTER — Ambulatory Visit (INDEPENDENT_AMBULATORY_CARE_PROVIDER_SITE_OTHER): Payer: Medicare PPO | Admitting: Nurse Practitioner

## 2022-10-14 ENCOUNTER — Encounter: Payer: Self-pay | Admitting: Nurse Practitioner

## 2022-10-14 VITALS — BP 138/75 | HR 79 | Ht 65.0 in | Wt 158.8 lb

## 2022-10-14 DIAGNOSIS — N1831 Chronic kidney disease, stage 3a: Secondary | ICD-10-CM

## 2022-10-14 DIAGNOSIS — Z794 Long term (current) use of insulin: Secondary | ICD-10-CM | POA: Diagnosis not present

## 2022-10-14 DIAGNOSIS — E1122 Type 2 diabetes mellitus with diabetic chronic kidney disease: Secondary | ICD-10-CM | POA: Diagnosis not present

## 2022-10-14 MED ORDER — INSULIN DETEMIR 100 UNIT/ML ~~LOC~~ SOLN: 25.0000 [IU] | Freq: Every day | SUBCUTANEOUS | 11 refills | Status: DC
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
## 2022-10-18 DIAGNOSIS — H903 Sensorineural hearing loss, bilateral: Secondary | ICD-10-CM | POA: Diagnosis not present

## 2022-10-18 DIAGNOSIS — H9313 Tinnitus, bilateral: Secondary | ICD-10-CM | POA: Diagnosis not present

## 2022-11-16 ENCOUNTER — Ambulatory Visit (INDEPENDENT_AMBULATORY_CARE_PROVIDER_SITE_OTHER): Payer: Medicare PPO | Admitting: Nurse Practitioner

## 2022-11-16 ENCOUNTER — Encounter: Payer: Self-pay | Admitting: Nurse Practitioner

## 2022-11-16 VITALS — BP 138/74 | HR 64 | Ht 65.0 in | Wt 157.6 lb

## 2022-11-16 DIAGNOSIS — E1122 Type 2 diabetes mellitus with diabetic chronic kidney disease: Secondary | ICD-10-CM | POA: Diagnosis not present

## 2022-11-16 DIAGNOSIS — Z794 Long term (current) use of insulin: Secondary | ICD-10-CM

## 2022-11-16 DIAGNOSIS — N1831 Chronic kidney disease, stage 3a: Secondary | ICD-10-CM

## 2022-11-16 MED ORDER — INSULIN DETEMIR 100 UNIT/ML ~~LOC~~ SOLN: 18.0000 [IU] | Freq: Every day | SUBCUTANEOUS | 11 refills | Status: AC

## 2022-11-16 NOTE — Progress Notes (Signed)
Endocrinology Follow Up Note       11/16/2022, 11:05 AM   Subjective:    Patient ID: Angelica Wood, female    DOB: [birth year].  Angelica Wood is being seen in follow up after being seen in consultation for management of currently uncontrolled symptomatic diabetes requested by  Margy Clarks, NP.   Past Medical History:  Diagnosis Date   Anemia    hx of   Anxiety    Arthritis    Bronchitis    hx of   Chronic fatigue    Diabetes mellitus    Diabetes mellitus type I (HCC)    Fibromyalgia    GERD (gastroesophageal reflux disease)    Hypertension    [name]   Hypothyroidism    Irritable bowel syndrome (IBS)    hx of   Kidney disease    Migraine    hx of   Osteoporosis    Pneumonia    hx of   PONV (postoperative nausea and vomiting)    Shortness of breath    with exertion   Sleep apnea    Pt denies   Thyroid disease    Urgency of urination    Urinary incontinence    Vertigo    hx of    Past Surgical History:  Procedure Laterality Date   ABDOMINAL HYSTERECTOMY     APPENDECTOMY     BIOPSY  09/26/2020   Procedure: BIOPSY;  Surgeon: Harvel Quale, MD;  Location: [facility];  Service: Gastroenterology;;   CHOLECYSTECTOMY     COLONOSCOPY W/ POLYPECTOMY     COLONOSCOPY WITH PROPOFOL N/A 09/26/2020   Procedure: COLONOSCOPY WITH PROPOFOL;  Surgeon: Harvel Quale, MD;  Location: [facility];  Service: Gastroenterology;  Laterality: N/A;  [place] OF UTERUS     EYE SURGERY     right cataract removed, laser surgery for glaucoma   JOINT REPLACEMENT Right    thumb   POLYPECTOMY  09/26/2020   Procedure: POLYPECTOMY;  Surgeon: Harvel Quale, MD;  Location: [facility];  Service: Gastroenterology;;   TONSILLECTOMY     TOTAL HIP ARTHROPLASTY Right 08/01/2019   Procedure: TOTAL HIP ARTHROPLASTY ANTERIOR APPROACH;  Surgeon: Gaynelle Arabian, MD;   Location: [facility];  Service: Orthopedics;  Laterality: Right;  128mn   TOTAL SHOULDER ARTHROPLASTY  06/29/2012   Procedure: TOTAL SHOULDER ARTHROPLASTY;  Surgeon: KMarin Shutter MD;  Location: [facility]  Service: Orthopedics;  Laterality: Left;  left total shoulder arthroplasty   TOTAL SHOULDER ARTHROPLASTY Right 02/08/2013   Procedure: TOTAL SHOULDER ARTHROPLASTY;  Surgeon: KMarin Shutter MD;  [facility]  Service: Orthopedics;  Laterality: Right;    Social History   Socioeconomic History   Marital status: Widowed    Spouse name: Not on file   Number of children: Not on file   Years of education: Not on file   Highest education level: Not on file  Occupational History   Not on file  Tobacco Use   Smoking status: Former    Packs/day: 0.25    Years: 4.00    Total pack years: 1.00    Types: Cigarettes  Quit date: 77    Years since quitting: 53.9   Smokeless tobacco: Never  Vaping Use   Vaping Use: Never used  Substance and Sexual Activity   Alcohol use: No   Drug use: No   Sexual activity: Not on file  Other Topics Concern   Not on file  Social History Narrative   Not on file   Social Determinants of Health   Financial Resource Strain: Not on file  Food Insecurity: Not on file  Transportation Needs: Not on file  Physical Activity: Not on file  Stress: Not on file  Social Connections: Not on file    History reviewed. No pertinent family history.  Outpatient Encounter Medications as of 11/16/2022  Medication Sig   albuterol (PROVENTIL HFA;VENTOLIN HFA) 108 (90 BASE) MCG/ACT inhaler Inhale 2 puffs into the lungs every 6 (six) hours as needed for wheezing.    Biotin 10000 MCG TABS Take 10,000 mcg by mouth daily.   cetirizine (ZYRTEC) 10 MG tablet Take 10 mg by mouth daily.   DULoxetine (CYMBALTA) 60 MG capsule Take 60 mg by mouth 2 (two) times daily.   losartan-hydrochlorothiazide (HYZAAR) 100-12.5 MG per tablet Take 1 tablet by mouth daily.   metoprolol  (LOPRESSOR) 50 MG tablet Take 50 mg by mouth daily.    montelukast (SINGULAIR) 10 MG tablet Take 10 mg by mouth daily.    OVER THE COUNTER MEDICATION daily. Immune + daily wellness   UNITHROID 75 MCG tablet Take 1 tablet by mouth daily.   [DISCONTINUED] insulin detemir (LEVEMIR) 100 UNIT/ML injection Inject 0.25 mLs (25 Units total) into the skin daily. Start on Sunday 08/05/19 at bedtime   insulin detemir (LEVEMIR) 100 UNIT/ML injection Inject 0.18 mLs (18 Units total) into the skin daily. Start on Sunday 08/05/19 at bedtime   [DISCONTINUED] rivaroxaban (XARELTO) 10 MG TABS tablet Take 1 tablet (10 mg total) by mouth daily.   No facility-administered encounter medications on file as of 11/16/2022.    ALLERGIES: Allergies  Allergen Reactions   Gabapentin Nausea And Vomiting and Other (See Comments)    Unable to urinate   Adenosine Other (See Comments)    Unknown rxn    Glucophage [Metformin Hydrochloride] Diarrhea   Meloxicam Nausea Only    Dizziness   Prednisone Nausea And Vomiting   Ace Inhibitors Other (See Comments)    Cough   Clindamycin Hcl Rash    All over torso    Macrodantin [Nitrofurantoin Macrocrystal] Rash    Welts   Sulfa Antibiotics Rash    Welts   Tramadol Rash    VACCINATION STATUS:  There is no immunization history on file for this patient.  Diabetes She presents for her follow-up diabetic visit. She has type 2 diabetes mellitus. Onset time: Diagnosed at approx age of 45. Her disease course has been stable. Hypoglycemia symptoms include sweats and tremors. Associated symptoms include fatigue. Pertinent negatives for diabetes include no polyuria and no weight loss. Pertinent negatives for hypoglycemia complications include no blackouts, no nocturnal hypoglycemia and no required assistance. (Has had several occasions in the past where glucose dropped into the 30s.) Symptoms are stable. Diabetic complications include nephropathy. Risk factors for coronary artery  disease include diabetes mellitus and dyslipidemia. Current diabetic treatment includes insulin injections and oral agent (monotherapy). She is compliant with treatment most of the time. Her weight is fluctuating minimally. She is following a generally healthy (she does not eat red meat) diet. When asked about meal planning, she reported none. She has  not had a previous visit with a dietitian. She participates in exercise intermittently. Her home blood glucose trend is decreasing steadily. Her overall blood glucose range is >200 mg/dl. (She presents today, accompanied by her niece, with her CGM showing at goal fasting and slightly above target postprandial readings.  Her POCT A1c today is 8.2%, increasing from last visit of 7.9%.  Analysis of her CGM shows TIR 36%, TAR 64%, TBR 0% with a GMI of 8.3%.  She has been more stressed lately, has lost her sister since last visit and is grieving the loss.  She also notes her son has cancer which is inoperable.  She continues to eat a generally healthy diet and stays as active as she can.) An ACE inhibitor/angiotensin II receptor blocker is being taken. She does not see a podiatrist.Eye exam is current.     Review of systems  Constitutional: +stable body weight, current Body mass index is 26.23 kg/m., no fatigue, no subjective hyperthermia, no subjective hypothermia Eyes: no blurry vision, no xerophthalmia ENT: no sore throat, no nodules palpated in throat, no dysphagia/odynophagia, no hoarseness Cardiovascular: no chest pain, no shortness of breath, no palpitations, no leg swelling Respiratory: no cough, no shortness of breath Gastrointestinal: no nausea/vomiting/diarrhea Musculoskeletal: no muscle/joint aches Skin: no rashes, no hyperemia Neurological: no tremors, no numbness, no tingling, no dizziness Psychiatric: no depression, no anxiety, + increased stress lately  Objective:     BP 138/74 (BP Location: Left Arm, Patient Position: Sitting, Cuff  Size: Normal)   Pulse 64   Ht '5\' 5"'$  (1.651 m)   Wt 157 lb 9.6 oz (71.5 kg)   BMI 26.23 kg/m   Wt Readings from Last 3 Encounters:  11/16/22 157 lb 9.6 oz (71.5 kg)  10/14/22 158 lb 12.8 oz (72 kg)  03/20/21 178 lb (80.7 kg)     BP Readings from Last 3 Encounters:  11/16/22 138/74  10/14/22 138/75  03/20/21 116/86      Physical Exam- Limited  Constitutional:  Body mass index is 26.23 kg/m. , not in acute distress, depressed state of mind, tearful Eyes:  EOMI, no exophthalmos Musculoskeletal: no gross deformities, strength intact in all four extremities, no gross restriction of joint movements Skin:  no rashes, no hyperemia Neurological: no tremor with outstretched hands   Diabetic Foot Exam - Simple   No data filed     CMP ( most recent) CMP     Component Value Date/Time   NA 136 07/31/2020 0653   K 4.5 07/31/2020 0653   CL 101 07/31/2020 0653   CO2 24 07/31/2020 0653   GLUCOSE 262 (H) 07/31/2020 0653   BUN 20 06/30/2022 0000   BUN 20 06/30/2022 0000   CREATININE 1.1 06/30/2022 0000   CREATININE 1.1 06/30/2022 0000   CREATININE 0.88 07/31/2020 0653   CALCIUM 8.9 07/31/2020 0653   PROT 6.6 07/26/2019 1514   ALBUMIN 3.7 07/26/2019 1514   AST 16 07/26/2019 1514   ALT 18 07/26/2019 1514   ALKPHOS 62 07/26/2019 1514   BILITOT 0.4 07/26/2019 1514   GFRNONAA >60 07/31/2020 0653   GFRAA >60 07/31/2020 0653     Diabetic Labs (most recent): Lab Results  Component Value Date   HGBA1C 7.9 06/30/2022   HGBA1C 6.9 (H) 08/02/2019   HGBA1C 6.9 (H) 07/26/2019   MICROALBUR 8 06/30/2022     Lipid Panel ( most recent) Lipid Panel     Component Value Date/Time   TRIG 175 (A) 06/30/2022 0000   TRIG 175 (  A) 06/30/2022 0000   LDLCALC 126 06/30/2022 0000   LDLCALC 126 06/30/2022 0000      Lab Results  Component Value Date   TSH 1.49 06/30/2022           Assessment & Plan:   1) Type 2 diabetes mellitus with stage 3a chronic kidney disease, with  long-term current use of insulin ([place])  She presents today, accompanied by her niece, with her CGM showing at goal fasting and slightly above target postprandial readings.  Her POCT A1c today is 8.2%, increasing from last visit of 7.9%.  Analysis of her CGM shows TIR 36%, TAR 64%, TBR 0% with a GMI of 8.3%.  She has been more stressed lately, has lost her sister since last visit and is grieving the loss.  She also notes her son has cancer which is inoperable.  She continues to eat a generally healthy diet and stays as active as she can.  - Angelica Wood has currently uncontrolled symptomatic type 2 DM since 86 years of age.   -Recent labs reviewed.  - I had a long discussion with her about the progressive nature of diabetes and the pathology behind its complications. -her diabetes is complicated by CKD stage 3 and she remains at a high risk for more acute and chronic complications which include CAD, CVA, CKD, retinopathy, and neuropathy. These are all discussed in detail with her.  The following Lifestyle Medicine recommendations according to [facility]) were discussed and offered to patient and she agrees to start the journey:  A. Whole Foods, Plant-based plate comprising of fruits and vegetables, plant-based proteins, whole-grain carbohydrates was discussed in detail with the patient.   A list for source of those nutrients were also provided to the patient.  Patient will use only water or unsweetened tea for hydration. B.  The need to stay away from risky substances including alcohol, smoking; obtaining 7 to 9 hours of restorative sleep, at least 150 minutes of moderate intensity exercise weekly, the importance of healthy social connections,  and stress reduction techniques were discussed. C.  A full color page of  Calorie density of various food groups per pound showing examples of each food groups was provided to the patient.  - Nutritional counseling repeated  at each appointment due to patients tendency to fall back in to old habits.  - The patient admits there is a room for improvement in their diet and drink choices. -  Suggestion is made for the patient to avoid simple carbohydrates from their diet including Cakes, Sweet Desserts / Pastries, Ice Cream, Soda (diet and regular), Sweet Tea, Candies, Chips, Cookies, Sweet Pastries, Store Bought Juices, Alcohol in Excess of 1-2 drinks a day, Artificial Sweeteners, Coffee Creamer, and "Sugar-free" Products. This will help patient to have stable blood glucose profile and potentially avoid unintended weight gain.   - I encouraged the patient to switch to unprocessed or minimally processed complex starch and increased protein intake (animal or plant source), fruits, and vegetables.   - Patient is advised to stick to a routine mealtimes to eat 3 meals a day and avoid unnecessary snacks (to snack only to correct hypoglycemia).  - I have approached her with the following individualized plan to manage her diabetes and patient agrees:   -She is advised to lower her  Levemir to 18 units SQ daily (she prefers to take it in the morning as she is afraid of night time lows).  She can continue  her Jardiance 10 mg po daily.  I am hoping that this will prevent her from reaching for a glucose tablet which causes spikes right after.  -she is encouraged to continue monitoring glucose 4 times daily (using her CGM), before meals and before bed, and to call the clinic if she has readings less than 70 or above 300 for 3 tests in a row.  - she is warned not to take insulin without proper monitoring per orders. - Adjustment parameters are given to her for hypo and hyperglycemia in writing.  - she is not a candidate for Metformin (nor does she tolerate it) due to concurrent renal insufficiency.  She is also allergic to sulfa meds.  - she is not an ideal candidate for incretin therapy due to body habitus.  - Specific targets  for  A1c; LDL, HDL, and Triglycerides were discussed with the patient.  2) Blood Pressure /Hypertension:  her blood pressure is controlled to target.   she is advised to continue her current medications including Metoprolol 50 mg p.o. daily with breakfast and Losartan-HCTz 100-12.5 mg po daily.  3) Lipids/Hyperlipidemia:    Review of her recent lipid panel from 06/30/22 showed uncontrolled LDL at 126 and elevated triglycerides of 175 . She is not currently on any lipid lowering medications.   4)  Weight/Diet:  her Body mass index is 26.23 kg/m.  -   she is NOT a candidate for weight loss.   Exercise, and detailed carbohydrates information provided  -  detailed on discharge instructions.  5) Chronic Care/Health Maintenance: -she is on ACEI/ARB and not on Statin medications and is encouraged to initiate and continue to follow up with Ophthalmology, Dentist, Podiatrist at least yearly or according to recommendations, and advised to stay away from smoking. I have recommended yearly flu vaccine and pneumonia vaccine at least every 5 years; moderate intensity exercise for up to 150 minutes weekly; and sleep for at least 7 hours a day.  - she is advised to maintain close follow up with Margy Clarks, NP for primary care needs, as well as her other providers for optimal and coordinated care.     I spent 40 minutes in the care of the patient today including review of labs from [place], Lipids, Thyroid Function, Hematology (current and previous including abstractions from other facilities); face-to-face time discussing  her blood glucose readings/logs, discussing hypoglycemia and hyperglycemia episodes and symptoms, medications doses, her options of short and long term treatment based on the latest standards of care / guidelines;  discussion about incorporating lifestyle medicine;  and documenting the encounter. Risk reduction counseling performed per USPSTF guidelines to reduce obesity and cardiovascular risk  factors.     Please refer to Patient Instructions for Blood Glucose Monitoring and Insulin/Medications Dosing Guide"  in media tab for additional information. Please  also refer to " Patient Self Inventory" in the Media  tab for reviewed elements of pertinent patient history.  Angelica Wood participated in the discussions, expressed understanding, and voiced agreement with the above plans.  All questions were answered to her satisfaction. she is encouraged to contact clinic should she have any questions or concerns prior to her return visit.     Follow up plan: - Return in about 3 months (around 02/15/2023) for Diabetes F/U with A1c in office, No previsit labs, Bring meter and logs.   Angelica Wood, [facility] [address] [place], [place] [address] Phone: [phone] Fax: [phone]  11/16/2022, 11:05 AM

## 2022-11-17 ENCOUNTER — Ambulatory Visit: Payer: BC Managed Care – PPO | Admitting: Nurse Practitioner

## 2023-02-23 ENCOUNTER — Ambulatory Visit: Payer: Medicare PPO | Admitting: Nurse Practitioner

## 2023-03-23 NOTE — Patient Instructions (Signed)

## 2023-03-25 ENCOUNTER — Encounter: Payer: Self-pay | Admitting: Nurse Practitioner

## 2023-03-25 ENCOUNTER — Ambulatory Visit (INDEPENDENT_AMBULATORY_CARE_PROVIDER_SITE_OTHER): Payer: Medicare HMO | Admitting: Nurse Practitioner

## 2023-03-25 VITALS — BP 138/74 | HR 56 | Ht 65.0 in | Wt 150.0 lb

## 2023-03-25 DIAGNOSIS — E1122 Type 2 diabetes mellitus with diabetic chronic kidney disease: Secondary | ICD-10-CM

## 2023-03-25 DIAGNOSIS — Z794 Long term (current) use of insulin: Secondary | ICD-10-CM | POA: Diagnosis not present

## 2023-03-25 DIAGNOSIS — N1831 Chronic kidney disease, stage 3a: Secondary | ICD-10-CM

## 2023-03-25 LAB — POCT GLYCOSYLATED HEMOGLOBIN (HGB A1C): Hemoglobin A1C: 9.9 % — AB (ref 4.0–5.6)

## 2023-03-25 NOTE — Progress Notes (Signed)
Endocrinology Follow Up Note       03/25/2023, 11:55 AM   Subjective:    Patient ID: Angelica Wood, female    DOB: 11-Nov-1936.  Angelica Wood is being seen in follow up after being seen in consultation for management of currently uncontrolled symptomatic diabetes requested by  Melvyn Neth, NP.   Past Medical History:  Diagnosis Date   Anemia    hx of   Anxiety    Arthritis    Bronchitis    hx of   Chronic fatigue    Diabetes mellitus    Diabetes mellitus type I    Fibromyalgia    GERD (gastroesophageal reflux disease)    Hypertension    [name]   Hypothyroidism    Irritable bowel syndrome (IBS)    hx of   Kidney disease    Migraine    hx of   Osteoporosis    Pneumonia    hx of   PONV (postoperative nausea and vomiting)    Shortness of breath    with exertion   Sleep apnea    Pt denies   Thyroid disease    Urgency of urination    Urinary incontinence    Vertigo    hx of    Past Surgical History:  Procedure Laterality Date   ABDOMINAL HYSTERECTOMY     APPENDECTOMY     BIOPSY  09/26/2020   Procedure: BIOPSY;  Surgeon: Dolores Frame, MD;  Location: [facility];  Service: Gastroenterology;;   CHOLECYSTECTOMY     COLONOSCOPY W/ POLYPECTOMY     COLONOSCOPY WITH PROPOFOL N/A 09/26/2020   Procedure: COLONOSCOPY WITH PROPOFOL;  Surgeon: Dolores Frame, MD;  Location: [facility];  Service: Gastroenterology;  Laterality: N/A;  215   DILATION AND CURETTAGE OF UTERUS     EYE SURGERY     right cataract removed, laser surgery for glaucoma   JOINT REPLACEMENT Right    thumb   POLYPECTOMY  09/26/2020   Procedure: POLYPECTOMY;  Surgeon: Dolores Frame, MD;  Location: [facility];  Service: Gastroenterology;;   TONSILLECTOMY     TOTAL HIP ARTHROPLASTY Right 08/01/2019   Procedure: TOTAL HIP ARTHROPLASTY ANTERIOR APPROACH;  Surgeon: Ollen Gross, MD;   Location: [facility];  Service: Orthopedics;  Laterality: Right;    TOTAL SHOULDER ARTHROPLASTY  06/29/2012   Procedure: TOTAL SHOULDER ARTHROPLASTY;  Surgeon: Senaida Lange, MD;  Location: [facility];  Service: Orthopedics;  Laterality: Left;  left total shoulder arthroplasty   TOTAL SHOULDER ARTHROPLASTY Right 02/08/2013   Procedure: TOTAL SHOULDER ARTHROPLASTY;  Surgeon: Senaida Lange, MD;  Location: [facility];  Service: Orthopedics;  Laterality: Right;    Social History   Socioeconomic History   Marital status: Widowed    Spouse name: Not on file   Number of children: Not on file   Years of education: Not on file   Highest education level: Not on file  Occupational History   Not on file  Tobacco Use   Smoking status: Former    Packs/day: 0.25    Years: 4.00    Additional pack years: 0.00    Total pack years: 1.00  Types: Cigarettes    Quit date: 1970    Years since quitting: 54.3   Smokeless tobacco: Never  Vaping Use   Vaping Use: Never used  Substance and Sexual Activity   Alcohol use: No   Drug use: No   Sexual activity: Not on file  Other Topics Concern   Not on file  Social History Narrative   Not on file   Social Determinants of Health   Financial Resource Strain: Not on file  Food Insecurity: Not on file  Transportation Needs: Not on file  Physical Activity: Not on file  Stress: Not on file  Social Connections: Not on file    History reviewed. No pertinent family history.  Outpatient Encounter Medications as of 03/25/2023  Medication Sig   albuterol (PROVENTIL HFA;VENTOLIN HFA) 108 (90 BASE) MCG/ACT inhaler Inhale 2 puffs into the lungs every 6 (six) hours as needed for wheezing.    Biotin 27035 MCG TABS Take 10,000 mcg by mouth daily.   cetirizine (ZYRTEC) 10 MG tablet Take 10 mg by mouth daily.   DULoxetine (CYMBALTA) 60 MG capsule Take 60 mg by mouth 2 (two) times daily.   insulin detemir (LEVEMIR) 100 UNIT/ML injection Inject 0.18 mLs (18 Units total)  into the skin daily. Start on Sunday 08/05/19 at bedtime   losartan-hydrochlorothiazide (HYZAAR) 100-12.5 MG per tablet Take 1 tablet by mouth daily.   metoprolol (LOPRESSOR) 50 MG tablet Take 50 mg by mouth daily.    montelukast (SINGULAIR) 10 MG tablet Take 10 mg by mouth daily.    OVER THE COUNTER MEDICATION daily. Immune + daily wellness   UNITHROID 75 MCG tablet Take 1 tablet by mouth daily.   [DISCONTINUED] rivaroxaban (XARELTO) 10 MG TABS tablet Take 1 tablet (10 mg total) by mouth daily.   No facility-administered encounter medications on file as of 03/25/2023.    ALLERGIES: Allergies  Allergen Reactions   Gabapentin Nausea And Vomiting and Other (See Comments)    Unable to urinate   Adenosine Other (See Comments)    Unknown rxn    Glucophage [Metformin Hydrochloride] Diarrhea   Meloxicam Nausea Only    Dizziness   Prednisone Nausea And Vomiting   Ace Inhibitors Other (See Comments)    Cough   Clindamycin Hcl Rash    All over torso    Macrodantin [Nitrofurantoin Macrocrystal] Rash    Welts   Sulfa Antibiotics Rash    Welts   Tramadol Rash    VACCINATION STATUS:  There is no immunization history on file for this patient.  Diabetes She presents for her follow-up diabetic visit. She has type 2 diabetes mellitus. Onset time: Diagnosed at approx age of 58. Her disease course has been worsening. There are no hypoglycemic associated symptoms. Associated symptoms include fatigue and weight loss. Pertinent negatives for diabetes include no polyuria. Pertinent negatives for hypoglycemia complications include no blackouts, no nocturnal hypoglycemia and no required assistance. (Has had several occasions in the past where glucose dropped into the 30s.) Symptoms are stable. Diabetic complications include nephropathy. Risk factors for coronary artery disease include diabetes mellitus and dyslipidemia. Current diabetic treatment includes insulin injections and oral agent (monotherapy).  She is compliant with treatment most of the time. Her weight is fluctuating minimally. She is following a generally healthy (she does not eat red meat) diet. When asked about meal planning, she reported none. She has not had a previous visit with a dietitian. She participates in exercise intermittently. Her home blood glucose trend is decreasing steadily.  Her overall blood glucose range is >200 mg/dl. (She presents today, accompanied by her niece, with her CGM showing above target glycemic profile overall.  Her POCT A1c today is 9.9%, increasing from last visit of 8.2%.  She denies any hypoglycemia but does note she continues to eat glucose tablets for fear of hypoglycemia if glucose is less than 160.  Analysis of her CGM shows TIR 15%, TAR 85%, TBR 0% with a GMI of 9.6%.) An ACE inhibitor/angiotensin II receptor blocker is being taken. She does not see a podiatrist.Eye exam is current.     Review of systems  Constitutional: +steadily decreasing body weight, current Body mass index is 24.96 kg/m., no fatigue, no subjective hyperthermia, no subjective hypothermia Eyes: no blurry vision, no xerophthalmia ENT: no sore throat, no nodules palpated in throat, no dysphagia/odynophagia, no hoarseness Cardiovascular: no chest pain, no shortness of breath, no palpitations, no leg swelling Respiratory: no cough, no shortness of breath Gastrointestinal: no nausea/vomiting/diarrhea Musculoskeletal: no muscle/joint aches Skin: no rashes, no hyperemia Neurological: no tremors, no numbness, no tingling, no dizziness Psychiatric: no depression, no anxiety, + increased stress lately-continued  Objective:     BP 138/74 (BP Location: Right Arm, Patient Position: Sitting, Cuff Size: Normal) Comment: Retake with manuel cuff  Pulse (!) 56   Ht  (1.651 m)   Wt 150 lb (68 kg)   BMI 24.96 kg/m   Wt Readings from Last 3 Encounters:  03/25/23 150 lb (68 kg)  11/16/22 157 lb 9.6 oz (71.5 kg)  10/14/22 158 lb  12.8 oz (72 kg)     BP Readings from Last 3 Encounters:  03/25/23 138/74  11/16/22 138/74  10/14/22 138/75      Physical Exam- Limited  Constitutional:  Body mass index is 24.96 kg/m. , not in acute distress, depressed state of mind, tearful Eyes:  EOMI, no exophthalmos Musculoskeletal: no gross deformities, strength intact in all four extremities, no gross restriction of joint movements Skin:  no rashes, no hyperemia Neurological: no tremor with outstretched hands   Diabetic Foot Exam - Simple   No data filed     CMP ( most recent) CMP     Component Value Date/Time   NA 136 07/31/2020 0653   K 4.5 07/31/2020 0653   CL 101 07/31/2020 0653   CO2 24 07/31/2020 0653   GLUCOSE 262 (H) 07/31/2020 0653   BUN 20 06/30/2022 0000   BUN 20 06/30/2022 0000   CREATININE 1.1 06/30/2022 0000   CREATININE 1.1 06/30/2022 0000   CREATININE 0.88 07/31/2020 0653   CALCIUM 8.9 07/31/2020 0653   PROT 6.6 07/26/2019 1514   ALBUMIN 3.7 07/26/2019 1514   AST 16 07/26/2019 1514   ALT 18 07/26/2019 1514   ALKPHOS 62 07/26/2019 1514   BILITOT 0.4 07/26/2019 1514   GFRNONAA >60 07/31/2020 0653   GFRAA >60 07/31/2020 0653     Diabetic Labs (most recent): Lab Results  Component Value Date   HGBA1C 9.9 (A) 03/25/2023   HGBA1C 7.9 06/30/2022   HGBA1C 6.9 (H) 08/02/2019   MICROALBUR 8 06/30/2022     Lipid Panel ( most recent) Lipid Panel     Component Value Date/Time   TRIG 175 (A) 06/30/2022 0000   TRIG 175 (A) 06/30/2022 0000   LDLCALC 126 06/30/2022 0000   LDLCALC 126 06/30/2022 0000      Lab Results  Component Value Date   TSH 1.49 06/30/2022           Assessment &  Plan:   1) Type 2 diabetes mellitus with stage 3a chronic kidney disease, with long-term current use of insulin (HCC)  She presents today, accompanied by her niece, with her CGM showing above target glycemic profile overall.  Her POCT A1c today is 9.9%, increasing from last visit of 8.2%.  She denies  any hypoglycemia but does note she continues to eat glucose tablets for fear of hypoglycemia if glucose is less than 160.  Analysis of her CGM shows TIR 15%, TAR 85%, TBR 0% with a GMI of 9.6%.  - SHAREEKA YIM has currently uncontrolled symptomatic type 2 DM since 87 years of age.   -Recent labs reviewed.  - I had a long discussion with her about the progressive nature of diabetes and the pathology behind its complications. -her diabetes is complicated by CKD stage 3 and she remains at a high risk for more acute and chronic complications which include CAD, CVA, CKD, retinopathy, and neuropathy. These are all discussed in detail with her.  The following Lifestyle Medicine recommendations according to American College of Lifestyle Medicine [facility]) were discussed and offered to patient and she agrees to start the journey:  A. Whole Foods, Plant-based plate comprising of fruits and vegetables, plant-based proteins, whole-grain carbohydrates was discussed in detail with the patient.   A list for source of those nutrients were also provided to the patient.  Patient will use only water or unsweetened tea for hydration. B.  The need to stay away from risky substances including alcohol, smoking; obtaining 7 to 9 hours of restorative sleep, at least 150 minutes of moderate intensity exercise weekly, the importance of healthy social connections,  and stress reduction techniques were discussed. C.  A full color page of  Calorie density of various food groups per pound showing examples of each food groups was provided to the patient.  - Nutritional counseling repeated at each appointment due to patients tendency to fall back in to old habits.  - The patient admits there is a room for improvement in their diet and drink choices. -  Suggestion is made for the patient to avoid simple carbohydrates from their diet including Cakes, Sweet Desserts / Pastries, Ice Cream, Soda (diet and regular), Sweet Tea, Candies,  Chips, Cookies, Sweet Pastries, Store Bought Juices, Alcohol in Excess of 1-2 drinks a day, Artificial Sweeteners, Coffee Creamer, and "Sugar-free" Products. This will help patient to have stable blood glucose profile and potentially avoid unintended weight gain.   - I encouraged the patient to switch to unprocessed or minimally processed complex starch and increased protein intake (animal or plant source), fruits, and vegetables.   - Patient is advised to stick to a routine mealtimes to eat 3 meals a day and avoid unnecessary snacks (to snack only to correct hypoglycemia).  - I have approached her with the following individualized plan to manage her diabetes and patient agrees:   -She is advised to continue her  Levemir 18 units SQ daily (she prefers to take it in the morning as she is afraid of night time lows) but to be more consistent taking it (says she has forgotten several times).  She can continue her Jardiance 10 mg po daily.   -she is encouraged to continue monitoring glucose 4 times daily (using her CGM), before meals and before bed, and to call the clinic if she has readings less than 70 or above 300 for 3 tests in a row.  - she is warned not to take insulin without  proper monitoring per orders. - Adjustment parameters are given to her for hypo and hyperglycemia in writing.  - she is not a candidate for Metformin (nor does she tolerate it) due to concurrent renal insufficiency.  She is also allergic to sulfa meds.  - she is not an ideal candidate for incretin therapy due to body habitus.  - Specific targets for  A1c; LDL, HDL, and Triglycerides were discussed with the patient.  2) Blood Pressure /Hypertension:  her blood pressure is controlled to target.   she is advised to continue her current medications including Metoprolol 50 mg p.o. daily with breakfast and Losartan-HCTz 100-12.5 mg po daily.  3) Lipids/Hyperlipidemia:    Review of her recent lipid panel from 06/30/22  showed uncontrolled LDL at 126 and elevated triglycerides of 175 . She is not currently on any lipid lowering medications.   4)  Weight/Diet:  her Body mass index is 24.96 kg/m.  -   she is NOT a candidate for weight loss.   Exercise, and detailed carbohydrates information provided  -  detailed on discharge instructions.  5) Chronic Care/Health Maintenance: -she is on ACEI/ARB and not on Statin medications and is encouraged to initiate and continue to follow up with Ophthalmology, Dentist, Podiatrist at least yearly or according to recommendations, and advised to stay away from smoking. I have recommended yearly flu vaccine and pneumonia vaccine at least every 5 years; moderate intensity exercise for up to 150 minutes weekly; and sleep for at least 7 hours a day.  - she is advised to maintain close follow up with Melvyn Neth, NP for primary care needs, as well as her other providers for optimal and coordinated care.      I spent  29  minutes in the care of the patient today including review of labs from CMP, Lipids, Thyroid Function, Hematology (current and previous including abstractions from other facilities); face-to-face time discussing  her blood glucose readings/logs, discussing hypoglycemia and hyperglycemia episodes and symptoms, medications doses, her options of short and long term treatment based on the latest standards of care / guidelines;  discussion about incorporating lifestyle medicine;  and documenting the encounter. Risk reduction counseling performed per USPSTF guidelines to reduce obesity and cardiovascular risk factors.     Please refer to Patient Instructions for Blood Glucose Monitoring and Insulin/Medications Dosing Guide"  in media tab for additional information. Please  also refer to " Patient Self Inventory" in the Media  tab for reviewed elements of pertinent patient history.  Elvera Lennox participated in the discussions, expressed understanding, and voiced agreement  with the above plans.  All questions were answered to her satisfaction. she is encouraged to contact clinic should she have any questions or concerns prior to her return visit.     Follow up plan: - Return in about 3 months (around 06/24/2023) for Diabetes F/U with A1c in office, No previsit labs, Bring meter and logs.   Ronny Bacon, [facility] [address] [place], [place] [address] Phone: [phone] Fax: [phone]  03/25/2023, 11:55 AM

## 2023-06-14 ENCOUNTER — Telehealth: Payer: Self-pay | Admitting: "Endocrinology

## 2023-06-14 NOTE — Telephone Encounter (Signed)
Left a message requesting a return call to the office. 

## 2023-06-14 NOTE — Telephone Encounter (Signed)
Pt's niece called and said that currently her sugar is 467. I asked her to please call and get her last 3 readings

## 2023-06-15 NOTE — Telephone Encounter (Signed)
Spoke with pt's niece, she stated pt had eaten things yesterday that contributed to her elevated BG reading but since then her BG had come back down. She was not able to give me pt's BG reading for this morning.

## 2023-07-08 ENCOUNTER — Ambulatory Visit (INDEPENDENT_AMBULATORY_CARE_PROVIDER_SITE_OTHER): Payer: Medicare HMO | Admitting: Nurse Practitioner

## 2023-07-08 ENCOUNTER — Encounter: Payer: Self-pay | Admitting: Nurse Practitioner

## 2023-07-08 VITALS — BP 121/67 | HR 64 | Ht 65.0 in | Wt 148.6 lb

## 2023-07-08 DIAGNOSIS — N1831 Chronic kidney disease, stage 3a: Secondary | ICD-10-CM | POA: Diagnosis not present

## 2023-07-08 DIAGNOSIS — E1122 Type 2 diabetes mellitus with diabetic chronic kidney disease: Secondary | ICD-10-CM

## 2023-07-08 DIAGNOSIS — Z7984 Long term (current) use of oral hypoglycemic drugs: Secondary | ICD-10-CM | POA: Diagnosis not present

## 2023-07-08 DIAGNOSIS — Z794 Long term (current) use of insulin: Secondary | ICD-10-CM | POA: Diagnosis not present

## 2023-07-08 LAB — POCT GLYCOSYLATED HEMOGLOBIN (HGB A1C): Hemoglobin A1C: 10.6 % — AB (ref 4.0–5.6)

## 2023-07-08 NOTE — Progress Notes (Signed)
Endocrinology Follow Up Note       07/08/2023, 11:26 AM   Subjective:    Patient ID: Angelica Wood, female    DOB: 1936-07-20.  Angelica Wood is being seen in follow up after being seen in consultation for management of currently uncontrolled symptomatic diabetes requested by  Melvyn Neth, NP.   Past Medical History:  Diagnosis Date   Anemia    hx of   Anxiety    Arthritis    Bronchitis    hx of   Chronic fatigue    Diabetes mellitus    Diabetes mellitus type I (HCC)    Fibromyalgia    GERD (gastroesophageal reflux disease)    Hypertension    [name]   Hypothyroidism    Irritable bowel syndrome (IBS)    hx of   Kidney disease    Migraine    hx of   Osteoporosis    Pneumonia    hx of   PONV (postoperative nausea and vomiting)    Shortness of breath    with exertion   Sleep apnea    Pt denies   Thyroid disease    Urgency of urination    Urinary incontinence    Vertigo    hx of    Past Surgical History:  Procedure Laterality Date   ABDOMINAL HYSTERECTOMY     APPENDECTOMY     BIOPSY  09/26/2020   Procedure: BIOPSY;  Surgeon: Dolores Frame, MD;  Location: [facility];  Service: Gastroenterology;;   CHOLECYSTECTOMY     COLONOSCOPY W/ POLYPECTOMY     COLONOSCOPY WITH PROPOFOL N/A 09/26/2020   Procedure: COLONOSCOPY WITH PROPOFOL;  Surgeon: Dolores Frame, MD;  Location: [facility];  Service: Gastroenterology;  Laterality: N/A;  215   DILATION AND CURETTAGE OF UTERUS     EYE SURGERY     right cataract removed, laser surgery for glaucoma   JOINT REPLACEMENT Right    thumb   POLYPECTOMY  09/26/2020   Procedure: POLYPECTOMY;  Surgeon: Dolores Frame, MD;  Location: [facility];  Service: Gastroenterology;;   TONSILLECTOMY     TOTAL HIP ARTHROPLASTY Right 08/01/2019   Procedure: TOTAL HIP ARTHROPLASTY ANTERIOR APPROACH;  Surgeon: Ollen Gross, MD;   Location: [facility];  Service: Orthopedics;  Laterality: Right;    TOTAL SHOULDER ARTHROPLASTY  06/29/2012   Procedure: TOTAL SHOULDER ARTHROPLASTY;  Surgeon: Senaida Lange, MD;  Location: [facility];  Service: Orthopedics;  Laterality: Left;  left total shoulder arthroplasty   TOTAL SHOULDER ARTHROPLASTY Right 02/08/2013   Procedure: TOTAL SHOULDER ARTHROPLASTY;  Surgeon: Senaida Lange, MD;  Location: [facility];  Service: Orthopedics;  Laterality: Right;    Social History   Socioeconomic History   Marital status: Widowed    Spouse name: Not on file   Number of children: Not on file   Years of education: Not on file   Highest education level: Not on file  Occupational History   Not on file  Tobacco Use   Smoking status: Former    Current packs/day: 0.00    Average packs/day: 0.3 packs/day for 4.0 years (1.0 ttl pk-yrs)    Types:  Cigarettes    Start date: 41    Quit date: 37    Years since quitting: 54.6   Smokeless tobacco: Never  Vaping Use   Vaping status: Never Used  Substance and Sexual Activity   Alcohol use: No   Drug use: No   Sexual activity: Not on file  Other Topics Concern   Not on file  Social History Narrative   Not on file   Social Determinants of Health   Financial Resource Strain: Not on file  Food Insecurity: Not on file  Transportation Needs: Not on file  Physical Activity: Not on file  Stress: Not on file  Social Connections: Not on file    History reviewed. No pertinent family history.  Outpatient Encounter Medications as of 07/08/2023  Medication Sig   albuterol (PROVENTIL HFA;VENTOLIN HFA) 108 (90 BASE) MCG/ACT inhaler Inhale 2 puffs into the lungs every 6 (six) hours as needed for wheezing.    Biotin 16109 MCG TABS Take 10,000 mcg by mouth daily.   cetirizine (ZYRTEC) 10 MG tablet Take 10 mg by mouth daily.   DULoxetine (CYMBALTA) 60 MG capsule Take 60 mg by mouth 2 (two) times daily.   insulin detemir (LEVEMIR) 100 UNIT/ML injection Inject  0.18 mLs (18 Units total) into the skin daily. Start on Sunday 08/05/19 at bedtime   losartan-hydrochlorothiazide (HYZAAR) 100-12.5 MG per tablet Take 1 tablet by mouth daily.   metoprolol (LOPRESSOR) 50 MG tablet Take 50 mg by mouth daily.    montelukast (SINGULAIR) 10 MG tablet Take 10 mg by mouth daily.    OVER THE COUNTER MEDICATION daily. Immune + daily wellness   UNITHROID 75 MCG tablet Take 1 tablet by mouth daily.   [DISCONTINUED] rivaroxaban (XARELTO) 10 MG TABS tablet Take 1 tablet (10 mg total) by mouth daily.   No facility-administered encounter medications on file as of 07/08/2023.    ALLERGIES: Allergies  Allergen Reactions   Gabapentin Nausea And Vomiting and Other (See Comments)    Unable to urinate   Adenosine Other (See Comments)    Unknown rxn    Glucophage [Metformin Hydrochloride] Diarrhea   Meloxicam Nausea Only    Dizziness   Prednisone Nausea And Vomiting   Ace Inhibitors Other (See Comments)    Cough   Clindamycin Hcl Rash    All over torso    Macrodantin [Nitrofurantoin Macrocrystal] Rash    Welts   Sulfa Antibiotics Rash    Welts   Tramadol Rash    VACCINATION STATUS:  There is no immunization history on file for this patient.  Diabetes She presents for her follow-up diabetic visit. She has type 2 diabetes mellitus. Onset time: Diagnosed at approx age of 18. Her disease course has been worsening. There are no hypoglycemic associated symptoms. Associated symptoms include fatigue and weight loss. Pertinent negatives for diabetes include no polyuria. Pertinent negatives for hypoglycemia complications include no blackouts, no nocturnal hypoglycemia and no required assistance. (Has had several occasions in the past where glucose dropped into the 30s.) Symptoms are stable. Diabetic complications include nephropathy. Risk factors for coronary artery disease include diabetes mellitus and dyslipidemia. Current diabetic treatment includes insulin injections and  oral agent (monotherapy). She is compliant with treatment most of the time. Her weight is decreasing steadily. She is following a generally healthy (she does not eat red meat) diet. When asked about meal planning, she reported none. She has not had a previous visit with a dietitian. She participates in exercise intermittently. Her home blood  glucose trend is fluctuating dramatically. Her overall blood glucose range is >200 mg/dl. (She presents today, accompanied by her niece, with her CGM showing drastically fluctuating glycemic profile.  Her POCT A1c today is 10.6%, increasing from last visit of 9.9%. She notes trouble getting her CGM for a bit, just put a new sensor on 2 days ago.  She does note some hypoglycemia, especially when she forgets to eat.  Her niece does report some family stressors as well as potential memory issues that may be affecting her ability to manage her glucose effectively.) An ACE inhibitor/angiotensin II receptor blocker is being taken. She does not see a podiatrist.Eye exam is current.     Review of systems  Constitutional: +steadily decreasing body weight, current Body mass index is 24.73 kg/m., no fatigue, no subjective hyperthermia, no subjective hypothermia, ? Memory issues Eyes: no blurry vision, no xerophthalmia ENT: no sore throat, no nodules palpated in throat, no dysphagia/odynophagia, no hoarseness Cardiovascular: no chest pain, no shortness of breath, no palpitations, no leg swelling Respiratory: no cough, no shortness of breath Gastrointestinal: no vomiting/diarrhea, + nausea (started just prior to appt) Musculoskeletal: no muscle/joint aches Skin: no rashes, no hyperemia Neurological: no tremors, no numbness, no tingling, no dizziness Psychiatric: no depression, no anxiety, + increased stress lately-continued  Objective:     BP 121/67 (BP Location: Left Arm, Patient Position: Sitting, Cuff Size: Normal)   Pulse 64   Ht 5\' 5"  (1.651 m)   Wt 148 lb 9.6 oz  (67.4 kg)   BMI 24.73 kg/m   Wt Readings from Last 3 Encounters:  07/08/23 148 lb 9.6 oz (67.4 kg)  03/25/23 150 lb (68 kg)  11/16/22 157 lb 9.6 oz (71.5 kg)     BP Readings from Last 3 Encounters:  07/08/23 121/67  03/25/23 138/74  11/16/22 138/74      Physical Exam- Limited  Constitutional:  Body mass index is 24.73 kg/m. , not in acute distress Eyes:  EOMI, no exophthalmos Musculoskeletal: no gross deformities, strength intact in all four extremities, no gross restriction of joint movements Skin:  no rashes, no hyperemia Neurological: no tremor with outstretched hands   Diabetic Foot Exam - Simple   No data filed     CMP ( most recent) CMP     Component Value Date/Time   NA 136 07/31/2020 0653   K 4.5 07/31/2020 0653   CL 101 07/31/2020 0653   CO2 24 07/31/2020 0653   GLUCOSE 262 (H) 07/31/2020 0653   BUN 20 06/30/2022 0000   BUN 20 06/30/2022 0000   CREATININE 1.1 06/30/2022 0000   CREATININE 1.1 06/30/2022 0000   CREATININE 0.88 07/31/2020 0653   CALCIUM 8.9 07/31/2020 0653   PROT 6.6 07/26/2019 1514   ALBUMIN 3.7 07/26/2019 1514   AST 16 07/26/2019 1514   ALT 18 07/26/2019 1514   ALKPHOS 62 07/26/2019 1514   BILITOT 0.4 07/26/2019 1514   GFRNONAA >60 07/31/2020 0653   GFRAA >60 07/31/2020 0653     Diabetic Labs (most recent): Lab Results  Component Value Date   HGBA1C 10.6 (A) 07/08/2023   HGBA1C 9.9 (A) 03/25/2023   HGBA1C 7.9 06/30/2022   MICROALBUR 8 06/30/2022     Lipid Panel ( most recent) Lipid Panel     Component Value Date/Time   TRIG 175 (A) 06/30/2022 0000   TRIG 175 (A) 06/30/2022 0000   LDLCALC 126 06/30/2022 0000   LDLCALC 126 06/30/2022 0000      Lab Results  Component Value Date   TSH 1.49 06/30/2022           Assessment & Plan:   1) Type 2 diabetes mellitus with stage 3a chronic kidney disease, with long-term current use of insulin (HCC)  She presents today, accompanied by her niece, with her CGM showing  drastically fluctuating glycemic profile.  Her POCT A1c today is 10.6%, increasing from last visit of 9.9%. She notes trouble getting her CGM for a bit, just put a new sensor on 2 days ago.  She does note some hypoglycemia, especially when she forgets to eat.  Her niece does report some family stressors as well as potential memory issues that may be affecting her ability to manage her glucose effectively.  - Angelica Wood has currently uncontrolled symptomatic type 2 DM since 87 years of age.   -Recent labs reviewed.  - I had a long discussion with her about the progressive nature of diabetes and the pathology behind its complications. -her diabetes is complicated by CKD stage 3 and she remains at a high risk for more acute and chronic complications which include CAD, CVA, CKD, retinopathy, and neuropathy. These are all discussed in detail with her.  The following Lifestyle Medicine recommendations according to American College of Lifestyle Medicine [facility]) were discussed and offered to patient and she agrees to start the journey:  A. Whole Foods, Plant-based plate comprising of fruits and vegetables, plant-based proteins, whole-grain carbohydrates was discussed in detail with the patient.   A list for source of those nutrients were also provided to the patient.  Patient will use only water or unsweetened tea for hydration. B.  The need to stay away from risky substances including alcohol, smoking; obtaining 7 to 9 hours of restorative sleep, at least 150 minutes of moderate intensity exercise weekly, the importance of healthy social connections,  and stress reduction techniques were discussed. C.  A full color page of  Calorie density of various food groups per pound showing examples of each food groups was provided to the patient.  - Nutritional counseling repeated at each appointment due to patients tendency to fall back in to old habits.  - The patient admits there is a room for improvement in  their diet and drink choices. -  Suggestion is made for the patient to avoid simple carbohydrates from their diet including Cakes, Sweet Desserts / Pastries, Ice Cream, Soda (diet and regular), Sweet Tea, Candies, Chips, Cookies, Sweet Pastries, Store Bought Juices, Alcohol in Excess of 1-2 drinks a day, Artificial Sweeteners, Coffee Creamer, and "Sugar-free" Products. This will help patient to have stable blood glucose profile and potentially avoid unintended weight gain.   - I encouraged the patient to switch to unprocessed or minimally processed complex starch and increased protein intake (animal or plant source), fruits, and vegetables.   - Patient is advised to stick to a routine mealtimes to eat 3 meals a day and avoid unnecessary snacks (to snack only to correct hypoglycemia).  - I have approached her with the following individualized plan to manage her diabetes and patient agrees:   -She is advised to continue her Levemir 18 units SQ daily (she prefers to take it in the morning as she is afraid of night time lows).  She can continue her Jardiance 10 mg po daily.  She tends to overcorrect mild hypoglycemia, we discussed proper ways to treat hypoglycemia today.  We also talked about the importance of not skipping meals.  Due to lack of recent  readings, I am having her return in 4 weeks with her CGM to assess and adjust meds as needed.  -she is encouraged to continue monitoring glucose 4 times daily (using her CGM), before meals and before bed, and to call the clinic if she has readings less than 70 or above 300 for 3 tests in a row.  - she is warned not to take insulin without proper monitoring per orders. - Adjustment parameters are given to her for hypo and hyperglycemia in writing.  - she is not a candidate for Metformin (nor does she tolerate it) due to concurrent renal insufficiency.  She is also allergic to sulfa meds.  - she is not an ideal candidate for incretin therapy due to body  habitus.  - Specific targets for  A1c; LDL, HDL, and Triglycerides were discussed with the patient.  2) Blood Pressure /Hypertension:  her blood pressure is controlled to target.   she is advised to continue her current medications including Metoprolol 50 mg p.o. daily with breakfast and Losartan-HCTz 100-12.5 mg po daily.  3) Lipids/Hyperlipidemia:    Review of her recent lipid panel from 06/30/22 showed uncontrolled LDL at 126 and elevated triglycerides of 175 . She is not currently on any lipid lowering medications.   4)  Weight/Diet:  her Body mass index is 24.73 kg/m.  -   she is NOT a candidate for weight loss.   Exercise, and detailed carbohydrates information provided  -  detailed on discharge instructions.  5) Chronic Care/Health Maintenance: -she is on ACEI/ARB and not on Statin medications and is encouraged to initiate and continue to follow up with Ophthalmology, Dentist, Podiatrist at least yearly or according to recommendations, and advised to stay away from smoking. I have recommended yearly flu vaccine and pneumonia vaccine at least every 5 years; moderate intensity exercise for up to 150 minutes weekly; and sleep for at least 7 hours a day.  - she is advised to maintain close follow up with Melvyn Neth, NP for primary care needs, as well as her other providers for optimal and coordinated care.      I spent  27  minutes in the care of the patient today including review of labs from CMP, Lipids, Thyroid Function, Hematology (current and previous including abstractions from other facilities); face-to-face time discussing  her blood glucose readings/logs, discussing hypoglycemia and hyperglycemia episodes and symptoms, medications doses, her options of short and long term treatment based on the latest standards of care / guidelines;  discussion about incorporating lifestyle medicine;  and documenting the encounter. Risk reduction counseling performed per USPSTF guidelines to  reduce obesity and cardiovascular risk factors.     Please refer to Patient Instructions for Blood Glucose Monitoring and Insulin/Medications Dosing Guide"  in media tab for additional information. Please  also refer to " Patient Self Inventory" in the Media  tab for reviewed elements of pertinent patient history.  Elvera Lennox participated in the discussions, expressed understanding, and voiced agreement with the above plans.  All questions were answered to her satisfaction. she is encouraged to contact clinic should she have any questions or concerns prior to her return visit.     Follow up plan: - Return in about 4 weeks (around 08/05/2023) for Diabetes F/U with A1c in office, No previsit labs, Bring meter and logs.   Ronny Bacon, [facility] [address] [place], [place] [address] Phone: [phone] Fax: [phone]  07/08/2023, 11:26 AM

## 2023-07-14 DIAGNOSIS — M170 Bilateral primary osteoarthritis of knee: Secondary | ICD-10-CM | POA: Diagnosis not present

## 2023-08-12 ENCOUNTER — Ambulatory Visit: Payer: BC Managed Care – PPO | Admitting: Nurse Practitioner

## 2023-08-12 DIAGNOSIS — E1165 Type 2 diabetes mellitus with hyperglycemia: Secondary | ICD-10-CM | POA: Diagnosis not present

## 2023-08-12 DIAGNOSIS — E039 Hypothyroidism, unspecified: Secondary | ICD-10-CM | POA: Diagnosis not present

## 2023-08-12 DIAGNOSIS — Z1231 Encounter for screening mammogram for malignant neoplasm of breast: Secondary | ICD-10-CM | POA: Diagnosis not present

## 2023-08-12 DIAGNOSIS — I10 Essential (primary) hypertension: Secondary | ICD-10-CM | POA: Diagnosis not present

## 2023-08-12 DIAGNOSIS — Z7984 Long term (current) use of oral hypoglycemic drugs: Secondary | ICD-10-CM

## 2023-08-12 DIAGNOSIS — N1831 Chronic kidney disease, stage 3a: Secondary | ICD-10-CM | POA: Diagnosis not present

## 2023-08-12 DIAGNOSIS — M797 Fibromyalgia: Secondary | ICD-10-CM | POA: Diagnosis not present

## 2023-08-12 DIAGNOSIS — Z794 Long term (current) use of insulin: Secondary | ICD-10-CM

## 2023-08-12 DIAGNOSIS — E7800 Pure hypercholesterolemia, unspecified: Secondary | ICD-10-CM | POA: Diagnosis not present

## 2023-08-16 ENCOUNTER — Telehealth: Payer: Self-pay | Admitting: Nurse Practitioner

## 2023-08-16 NOTE — Telephone Encounter (Signed)
Patient scheduled follow up.

## 2023-09-26 ENCOUNTER — Ambulatory Visit: Payer: BC Managed Care – PPO | Admitting: Nurse Practitioner

## 2023-09-30 ENCOUNTER — Ambulatory Visit: Payer: BC Managed Care – PPO | Admitting: Nurse Practitioner

## 2023-09-30 DIAGNOSIS — Z794 Long term (current) use of insulin: Secondary | ICD-10-CM

## 2023-09-30 DIAGNOSIS — Z7984 Long term (current) use of oral hypoglycemic drugs: Secondary | ICD-10-CM

## 2024-02-19 DIAGNOSIS — R42 Dizziness and giddiness: Secondary | ICD-10-CM | POA: Diagnosis not present

## 2024-02-19 DIAGNOSIS — K6389 Other specified diseases of intestine: Secondary | ICD-10-CM | POA: Diagnosis not present

## 2024-02-19 DIAGNOSIS — D1803 Hemangioma of intra-abdominal structures: Secondary | ICD-10-CM | POA: Diagnosis not present

## 2024-02-19 DIAGNOSIS — E0865 Diabetes mellitus due to underlying condition with hyperglycemia: Secondary | ICD-10-CM | POA: Diagnosis not present

## 2024-02-19 DIAGNOSIS — R161 Splenomegaly, not elsewhere classified: Secondary | ICD-10-CM | POA: Diagnosis not present

## 2024-02-19 DIAGNOSIS — I10 Essential (primary) hypertension: Secondary | ICD-10-CM | POA: Diagnosis not present

## 2024-02-19 DIAGNOSIS — K5150 Left sided colitis without complications: Secondary | ICD-10-CM | POA: Diagnosis not present

## 2024-02-19 DIAGNOSIS — K209 Esophagitis, unspecified without bleeding: Secondary | ICD-10-CM | POA: Diagnosis not present

## 2024-02-19 DIAGNOSIS — Z794 Long term (current) use of insulin: Secondary | ICD-10-CM | POA: Diagnosis not present

## 2024-02-19 DIAGNOSIS — R112 Nausea with vomiting, unspecified: Secondary | ICD-10-CM | POA: Diagnosis not present

## 2024-02-19 DIAGNOSIS — Z882 Allergy status to sulfonamides status: Secondary | ICD-10-CM | POA: Diagnosis not present

## 2024-02-19 DIAGNOSIS — R682 Dry mouth, unspecified: Secondary | ICD-10-CM | POA: Diagnosis not present

## 2024-02-19 DIAGNOSIS — E1165 Type 2 diabetes mellitus with hyperglycemia: Secondary | ICD-10-CM | POA: Diagnosis not present

## 2024-02-19 DIAGNOSIS — K529 Noninfective gastroenteritis and colitis, unspecified: Secondary | ICD-10-CM | POA: Diagnosis not present

## 2024-02-19 DIAGNOSIS — R739 Hyperglycemia, unspecified: Secondary | ICD-10-CM | POA: Diagnosis not present

## 2024-02-19 DIAGNOSIS — D1809 Hemangioma of other sites: Secondary | ICD-10-CM | POA: Diagnosis not present

## 2024-02-19 DIAGNOSIS — Z881 Allergy status to other antibiotic agents status: Secondary | ICD-10-CM | POA: Diagnosis not present

## 2024-02-19 DIAGNOSIS — R5381 Other malaise: Secondary | ICD-10-CM | POA: Diagnosis not present

## 2024-02-19 DIAGNOSIS — Z888 Allergy status to other drugs, medicaments and biological substances status: Secondary | ICD-10-CM | POA: Diagnosis not present

## 2024-03-16 DIAGNOSIS — N1831 Chronic kidney disease, stage 3a: Secondary | ICD-10-CM | POA: Diagnosis not present

## 2024-03-16 DIAGNOSIS — R221 Localized swelling, mass and lump, neck: Secondary | ICD-10-CM | POA: Diagnosis not present

## 2024-03-16 DIAGNOSIS — E039 Hypothyroidism, unspecified: Secondary | ICD-10-CM | POA: Diagnosis not present

## 2024-03-16 DIAGNOSIS — E1165 Type 2 diabetes mellitus with hyperglycemia: Secondary | ICD-10-CM | POA: Diagnosis not present

## 2024-03-16 DIAGNOSIS — K2289 Other specified disease of esophagus: Secondary | ICD-10-CM | POA: Diagnosis not present

## 2024-03-16 DIAGNOSIS — R935 Abnormal findings on diagnostic imaging of other abdominal regions, including retroperitoneum: Secondary | ICD-10-CM | POA: Diagnosis not present

## 2024-03-16 DIAGNOSIS — M797 Fibromyalgia: Secondary | ICD-10-CM | POA: Diagnosis not present

## 2024-03-16 DIAGNOSIS — E7800 Pure hypercholesterolemia, unspecified: Secondary | ICD-10-CM | POA: Diagnosis not present

## 2024-03-16 DIAGNOSIS — I10 Essential (primary) hypertension: Secondary | ICD-10-CM | POA: Diagnosis not present

## 2024-03-28 ENCOUNTER — Encounter (INDEPENDENT_AMBULATORY_CARE_PROVIDER_SITE_OTHER): Payer: Self-pay | Admitting: *Deleted

## 2024-05-04 ENCOUNTER — Ambulatory Visit (INDEPENDENT_AMBULATORY_CARE_PROVIDER_SITE_OTHER): Admitting: Gastroenterology

## 2024-05-04 DIAGNOSIS — R221 Localized swelling, mass and lump, neck: Secondary | ICD-10-CM | POA: Diagnosis not present

## 2024-05-29 ENCOUNTER — Ambulatory Visit (INDEPENDENT_AMBULATORY_CARE_PROVIDER_SITE_OTHER): Admitting: Gastroenterology

## 2024-05-30 DIAGNOSIS — R079 Chest pain, unspecified: Secondary | ICD-10-CM | POA: Diagnosis not present

## 2024-05-30 DIAGNOSIS — E1165 Type 2 diabetes mellitus with hyperglycemia: Secondary | ICD-10-CM | POA: Diagnosis not present

## 2024-05-30 DIAGNOSIS — Z882 Allergy status to sulfonamides status: Secondary | ICD-10-CM | POA: Diagnosis not present

## 2024-05-30 DIAGNOSIS — R519 Headache, unspecified: Secondary | ICD-10-CM | POA: Diagnosis not present

## 2024-05-30 DIAGNOSIS — Z881 Allergy status to other antibiotic agents status: Secondary | ICD-10-CM | POA: Diagnosis not present

## 2024-05-30 DIAGNOSIS — Z794 Long term (current) use of insulin: Secondary | ICD-10-CM | POA: Diagnosis not present

## 2024-05-30 DIAGNOSIS — R109 Unspecified abdominal pain: Secondary | ICD-10-CM | POA: Diagnosis not present

## 2024-05-30 DIAGNOSIS — E119 Type 2 diabetes mellitus without complications: Secondary | ICD-10-CM | POA: Diagnosis not present

## 2024-05-30 DIAGNOSIS — Z888 Allergy status to other drugs, medicaments and biological substances status: Secondary | ICD-10-CM | POA: Diagnosis not present

## 2024-06-07 ENCOUNTER — Encounter (INDEPENDENT_AMBULATORY_CARE_PROVIDER_SITE_OTHER): Payer: Self-pay | Admitting: *Deleted

## 2024-06-15 DIAGNOSIS — R0602 Shortness of breath: Secondary | ICD-10-CM | POA: Diagnosis not present

## 2024-06-15 DIAGNOSIS — D7389 Other diseases of spleen: Secondary | ICD-10-CM | POA: Diagnosis not present

## 2024-06-15 DIAGNOSIS — Z882 Allergy status to sulfonamides status: Secondary | ICD-10-CM | POA: Diagnosis not present

## 2024-06-15 DIAGNOSIS — Z881 Allergy status to other antibiotic agents status: Secondary | ICD-10-CM | POA: Diagnosis not present

## 2024-06-15 DIAGNOSIS — Z87891 Personal history of nicotine dependence: Secondary | ICD-10-CM | POA: Diagnosis not present

## 2024-06-15 DIAGNOSIS — Z794 Long term (current) use of insulin: Secondary | ICD-10-CM | POA: Diagnosis not present

## 2024-06-15 DIAGNOSIS — E119 Type 2 diabetes mellitus without complications: Secondary | ICD-10-CM | POA: Diagnosis not present

## 2024-06-15 DIAGNOSIS — R0600 Dyspnea, unspecified: Secondary | ICD-10-CM | POA: Diagnosis not present

## 2024-06-15 DIAGNOSIS — Z888 Allergy status to other drugs, medicaments and biological substances status: Secondary | ICD-10-CM | POA: Diagnosis not present

## 2024-06-15 DIAGNOSIS — R531 Weakness: Secondary | ICD-10-CM | POA: Diagnosis not present

## 2024-06-15 DIAGNOSIS — Z1152 Encounter for screening for COVID-19: Secondary | ICD-10-CM | POA: Diagnosis not present

## 2024-06-15 DIAGNOSIS — R161 Splenomegaly, not elsewhere classified: Secondary | ICD-10-CM | POA: Diagnosis not present

## 2024-06-15 DIAGNOSIS — N390 Urinary tract infection, site not specified: Secondary | ICD-10-CM | POA: Diagnosis not present

## 2024-06-15 DIAGNOSIS — E1165 Type 2 diabetes mellitus with hyperglycemia: Secondary | ICD-10-CM | POA: Diagnosis not present

## 2024-06-26 ENCOUNTER — Encounter (INDEPENDENT_AMBULATORY_CARE_PROVIDER_SITE_OTHER): Payer: Self-pay | Admitting: Gastroenterology

## 2024-06-26 ENCOUNTER — Ambulatory Visit (INDEPENDENT_AMBULATORY_CARE_PROVIDER_SITE_OTHER): Admitting: Gastroenterology

## 2024-06-26 VITALS — BP 111/64 | HR 72 | Temp 98.1°F | Ht 65.0 in | Wt 137.8 lb

## 2024-06-26 DIAGNOSIS — R197 Diarrhea, unspecified: Secondary | ICD-10-CM | POA: Insufficient documentation

## 2024-06-26 DIAGNOSIS — K769 Liver disease, unspecified: Secondary | ICD-10-CM | POA: Diagnosis not present

## 2024-06-26 DIAGNOSIS — Z8719 Personal history of other diseases of the digestive system: Secondary | ICD-10-CM | POA: Diagnosis not present

## 2024-06-26 DIAGNOSIS — K209 Esophagitis, unspecified without bleeding: Secondary | ICD-10-CM | POA: Insufficient documentation

## 2024-06-26 DIAGNOSIS — R933 Abnormal findings on diagnostic imaging of other parts of digestive tract: Secondary | ICD-10-CM | POA: Diagnosis not present

## 2024-06-26 DIAGNOSIS — K5732 Diverticulitis of large intestine without perforation or abscess without bleeding: Secondary | ICD-10-CM | POA: Insufficient documentation

## 2024-06-26 DIAGNOSIS — R935 Abnormal findings on diagnostic imaging of other abdominal regions, including retroperitoneum: Secondary | ICD-10-CM | POA: Insufficient documentation

## 2024-06-26 MED ORDER — PSYLLIUM 58.6 % PO PACK: 1.0000 | PACK | Freq: Two times a day (BID) | ORAL | 2 refills | Status: AC

## 2024-06-26 NOTE — Patient Instructions (Signed)
 It was very nice to meet you today, as dicussed with will plan for the following :  1) Abdominal xray  2) upper endoscopy and colonoscopy after 6 weeks  3) CREON  Creon must be taken DURING every meal and snack.   Whether its a full meal or a snack, take Creon every time you eat.

## 2024-06-26 NOTE — Progress Notes (Signed)
 Angelica Wood Angelica Wood , M.D. Gastroenterology & Hepatology [facility] Gastroenterology [address] [place], [place] [address] Primary Care Physician: Darcie Lerner, NP [address] [place] [address]  Chief Complaint: Abnormal CT results with esophageal wall thickening, and colonic wall thickening with recent episode of diverticulitis, incontinence and diarrhea  History of Present Illness:  Angelica Wood is a 88 y.o. female diabetes, GERD, CKD who presents for evaluation of Abnormal CT results with esophageal wall thickening, and colonic wall thickening with recent episode of diverticulitis, incontinence and diarrhea  Patient had lower abdominal pain where she went to the ER at [facility] February 19, 2024 where CT was done demonstrating mild thickening of descending and sigmoid colon and distal esophageal wall thickening.  It also demonstrated liver lesion suggestive of right hepatic hemangioma.  At that time patient was sent home with p.o. antibiotics for management of diverticulitis .Patient reports she again had lower abdominal pain 2 weeks ago requiring another visit to the ER  Today knees is in the clinic [facility]) who is also helping patient get history as patient appears to be slow mentation .Patient reports  uncontrolled bowel movement where she would have accidents and liquid stool most of the days.  Occasionally have hard stools.  Will intermittently take Tylenol  and Motrin.  Patient denies any dysphagia or odynophagia   Last ZHI:wnwz Last Colonoscopy:2021  - One 7 mm polyp in the ascending colon, removed with a cold snare. Resected and retrieved. Injected. - One 3 mm polyp in the ascending colon, removed with a cold biopsy forceps. Resected and retrieved. - One 3 mm polyp in the sigmoid colon, removed with a cold snare. Resected and retrieved. - The rest of the examined colon is normal. Biopsied. - Non- bleeding internal hemorrhoids.  A. COLON,  ASCENDING, POLYPECTOMY:  - Tubular adenoma (x2 fragments).  - Sessile serrated polyp without dysplasia.  - No high grade dysplasia or malignancy.   B. COLON, RANDOM, BIOPSY:  - Benign colonic mucosa.  - No active inflammation or evidence of microscopic colitis.  - No dysplasia or malignancy.   FHx: neg for any gastrointestinal/liver disease, no malignancies or Social: neg smoking, alcohol or illicit drug use Surgical: Hysterectomy  Past Medical History: Past Medical History:  Diagnosis Date   Anemia    hx of   Anxiety    Arthritis    Bronchitis    hx of   Chronic fatigue    Diabetes mellitus    Diabetes mellitus type I (HCC)    Fibromyalgia    GERD (gastroesophageal reflux disease)    Hypertension    [name]   Hypothyroidism    Irritable bowel syndrome (IBS)    hx of   Kidney disease    Migraine    hx of   Osteoporosis    Pneumonia    hx of   PONV (postoperative nausea and vomiting)    Shortness of breath    with exertion   Sleep apnea    Pt denies   Thyroid  disease    Urgency of urination    Urinary incontinence    Vertigo    hx of    Past Surgical History: Past Surgical History:  Procedure Laterality Date   ABDOMINAL HYSTERECTOMY     APPENDECTOMY     BIOPSY  09/26/2020   Procedure: BIOPSY;  Surgeon: Eartha Angelia Sieving, MD;  Location: [facility];  Service: Gastroenterology;;   SALVADOR  COLONOSCOPY W/ POLYPECTOMY     COLONOSCOPY WITH PROPOFOL  N/A 09/26/2020   Procedure: COLONOSCOPY WITH PROPOFOL ;  Surgeon: Eartha Angelia Sieving, MD;  Location: [facility];  Service: Gastroenterology;  Laterality: N/A;  215   DILATION AND CURETTAGE OF UTERUS     EYE SURGERY     right cataract removed, laser surgery for glaucoma   JOINT REPLACEMENT Right    thumb   POLYPECTOMY  09/26/2020   Procedure: POLYPECTOMY;  Surgeon: Eartha Angelia Sieving, MD;  Location: [facility];  Service: Gastroenterology;;   TONSILLECTOMY     TOTAL HIP  ARTHROPLASTY Right 08/01/2019   Procedure: TOTAL HIP ARTHROPLASTY ANTERIOR APPROACH;  Surgeon: Melodi Lerner, MD;  Location: [facility];  Service: Orthopedics;  Laterality: Right;    TOTAL SHOULDER ARTHROPLASTY  06/29/2012   Procedure: TOTAL SHOULDER ARTHROPLASTY;  Surgeon: Franky CHRISTELLA Pointer, MD;  Location: [facility];  Service: Orthopedics;  Laterality: Left;  left total shoulder arthroplasty   TOTAL SHOULDER ARTHROPLASTY Right 02/08/2013   Procedure: TOTAL SHOULDER ARTHROPLASTY;  Surgeon: Franky CHRISTELLA Pointer, MD;  Location: [facility];  Service: Orthopedics;  Laterality: Right;    Family History:History reviewed. No pertinent family history.  Social History: Social History   Tobacco Use  Smoking Status Former   Current packs/day: 0.00   Average packs/day: 0.3 packs/day for 4.0 years (1.0 ttl pk-yrs)   Types: Cigarettes   Start date: 1966   Quit date: 1970   Years since quitting: 55.5  Smokeless Tobacco Never   Social History   Substance and Sexual Activity  Alcohol Use No   Social History   Substance and Sexual Activity  Drug Use No    Allergies: Allergies  Allergen Reactions   Gabapentin Nausea And Vomiting and Other (See Comments)    Unable to urinate   Adenosine Other (See Comments)    Unknown rxn    Glucophage [Metformin Hydrochloride] Diarrhea   Meloxicam Nausea Only    Dizziness   Prednisone Nausea And Vomiting   Ace Inhibitors Other (See Comments)    Cough   Clindamycin Hcl Rash    All over torso    Macrodantin [Nitrofurantoin Macrocrystal] Rash    Welts   Sulfa Antibiotics Rash    Welts   Tramadol Rash    Medications: Current Outpatient Medications  Medication Sig Dispense Refill   albuterol  (PROVENTIL  HFA;VENTOLIN  HFA) 108 (90 BASE) MCG/ACT inhaler Inhale 2 puffs into the lungs every 6 (six) hours as needed for wheezing.      cetirizine (ZYRTEC) 10 MG tablet Take 10 mg by mouth daily.     DULoxetine  (CYMBALTA ) 60 MG capsule Take 60 mg by mouth 2 (two) times  daily.     insulin  detemir (LEVEMIR ) 100 UNIT/ML injection Inject 0.18 mLs (18 Units total) into the skin daily. Start on Sunday 08/05/19 at bedtime 10 mL 11   losartan -hydrochlorothiazide  (HYZAAR) 100-12.5 MG per tablet Take 1 tablet by mouth daily.     metoprolol  (LOPRESSOR ) 50 MG tablet Take 50 mg by mouth daily.      montelukast  (SINGULAIR ) 10 MG tablet Take 10 mg by mouth daily.      psyllium (METAMUCIL) 58.6 % packet Take 1 packet by mouth 2 (two) times daily. 60 packet 2   UNITHROID  75 MCG tablet Take 1 tablet by mouth daily.     No current facility-administered medications for this visit.    Review of Systems: GENERAL: negative for malaise, night sweats HEENT: No changes in hearing or vision, no  nose bleeds or other nasal problems. NECK: Negative for lumps, goiter, pain and significant neck swelling RESPIRATORY: Negative for cough, wheezing CARDIOVASCULAR: Negative for chest pain, leg swelling, palpitations, orthopnea GI: SEE HPI MUSCULOSKELETAL: Negative for joint pain or swelling, back pain, and muscle pain. SKIN: Negative for lesions, rash HEMATOLOGY Negative for prolonged bleeding, bruising easily, and swollen nodes. ENDOCRINE: Negative for cold or heat intolerance, polyuria, polydipsia and goiter. NEURO: negative for tremor, gait imbalance, syncope and seizures. The remainder of the review of systems is noncontributory.   Physical Exam: BP 111/64   Pulse 72   Temp 98.1 F (36.7 C)   Ht 5' 5 (1.651 m)   Wt 137 lb 12.8 oz (62.5 kg)   BMI 22.93 kg/m  GENERAL: The patient is AO x3, in no acute distress. HEENT: Head is normocephalic and atraumatic. EOMI are intact. Mouth is well hydrated and without lesions. NECK: Supple. No masses LUNGS: Clear to auscultation. No presence of rhonchi/wheezing/rales. Adequate chest expansion HEART: RRR, normal s1 and s2. ABDOMEN: Soft, nontender, no guarding, no peritoneal signs, and nondistended. BS +. No masses.  Imaging/Labs: as  above     Latest Ref Rng & Units 07/31/2020    6:53 AM 08/04/2019    2:44 AM 08/03/2019    2:47 AM  CBC  WBC 4.0 - 10.5 K/uL 8.4  8.8  9.5   Hemoglobin 12.0 - 15.0 g/dL 87.1  89.0  88.5   Hematocrit 36.0 - 46.0 % 40.4  34.1  35.5   Platelets 150 - 400 K/uL 191  182  181    No results found for: IRON, TIBC, FERRITIN  I personally reviewed and interpreted the available labs, imaging and endoscopic files. CT    Impression and Plan:  MYEISHA KRUSER is a 88 y.o. female diabetes, GERD, CKD who presents for evaluation of Abnormal CT results with esophageal wall thickening, and colonic wall thickening with recent episode of diverticulitis, incontinence and diarrhea  #Abnormal CT: esophageal wall thickening #History of diverticulitis /colitis   Patient appears to have 2 episodes of acute abdominal pain and atleast 1 CT demonstrating possible finding of colitis.  This could be diverticulitis unlikely malignancy as patient had a good-quality colonoscopy in 2021  Although since it has been 4 years and patient had 2 episodes of colitis with continued diarrhea  I discussed possibility of ruling out inflammatory bowel disease with colonoscopy.  Esophageal wall thickening could be esophagitis but given advanced age malignancy remains a possibility  I thoroughly discussed with the patient and POA (Tammy)  the procedure, including the risks involved. Patient and POA (Tammy) understands what the procedure involves including the benefits and any risks. Patient and POA (Tammy) understands alternatives to the proposed procedure. Risks including (but not limited to) bleeding, tearing of the lining (perforation), rupture of adjacent organs, problems with heart and lung function, infection, and medication reactions. A small percentage of complications may require surgery, hospitalization, repeat endoscopic procedure, and/or transfusion.  Patient and POA (Tammy)  understood and agreed.   They would also  verbalize given patient advanced age high risk of intraprocedural adverse effect from anesthesia higher than usual population  #Diarrhea  Previous colonoscopy patient had random colon biopsies negative for microscopic colitis. Given uncontrolled diabetes there could be a component of exocrine pancreatic insufficiency or this may be overflow diarrhea  Will obtain abdominal x-ray to evaluate stool burden today if large amount is found we will give bowel purge Regardless we will give bulking agent with  psyllium -Metamucil Will give trial of Creon samples given if improvement will prescribe the patient for possible exocrine pancreatic insufficiency  #Liver lesion  CT demonstrated possible liver hemangioma but radiologist suggested MRI for follow-up  Will pursue MRI in future after above workup   All questions were answered.      Aveer Bartow Angelica Hamish Banks, MD Gastroenterology and Hepatology [facility] Gastroenterology   This chart has been completed using [facility] Dictation software, and while attempts have been made to ensure accuracy , certain words and phrases may not be transcribed as intended

## 2024-06-27 ENCOUNTER — Telehealth: Payer: Self-pay | Admitting: *Deleted

## 2024-06-27 NOTE — Telephone Encounter (Signed)
 Spoke with pt's niece Monterio Bob (on dpr) about scheduling procedures in 6 weeks. She said pt said she is going to have to think about scheduling the procedures. She says pt is concerned about being put under anesthesia and her age. Carlon Davidson says she will call back if her aunt decides she wants to pursue the procedures.

## 2024-09-02 DIAGNOSIS — I10 Essential (primary) hypertension: Secondary | ICD-10-CM | POA: Diagnosis not present

## 2024-09-02 DIAGNOSIS — S0033XA Contusion of nose, initial encounter: Secondary | ICD-10-CM | POA: Diagnosis not present

## 2024-09-02 DIAGNOSIS — S022XXA Fracture of nasal bones, initial encounter for closed fracture: Secondary | ICD-10-CM | POA: Diagnosis not present

## 2024-09-02 DIAGNOSIS — R739 Hyperglycemia, unspecified: Secondary | ICD-10-CM | POA: Diagnosis not present

## 2024-09-02 DIAGNOSIS — Z794 Long term (current) use of insulin: Secondary | ICD-10-CM | POA: Diagnosis not present

## 2024-09-02 DIAGNOSIS — W19XXXA Unspecified fall, initial encounter: Secondary | ICD-10-CM | POA: Diagnosis not present

## 2024-09-02 DIAGNOSIS — S0003XA Contusion of scalp, initial encounter: Secondary | ICD-10-CM | POA: Diagnosis not present

## 2024-09-02 DIAGNOSIS — S299XXA Unspecified injury of thorax, initial encounter: Secondary | ICD-10-CM | POA: Diagnosis not present

## 2024-09-02 DIAGNOSIS — E1165 Type 2 diabetes mellitus with hyperglycemia: Secondary | ICD-10-CM | POA: Diagnosis not present

## 2024-09-02 DIAGNOSIS — R296 Repeated falls: Secondary | ICD-10-CM | POA: Diagnosis not present

## 2024-09-02 DIAGNOSIS — S0990XA Unspecified injury of head, initial encounter: Secondary | ICD-10-CM | POA: Diagnosis not present

## 2024-09-02 DIAGNOSIS — Z9181 History of falling: Secondary | ICD-10-CM | POA: Diagnosis not present

## 2024-09-07 DIAGNOSIS — E039 Hypothyroidism, unspecified: Secondary | ICD-10-CM | POA: Diagnosis not present

## 2024-09-07 DIAGNOSIS — E7800 Pure hypercholesterolemia, unspecified: Secondary | ICD-10-CM | POA: Diagnosis not present

## 2024-09-07 DIAGNOSIS — I10 Essential (primary) hypertension: Secondary | ICD-10-CM | POA: Diagnosis not present

## 2024-09-07 DIAGNOSIS — N1831 Chronic kidney disease, stage 3a: Secondary | ICD-10-CM | POA: Diagnosis not present

## 2024-09-07 DIAGNOSIS — Z111 Encounter for screening for respiratory tuberculosis: Secondary | ICD-10-CM | POA: Diagnosis not present

## 2024-09-07 DIAGNOSIS — M797 Fibromyalgia: Secondary | ICD-10-CM | POA: Diagnosis not present

## 2024-09-07 DIAGNOSIS — E1165 Type 2 diabetes mellitus with hyperglycemia: Secondary | ICD-10-CM | POA: Diagnosis not present

## 2024-09-10 DIAGNOSIS — Z111 Encounter for screening for respiratory tuberculosis: Secondary | ICD-10-CM | POA: Diagnosis not present

## 2024-09-26 ENCOUNTER — Encounter (INDEPENDENT_AMBULATORY_CARE_PROVIDER_SITE_OTHER): Payer: Self-pay | Admitting: Gastroenterology
